# Patient Record
Sex: Male | Born: 1970
Health system: Southern US, Community
[De-identification: ages and names within clinical notes are randomized; demographics above are authoritative.]

## PROBLEM LIST (undated history)

## (undated) DIAGNOSIS — G8929 Other chronic pain: Secondary | ICD-10-CM

## (undated) DIAGNOSIS — Z87442 Personal history of urinary calculi: Secondary | ICD-10-CM

## (undated) DIAGNOSIS — E215 Disorder of parathyroid gland, unspecified: Secondary | ICD-10-CM

## (undated) DIAGNOSIS — I10 Essential (primary) hypertension: Secondary | ICD-10-CM

## (undated) DIAGNOSIS — R519 Headache, unspecified: Secondary | ICD-10-CM

## (undated) DIAGNOSIS — K219 Gastro-esophageal reflux disease without esophagitis: Secondary | ICD-10-CM

## (undated) DIAGNOSIS — Z9221 Personal history of antineoplastic chemotherapy: Secondary | ICD-10-CM

## (undated) DIAGNOSIS — D649 Anemia, unspecified: Secondary | ICD-10-CM

## (undated) DIAGNOSIS — K5900 Constipation, unspecified: Secondary | ICD-10-CM

## (undated) DIAGNOSIS — R011 Cardiac murmur, unspecified: Secondary | ICD-10-CM

## (undated) DIAGNOSIS — T7840XA Allergy, unspecified, initial encounter: Secondary | ICD-10-CM

## (undated) DIAGNOSIS — N189 Chronic kidney disease, unspecified: Secondary | ICD-10-CM

## (undated) DIAGNOSIS — E079 Disorder of thyroid, unspecified: Secondary | ICD-10-CM

## (undated) DIAGNOSIS — R51 Headache: Secondary | ICD-10-CM

## (undated) DIAGNOSIS — IMO0001 Reserved for inherently not codable concepts without codable children: Secondary | ICD-10-CM

## (undated) DIAGNOSIS — I2510 Atherosclerotic heart disease of native coronary artery without angina pectoris: Secondary | ICD-10-CM

## (undated) HISTORY — DX: Other chronic pain: G89.29

## (undated) HISTORY — DX: Anemia, unspecified: D64.9

## (undated) HISTORY — DX: Personal history of urinary calculi: Z87.442

## (undated) HISTORY — PX: AV FISTULA PLACEMENT: SHX1204

## (undated) HISTORY — DX: Chronic kidney disease, unspecified: N18.9

## (undated) HISTORY — DX: Disorder of thyroid, unspecified: E07.9

## (undated) HISTORY — DX: Cardiac murmur, unspecified: R01.1

## (undated) HISTORY — DX: Headache: R51

## (undated) HISTORY — DX: Allergy, unspecified, initial encounter: T78.40XA

## (undated) HISTORY — DX: Personal history of antineoplastic chemotherapy: Z92.21

## (undated) HISTORY — DX: Headache, unspecified: R51.9

## (undated) HISTORY — DX: Essential (primary) hypertension: I10

## (undated) HISTORY — PX: OTHER SURGICAL HISTORY: SHX169

---

## 2008-02-18 ENCOUNTER — Emergency Department (HOSPITAL_COMMUNITY): Admission: EM | Admit: 2008-02-18 | Discharge: 2008-02-18 | Payer: Self-pay | Admitting: Family Medicine

## 2009-03-18 ENCOUNTER — Encounter (INDEPENDENT_AMBULATORY_CARE_PROVIDER_SITE_OTHER): Payer: Self-pay | Admitting: Interventional Radiology

## 2009-03-18 ENCOUNTER — Ambulatory Visit (HOSPITAL_COMMUNITY): Admission: RE | Admit: 2009-03-18 | Discharge: 2009-03-18 | Payer: Self-pay | Admitting: Nephrology

## 2010-10-09 LAB — CBC
MCV: 95 fL (ref 78.0–100.0)
Platelets: 287 10*3/uL (ref 150–400)
RDW: 12.7 % (ref 11.5–15.5)
WBC: 9.2 10*3/uL (ref 4.0–10.5)

## 2010-10-09 LAB — PROTIME-INR: Prothrombin Time: 13.2 seconds (ref 11.6–15.2)

## 2010-10-23 IMAGING — US US BIOPSY
1 series · 10 of 10 positions shown · non-contrast
Comparison: none

Clinical Data/Indication: Proteinuria

ULTRASOUND-GUIDED BIOPSYRANDOM RENAL CORE
Sedation: Versed one mg, Fentanyl 50 mcg.
Total Moderate Sedation Time: A minutes.
Additional Medications: .
Procedure: The procedure, risks, benefits, and alternatives were
explained to the patient. Questions regarding the procedure were
encouraged and answered. The patient understands and consents to
the procedure.
The back was prepped with betadine in a sterile fashion, and a
sterile drape was applied covering the operative field. A sterile
gown and sterile gloves were used for the procedure.
Under sonographic guidance, two 16 gauge core biopsies were
obtained from the lower pole cortex of the left kidney. The guide
needle was removed. Final imaging was performed.
Patient tolerated the procedure well without complication.  Vital
sign monitoring by nursing staff during the procedure will continue
as patient is in the special procedures unit for post procedure
observation.

[Series 1: us biopsy · 0.28mm/px · 10 of 10 slices shown]
[im 1/10]
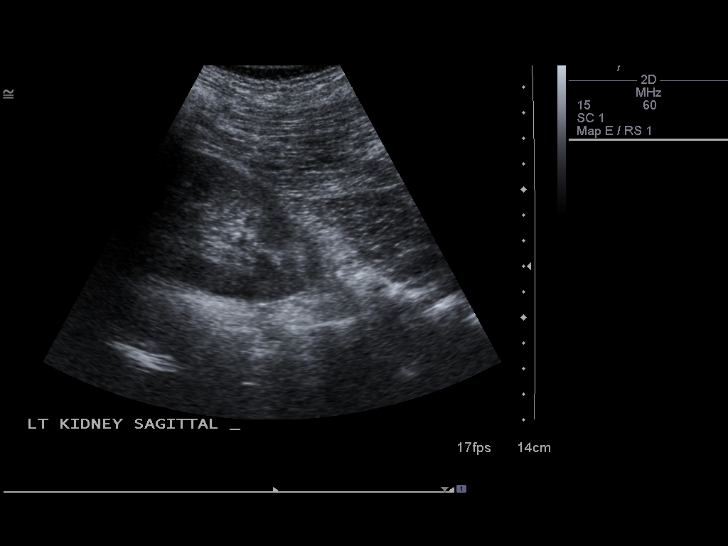
[im 2/10]
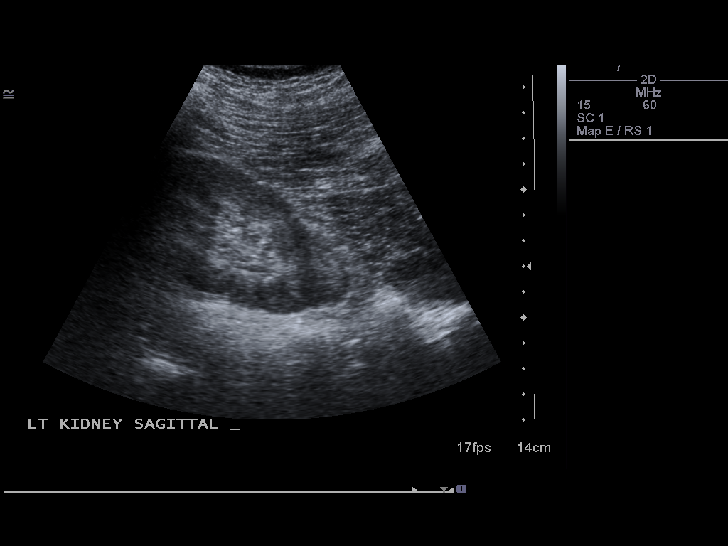
[im 3/10]
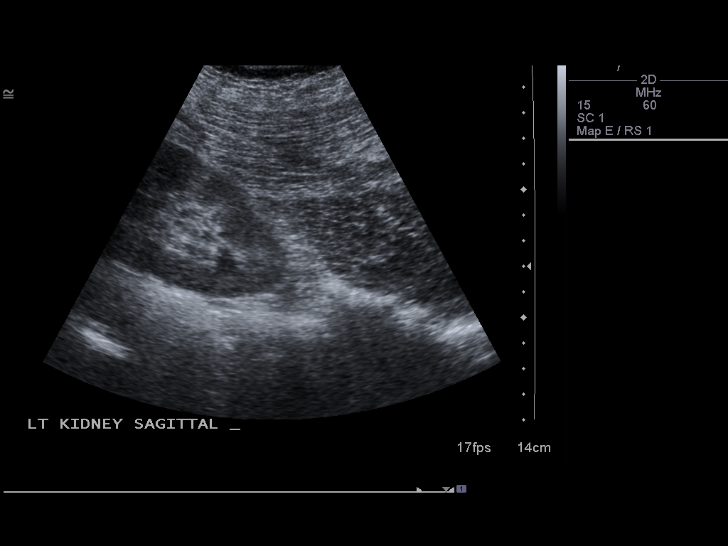
[im 4/10]
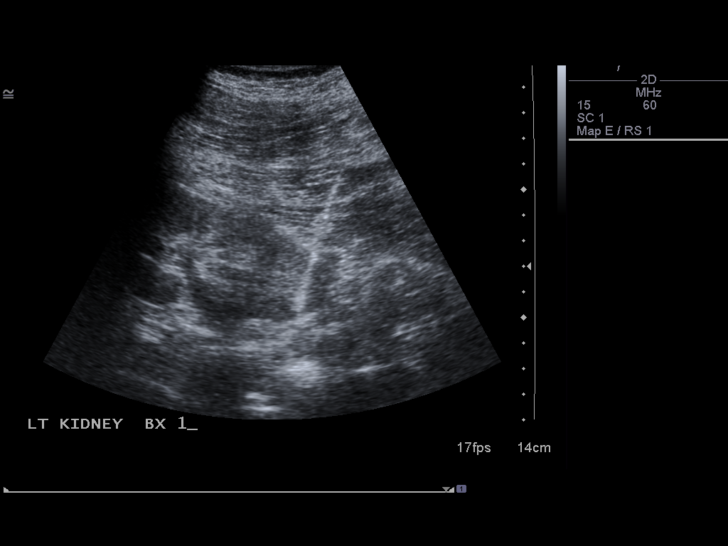
[im 5/10]
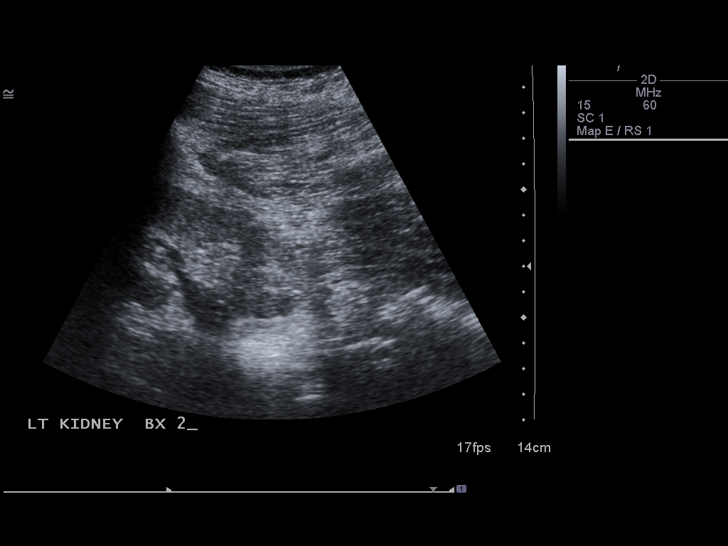
[im 6/10]
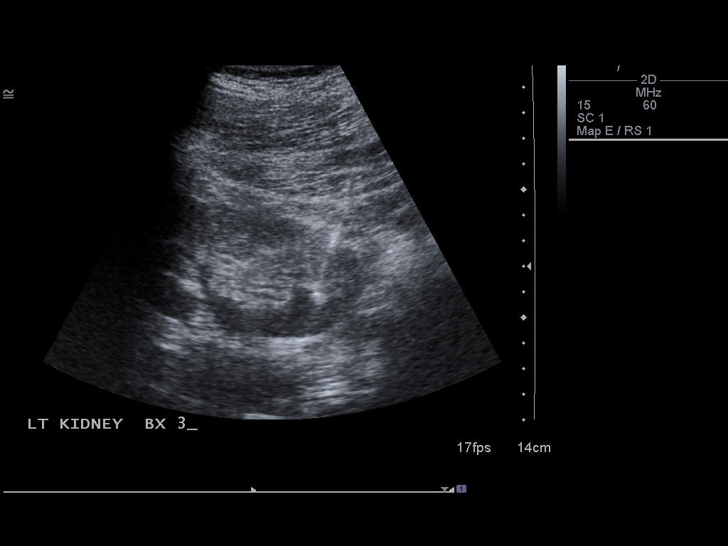
[im 7/10]
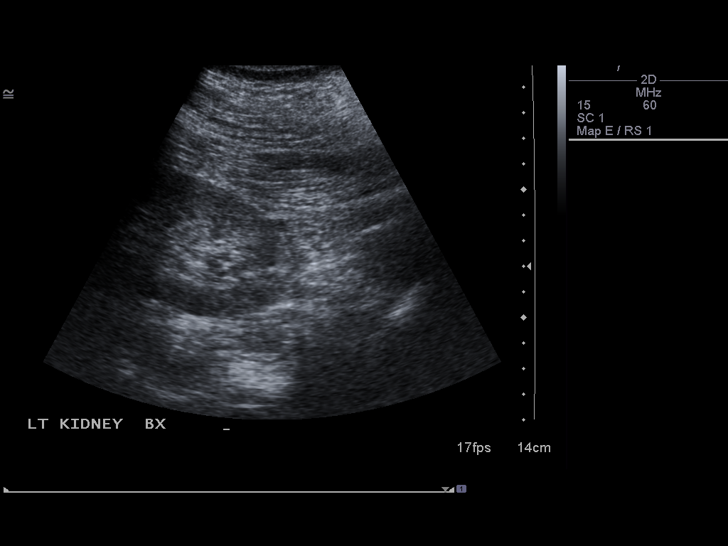
[im 8/10]
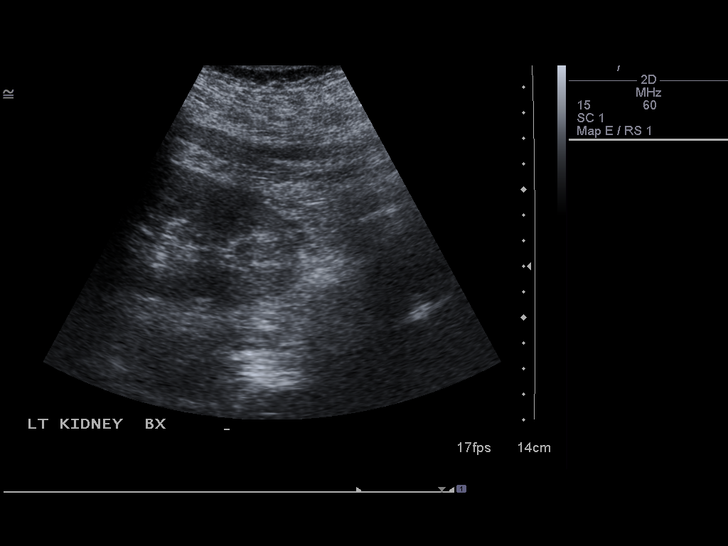
[im 9/10]
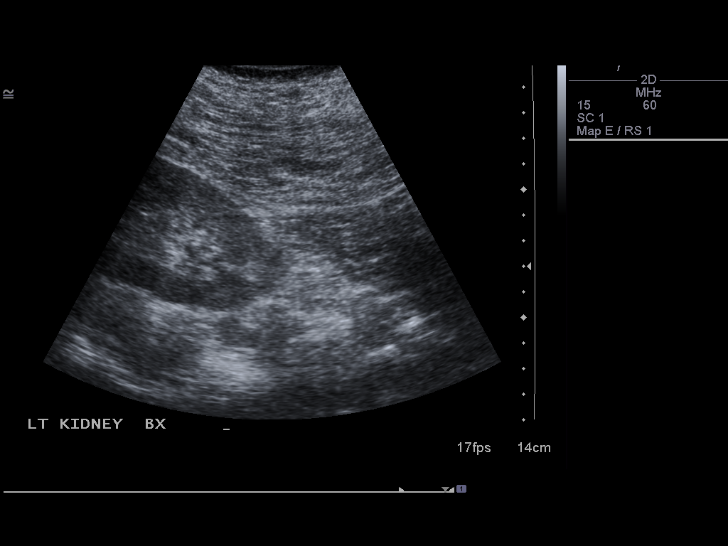
[im 10/10]
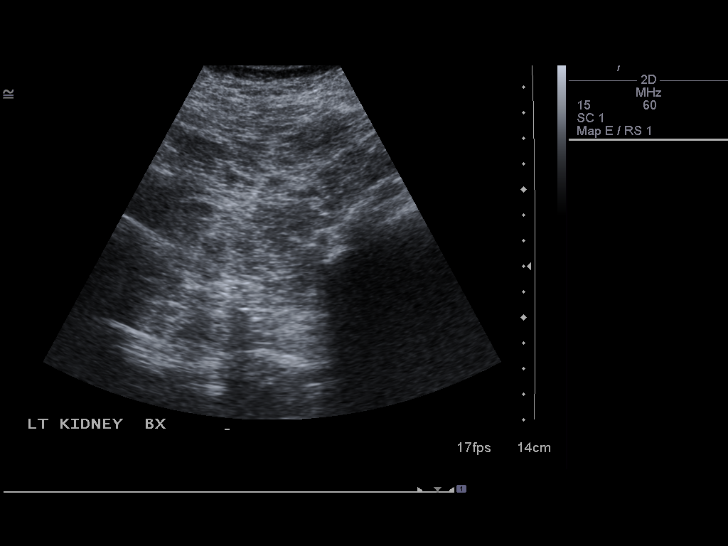

[10 of 10 positions shown; findings below may reference images not displayed]

FINDINGS: The images document guide needle placement within the
lower pole cortex of the left kidney. Post biopsy images
demonstrate minimal perinephric hemorrhage.
IMPRESSION: Successful ultrasound-guided random renal core biopsy.

## 2011-06-08 ENCOUNTER — Encounter: Payer: Self-pay | Admitting: Thoracic Diseases

## 2011-06-08 ENCOUNTER — Encounter: Payer: Self-pay | Admitting: Vascular Surgery

## 2011-06-09 ENCOUNTER — Ambulatory Visit (INDEPENDENT_AMBULATORY_CARE_PROVIDER_SITE_OTHER): Payer: BC Managed Care – PPO | Admitting: Thoracic Diseases

## 2011-06-09 ENCOUNTER — Encounter: Payer: Self-pay | Admitting: Thoracic Diseases

## 2011-06-09 VITALS — BP 151/90 | HR 105 | Resp 18 | Ht 74.0 in | Wt 175.0 lb

## 2011-06-09 DIAGNOSIS — N186 End stage renal disease: Secondary | ICD-10-CM | POA: Insufficient documentation

## 2011-06-09 DIAGNOSIS — Z992 Dependence on renal dialysis: Secondary | ICD-10-CM

## 2011-06-09 NOTE — Progress Notes (Signed)
[facility]  H&P NOTE 06/09/2011 [id number]  OZ:[id number] button hole site on AVF Referring Physician:Dr. [facility] History of Present Illness: Christopher Ritter is a 40 y.o. male who has a functioning left brachio cephalic AVF which was placed at the beginning of this year. They have been using the button hole technique to access the fistula.   approx 2-3 days ago the upper site continued to swell. It has had a 2 mm scab over it but has not been bleeding. They have been accessing through this site and it is now enlarging. We will be revising this area to prevent further bleeding which could be  life threatening .   Past Medical History  Diagnosis Date  . Hypertension   . Chronic kidney disease   . Chronic headaches     posterior occipital headaches  . Heart murmur   . ED (erectile dysfunction)   . History of renal calculi   . Allergy   . Anemia     Iron deficiency  . Thyroid disease   . History of Cytoxan exposure     ROS: [x]  Positive   [ ]  Negative   [ ]  All sytems reviewed and are negative  General: [ ]  Weight loss, [ ]  Fever, [ ]  chills Neurologic: [ ]  Dizziness, [ ]  Blackouts, [ ]  Seizure [ ]  Stroke, [ ]  "Mini stroke", [ ]  Slurred speech, [ ]  Temporary blindness; [ ]  weakness in arms or legs, [ ]  Hoarseness Cardiac: [ ]  Chest pain/pressure, [ ]  Shortness of breath at rest [ ]  Shortness of breath with exertion, [ ]  Atrial fibrillation or irregular heartbeat Vascular: [ ]  Pain in legs with walking, [ ]  Pain in legs at rest, [ ]  Pain in legs at night,  [ ]  Non-healing ulcer, [ ]  Blood clot in vein/DVT,   Pulmonary: [ ]  Home oxygen, [ ]  Productive cough, [ ]  Coughing up blood, [ ]  Asthma,  [ ]  Wheezing Musculoskeletal:  [ ]  Arthritis, [ ]  Low back pain, [ ]  Joint pain Hematologic: [ ]  Easy Bruising, [ ]  Anemia; [ ]  Hepatitis Gastrointestinal: [ ]  Blood in stool, [ ]  Gastroesophageal Reflux/heartburn, [ ]  Trouble  swallowing Urinary: [x ] chronic Kidney disease, [x ] on HD - [x ] MWF or [ ]  TTHS, [ ]  Burning with urination, [ ]  Difficulty urinating Skin: [ ]  Rashes, [ ]  Wounds Psychological: [ ]  Anxiety, [ ]  Depression  Social History History  Substance Use Topics  . Smoking status: Never Smoker   . Smokeless tobacco: Never Used  . Alcohol Use: Yes    Family History No family history on file.  Allergies  Allergen Reactions  . Lisinopril Cough    Current Outpatient Prescriptions  Medication Sig Dispense Refill  . amLODipine (NORVASC) 10 MG tablet Take 10 mg by mouth daily.        Marland Kitchen b complex-vitamin c-folic acid (NEPHRO-VITE) 0.8 MG TABS Take 0.8 mg by mouth at bedtime.        . calcium acetate (PHOSLO) 667 MG capsule Take 667 mg by mouth 3 (three) times daily with meals.        Marland Kitchen ethyl chloride spray Apply topically as needed.        Marland Kitchen HYDROcodone-acetaminophen (VICODIN) 5-500 MG per tablet Take 1 tablet by mouth every 6 (six) hours as needed.        Marland Kitchen losartan (COZAAR) 50 MG tablet Take 50 mg by mouth daily.        Marland Kitchen  Omega-3 Fatty Acids (FISH OIL) 1000 MG CAPS Take 1,000 mg by mouth daily.        Marland Kitchen oxyCODONE (OXYCONTIN) 15 MG TB12 Take 15 mg by mouth 2 (two) times daily.        . simvastatin (ZOCOR) 20 MG tablet Take 20 mg by mouth at bedtime.        . naproxen sodium (ANAPROX) 220 MG tablet Take 220 mg by mouth 2 (two) times daily with a meal.          Significant Diagnostic Studies: CBC    Component Value Date/Time   WBC 9.2 03/18/2009 0826   RBC 3.62* 03/18/2009 0826   HGB 11.7* 03/18/2009 0826   HCT 34.4* 03/18/2009 0826   PLT 287 03/18/2009 0826   MCV 95.0 03/18/2009 0826   MCHC 34.0 03/18/2009 0826   RDW 12.7 03/18/2009 0826     COAG Lab Results  Component Value Date   INR 1.0 03/18/2009   No results found for this basename: PTT     Physical Examination  Filed Vitals:   06/09/11 1522  BP: 151/90  Pulse: 105  Resp: 18  Height: 6\' 2"  (1.88 m)  Weight: 175 lb  (79.379 kg)    General:  WDWN in NAD Gait: Normal HENT: WNL Eyes: Pupils equal Pulmonary: normal non-labored breathing , without Rales, rhonchi,  wheezing Cardiac: RRR, with 4/6 SEM  Abdomen: soft, NT, no masses Skin: no rashes, ulcers noted Vascular Exam/Pulses:2 + radial pulse LUE Good thrill and bruit in the AVF, LUE  There is a 2x2 mm scab over proximal button hole site. There is no bleeding and the scab remains intact Extremities without ischemic changes, no Gangrene , no cellulitis; no open wounds;  Musculoskeletal: no muscle wasting or atrophy  Neurologic: A&O X 3; Appropriate Affect ;  SENSATION: normal; MOTOR FUNCTION:  moving all extremities equally.  Speech is fluent/normal   ASSESSMENT: pulsitile area with large scab in left Brachiocephalic AVF secondary to repeated cannulation through button hole technique PLAN: Revise area of upper left BC -AVF  Clinic MD: CS Edilia Bo, MD

## 2011-06-10 ENCOUNTER — Other Ambulatory Visit: Payer: Self-pay | Admitting: *Deleted

## 2011-06-16 MED ORDER — DEXTROSE 5 % IV SOLN
1.5000 g | INTRAVENOUS | Status: DC
  Filled 2011-06-16: qty 1.5

## 2011-06-16 MED ORDER — SODIUM CHLORIDE 0.9 % IV SOLN: INTRAVENOUS | Status: DC

## 2011-06-16 NOTE — Progress Notes (Signed)
Left message for Darel Hong at Dr. Adele Dan office requesting verification that surgery is cancelled. Per pharmacy tech's note on 12/11 pt states surgery was cancelled.

## 2011-06-17 ENCOUNTER — Ambulatory Visit (HOSPITAL_COMMUNITY)
Admission: RE | Admit: 2011-06-17 | Discharge: 2011-06-17 | Disposition: A | Discharge status: Home / Self Care | Payer: BC Managed Care – PPO | Source: Ambulatory Visit | Attending: Vascular Surgery | Admitting: Vascular Surgery

## 2011-06-17 ENCOUNTER — Encounter (HOSPITAL_COMMUNITY)
Admission: RE | Disposition: A | Discharge status: Home / Self Care | Payer: Self-pay | Source: Ambulatory Visit | Attending: Vascular Surgery

## 2011-06-17 ENCOUNTER — Ambulatory Visit: Payer: Self-pay | Admitting: Vascular Surgery

## 2011-06-17 SURGERY — ARTERIOVENOUS (AV) FISTULA CREATION
Anesthesia: Monitor Anesthesia Care | Site: Arm Upper | Laterality: Left

## 2011-06-21 ENCOUNTER — Encounter: Payer: Self-pay | Admitting: Vascular Surgery

## 2011-06-22 ENCOUNTER — Ambulatory Visit: Payer: BC Managed Care – PPO | Admitting: Vascular Surgery

## 2011-06-23 ENCOUNTER — Other Ambulatory Visit: Payer: Self-pay | Admitting: *Deleted

## 2011-06-23 ENCOUNTER — Encounter (HOSPITAL_COMMUNITY): Payer: Self-pay

## 2011-07-07 ENCOUNTER — Encounter (HOSPITAL_COMMUNITY): Payer: Self-pay | Admitting: *Deleted

## 2011-07-08 ENCOUNTER — Ambulatory Visit (HOSPITAL_COMMUNITY): Payer: BC Managed Care – PPO

## 2011-07-08 ENCOUNTER — Ambulatory Visit (HOSPITAL_COMMUNITY)
Admission: RE | Admit: 2011-07-08 | Discharge: 2011-07-08 | Disposition: A | Discharge status: Home / Self Care | Payer: BC Managed Care – PPO | Source: Ambulatory Visit | Attending: Vascular Surgery | Admitting: Vascular Surgery

## 2011-07-08 ENCOUNTER — Encounter (HOSPITAL_COMMUNITY): Payer: Self-pay | Admitting: Critical Care Medicine

## 2011-07-08 ENCOUNTER — Ambulatory Visit (HOSPITAL_COMMUNITY): Payer: BC Managed Care – PPO | Admitting: Critical Care Medicine

## 2011-07-08 ENCOUNTER — Encounter (HOSPITAL_COMMUNITY): Payer: Self-pay | Admitting: *Deleted

## 2011-07-08 ENCOUNTER — Encounter (HOSPITAL_COMMUNITY)
Admission: RE | Disposition: A | Discharge status: Home / Self Care | Payer: Self-pay | Source: Ambulatory Visit | Attending: Vascular Surgery

## 2011-07-08 ENCOUNTER — Other Ambulatory Visit: Payer: Self-pay

## 2011-07-08 DIAGNOSIS — T82898A Other specified complication of vascular prosthetic devices, implants and grafts, initial encounter: Secondary | ICD-10-CM | POA: Insufficient documentation

## 2011-07-08 DIAGNOSIS — D649 Anemia, unspecified: Secondary | ICD-10-CM | POA: Diagnosis not present

## 2011-07-08 DIAGNOSIS — Z992 Dependence on renal dialysis: Secondary | ICD-10-CM

## 2011-07-08 DIAGNOSIS — Y832 Surgical operation with anastomosis, bypass or graft as the cause of abnormal reaction of the patient, or of later complication, without mention of misadventure at the time of the procedure: Secondary | ICD-10-CM | POA: Insufficient documentation

## 2011-07-08 DIAGNOSIS — T82897A Other specified complication of cardiac prosthetic devices, implants and grafts, initial encounter: Secondary | ICD-10-CM | POA: Diagnosis not present

## 2011-07-08 DIAGNOSIS — I120 Hypertensive chronic kidney disease with stage 5 chronic kidney disease or end stage renal disease: Secondary | ICD-10-CM | POA: Insufficient documentation

## 2011-07-08 DIAGNOSIS — N186 End stage renal disease: Secondary | ICD-10-CM | POA: Insufficient documentation

## 2011-07-08 DIAGNOSIS — I10 Essential (primary) hypertension: Secondary | ICD-10-CM | POA: Diagnosis not present

## 2011-07-08 HISTORY — PX: THROMBECTOMY W/ EMBOLECTOMY: SHX2507

## 2011-07-08 HISTORY — DX: Atherosclerotic heart disease of native coronary artery without angina pectoris: I25.10

## 2011-07-08 HISTORY — DX: Gastro-esophageal reflux disease without esophagitis: K21.9

## 2011-07-08 LAB — POCT I-STAT 4, (NA,K, GLUC, HGB,HCT)
Glucose, Bld: 99 mg/dL (ref 70–99)
HCT: 39 % (ref 39.0–52.0)
Hemoglobin: 13.3 g/dL (ref 13.0–17.0)
Potassium: 4.6 mEq/L (ref 3.5–5.1)
Sodium: 139 mEq/L (ref 135–145)

## 2011-07-08 LAB — PROTIME-INR: Prothrombin Time: 13.6 seconds (ref 11.6–15.2)

## 2011-07-08 LAB — SURGICAL PCR SCREEN: Staphylococcus aureus: NEGATIVE

## 2011-07-08 SURGERY — THROMBECTOMY ARTERIOVENOUS FISTULA
Anesthesia: Monitor Anesthesia Care | Site: Arm Upper | Laterality: Left

## 2011-07-08 MED ORDER — SODIUM CHLORIDE 0.9 % IV SOLN
INTRAVENOUS | Status: DC | PRN
  Administered 2011-07-08: 07:00:00 via INTRAVENOUS

## 2011-07-08 MED ORDER — MUPIROCIN 2 % EX OINT
TOPICAL_OINTMENT | CUTANEOUS | Status: AC
  Filled 2011-07-08: qty 22

## 2011-07-08 MED ORDER — ONDANSETRON HCL 4 MG/2ML IJ SOLN
INTRAMUSCULAR | Status: DC | PRN
  Administered 2011-07-08: 4 mg via INTRAVENOUS

## 2011-07-08 MED ORDER — MIDAZOLAM HCL 2 MG/2ML IJ SOLN: 0.5000 mg | Freq: Once | INTRAMUSCULAR | Status: DC | PRN

## 2011-07-08 MED ORDER — MEPERIDINE HCL 25 MG/ML IJ SOLN: 6.2500 mg | INTRAMUSCULAR | Status: DC | PRN

## 2011-07-08 MED ORDER — GLYCOPYRROLATE 0.2 MG/ML IJ SOLN
INTRAMUSCULAR | Status: DC | PRN
  Administered 2011-07-08: 0.1 mg via INTRAVENOUS

## 2011-07-08 MED ORDER — MORPHINE SULFATE 2 MG/ML IJ SOLN: 0.0500 mg/kg | INTRAMUSCULAR | Status: DC | PRN

## 2011-07-08 MED ORDER — HYDROCODONE-ACETAMINOPHEN 5-500 MG PO TABS: 1.0000 | ORAL_TABLET | ORAL | Status: DC | PRN

## 2011-07-08 MED ORDER — CEFAZOLIN SODIUM 1-5 GM-% IV SOLN
INTRAVENOUS | Status: DC | PRN
  Administered 2011-07-08: 1 g via INTRAVENOUS

## 2011-07-08 MED ORDER — PROMETHAZINE HCL 25 MG/ML IJ SOLN: 6.2500 mg | INTRAMUSCULAR | Status: DC | PRN

## 2011-07-08 MED ORDER — SODIUM CHLORIDE 0.9 % IR SOLN
Status: DC | PRN
  Administered 2011-07-08: 1000 mL

## 2011-07-08 MED ORDER — PROPOFOL 10 MG/ML IV EMUL
INTRAVENOUS | Status: DC | PRN
  Administered 2011-07-08: 100 ug/kg/min via INTRAVENOUS

## 2011-07-08 MED ORDER — SODIUM CHLORIDE 0.9 % IR SOLN
Status: DC | PRN
  Administered 2011-07-08: 08:00:00

## 2011-07-08 MED ORDER — LIDOCAINE-EPINEPHRINE (PF) 1 %-1:200000 IJ SOLN
INTRAMUSCULAR | Status: DC | PRN
  Administered 2011-07-08: 12 mL

## 2011-07-08 MED ORDER — HYDROMORPHONE HCL PF 1 MG/ML IJ SOLN: 0.2500 mg | INTRAMUSCULAR | Status: DC | PRN

## 2011-07-08 MED ORDER — HYDROCODONE-ACETAMINOPHEN 5-325 MG PO TABS: 1.0000 | ORAL_TABLET | ORAL | Status: DC | PRN

## 2011-07-08 MED ORDER — MIDAZOLAM HCL 5 MG/5ML IJ SOLN
INTRAMUSCULAR | Status: DC | PRN
  Administered 2011-07-08: 2 mg via INTRAVENOUS

## 2011-07-08 MED ORDER — FENTANYL CITRATE 0.05 MG/ML IJ SOLN
INTRAMUSCULAR | Status: DC | PRN
  Administered 2011-07-08 (×2): 50 ug via INTRAVENOUS

## 2011-07-08 MED ORDER — CEFAZOLIN SODIUM 1-5 GM-% IV SOLN
INTRAVENOUS | Status: AC
  Filled 2011-07-08: qty 50

## 2011-07-08 SURGICAL SUPPLY — 39 items
CANISTER SUCTION 2500CC (MISCELLANEOUS) ×2 IMPLANT
CATH EMB 5FR 80CM (CATHETERS) ×2 IMPLANT
CLIP TI MEDIUM 6 (CLIP) ×2 IMPLANT
CLIP TI WIDE RED SMALL 6 (CLIP) ×2 IMPLANT
CLOTH BEACON ORANGE TIMEOUT ST (SAFETY) ×2 IMPLANT
COVER SURGICAL LIGHT HANDLE (MISCELLANEOUS) ×4 IMPLANT
DERMABOND ADHESIVE PROPEN (GAUZE/BANDAGES/DRESSINGS) ×1
DERMABOND ADVANCED (GAUZE/BANDAGES/DRESSINGS) ×1
DERMABOND ADVANCED .7 DNX12 (GAUZE/BANDAGES/DRESSINGS) ×1 IMPLANT
DERMABOND ADVANCED .7 DNX6 (GAUZE/BANDAGES/DRESSINGS) ×1 IMPLANT
ELECT REM PT RETURN 9FT ADLT (ELECTROSURGICAL) ×2
ELECTRODE REM PT RTRN 9FT ADLT (ELECTROSURGICAL) ×1 IMPLANT
GEL ULTRASOUND 20GR AQUASONIC (MISCELLANEOUS) IMPLANT
GLOVE BIO SURGEON STRL SZ 6.5 (GLOVE) ×2 IMPLANT
GLOVE BIOGEL PI IND STRL 6.5 (GLOVE) ×2 IMPLANT
GLOVE BIOGEL PI IND STRL 7.0 (GLOVE) ×3 IMPLANT
GLOVE BIOGEL PI IND STRL 7.5 (GLOVE) ×1 IMPLANT
GLOVE BIOGEL PI INDICATOR 6.5 (GLOVE) ×2
GLOVE BIOGEL PI INDICATOR 7.0 (GLOVE) ×3
GLOVE BIOGEL PI INDICATOR 7.5 (GLOVE) ×1
GLOVE SS BIOGEL STRL SZ 7 (GLOVE) ×1 IMPLANT
GLOVE SUPERSENSE BIOGEL SZ 7 (GLOVE) ×1
GLOVE SURG SS PI 7.5 STRL IVOR (GLOVE) ×2 IMPLANT
GOWN PREVENTION PLUS XLARGE (GOWN DISPOSABLE) ×4 IMPLANT
GOWN STRL NON-REIN LRG LVL3 (GOWN DISPOSABLE) ×6 IMPLANT
KIT BASIN OR (CUSTOM PROCEDURE TRAY) ×2 IMPLANT
KIT ROOM TURNOVER OR (KITS) ×2 IMPLANT
NS IRRIG 1000ML POUR BTL (IV SOLUTION) ×2 IMPLANT
PACK CV ACCESS (CUSTOM PROCEDURE TRAY) ×2 IMPLANT
PAD ARMBOARD 7.5X6 YLW CONV (MISCELLANEOUS) ×4 IMPLANT
SPONGE GAUZE 4X4 12PLY (GAUZE/BANDAGES/DRESSINGS) IMPLANT
SUT PROLENE 6 0 BV (SUTURE) ×2 IMPLANT
SUT VIC AB 3-0 SH 27 (SUTURE) ×2
SUT VIC AB 3-0 SH 27X BRD (SUTURE) ×2 IMPLANT
SUT VICRYL 4-0 PS2 18IN ABS (SUTURE) ×2 IMPLANT
TOWEL OR 17X24 6PK STRL BLUE (TOWEL DISPOSABLE) ×2 IMPLANT
TOWEL OR 17X26 10 PK STRL BLUE (TOWEL DISPOSABLE) ×2 IMPLANT
UNDERPAD 30X30 INCONTINENT (UNDERPADS AND DIAPERS) ×2 IMPLANT
WATER STERILE IRR 1000ML POUR (IV SOLUTION) ×2 IMPLANT

## 2011-07-08 NOTE — Op Note (Signed)
OPERATIVE REPORT  Date of Surgery: 07/08/2011  Surgeon: Josephina Gip, MD  Assistant: Della Goo  Pre-op Diagnosis: End Stage Renal Disease with ulcerated lesion overlying functioning left upper arm AV fistula Post-op Diagnosis: Same Procedure: Procedure(s) Excision of ulcerated skin overlying functioning left upper arm AV fistula with revision of fistula Anesthesia:   EBL: Minimal  Complications: None  Procedure Details: Patient was taken to the operating room placed in supine position at which time the left upper extremity was prepped with Betadine scrub and solution and draped in routine sterile manner. There was an ulcerated lesion overlying the most proximal portion of the left upper arm AV fistula (brachial/cephalic). This ulcerated area was approximately 7 mm in diameter and was pulsatile. After infiltration with 1% solid and epinephrine both proximal and distal to this area a short incision was made proximal control of the fistula. Similarly distal control was obtained an elliptical incision was made around this ulcerated area which was not infected nor purulent in appearance with no erythema. This was dissected down to the fistula. There was a well-formed track up to the skin level. The fistula was occluded proximally and distally with vascular clamps and this  fistulous tract was excised and the lateral wall of the fistula repaired with 2 layers of 6-0 Prolene. Clamps were then released and there was an excellent pulse and thrill remaining in the fistula the skin was then mobilized medially and laterally and was easily reapproximated with interrupted 3-0 Vicryl and oral Vicryl in subcuticular fashion with Dermabond. Patient taken to recovery in stable condition  Josephina Gip, MD 07/08/2011 8:41 AM        2

## 2011-07-08 NOTE — H&P (View-Only) (Signed)
[facility] [place] H&P NOTE 06/09/2011 10/23/1970 [id number]  CC:Enlarging button hole site on AVF Referring Physician:Dr. Dunham [facility] History of Present Illness: Christopher Ritter is a 40 y.o. male who has a functioning left brachio cephalic AVF which was placed at the beginning of this year. They have been using the button hole technique to access the fistula.   approx 2-3 days ago the upper site continued to swell. It has had a 2 mm scab over it but has not been bleeding. They have been accessing through this site and it is now enlarging. We will be revising this area to prevent further bleeding which could be  life threatening .   Past Medical History  Diagnosis Date  . Hypertension   . Chronic kidney disease   . Chronic headaches     posterior occipital headaches  . Heart murmur   . ED (erectile dysfunction)   . History of renal calculi   . Allergy   . Anemia     Iron deficiency  . Thyroid disease   . History of Cytoxan exposure     ROS: [x] Positive   [ ] Negative   [ ] All sytems reviewed and are negative  General: [ ] Weight loss, [ ] Fever, [ ] chills Neurologic: [ ] Dizziness, [ ] Blackouts, [ ] Seizure [ ] Stroke, [ ] "Mini stroke", [ ] Slurred speech, [ ] Temporary blindness; [ ] weakness in arms or legs, [ ] Hoarseness Cardiac: [ ] Chest pain/pressure, [ ] Shortness of breath at rest [ ] Shortness of breath with exertion, [ ] Atrial fibrillation or irregular heartbeat Vascular: [ ] Pain in legs with walking, [ ] Pain in legs at rest, [ ] Pain in legs at night,  [ ] Non-healing ulcer, [ ] Blood clot in vein/DVT,   Pulmonary: [ ] Home oxygen, [ ] Productive cough, [ ] Coughing up blood, [ ] Asthma,  [ ] Wheezing Musculoskeletal:  [ ] Arthritis, [ ] Low back pain, [ ] Joint pain Hematologic: [ ] Easy Bruising, [ ] Anemia; [ ] Hepatitis Gastrointestinal: [ ] Blood in stool, [ ] Gastroesophageal Reflux/heartburn, [ ] Trouble  swallowing Urinary: [x ] chronic Kidney disease, [x ] on HD - [x ] MWF or [ ] TTHS, [ ] Burning with urination, [ ] Difficulty urinating Skin: [ ] Rashes, [ ] Wounds Psychological: [ ] Anxiety, [ ] Depression  Social History History  Substance Use Topics  . Smoking status: Never Smoker   . Smokeless tobacco: Never Used  . Alcohol Use: Yes    Family History No family history on file.  Allergies  Allergen Reactions  . Lisinopril Cough    Current Outpatient Prescriptions  Medication Sig Dispense Refill  . amLODipine (NORVASC) 10 MG tablet Take 10 mg by mouth daily.        . b complex-vitamin c-folic acid (NEPHRO-VITE) 0.8 MG TABS Take 0.8 mg by mouth at bedtime.        . calcium acetate (PHOSLO) 667 MG capsule Take 667 mg by mouth 3 (three) times daily with meals.        . ethyl chloride spray Apply topically as needed.        . HYDROcodone-acetaminophen (VICODIN) 5-500 MG per tablet Take 1 tablet by mouth every 6 (six) hours as needed.        . losartan (COZAAR) 50 MG tablet Take 50 mg by mouth daily.        .   Omega-3 Fatty Acids (FISH OIL) 1000 MG CAPS Take 1,000 mg by mouth daily.        . oxyCODONE (OXYCONTIN) 15 MG TB12 Take 15 mg by mouth 2 (two) times daily.        . simvastatin (ZOCOR) 20 MG tablet Take 20 mg by mouth at bedtime.        . naproxen sodium (ANAPROX) 220 MG tablet Take 220 mg by mouth 2 (two) times daily with a meal.          Significant Diagnostic Studies: CBC    Component Value Date/Time   WBC 9.2 03/18/2009 0826   RBC 3.62* 03/18/2009 0826   HGB 11.7* 03/18/2009 0826   HCT 34.4* 03/18/2009 0826   PLT 287 03/18/2009 0826   MCV 95.0 03/18/2009 0826   MCHC 34.0 03/18/2009 0826   RDW 12.7 03/18/2009 0826     COAG Lab Results  Component Value Date   INR 1.0 03/18/2009   No results found for this basename: PTT     Physical Examination  Filed Vitals:   06/09/11 1522  BP: 151/90  Pulse: 105  Resp: 18  Height: 6' 2" (1.88 m)  Weight: 175 lb  (79.379 kg)    General:  WDWN in NAD Gait: Normal HENT: WNL Eyes: Pupils equal Pulmonary: normal non-labored breathing , without Rales, rhonchi,  wheezing Cardiac: RRR, with 4/6 SEM  Abdomen: soft, NT, no masses Skin: no rashes, ulcers noted Vascular Exam/Pulses:2 + radial pulse LUE Good thrill and bruit in the AVF, LUE  There is a 2x2 mm scab over proximal button hole site. There is no bleeding and the scab remains intact Extremities without ischemic changes, no Gangrene , no cellulitis; no open wounds;  Musculoskeletal: no muscle wasting or atrophy  Neurologic: A&O X 3; Appropriate Affect ;  SENSATION: normal; MOTOR FUNCTION:  moving all extremities equally.  Speech is fluent/normal   ASSESSMENT: pulsitile area with large scab in left Brachiocephalic AVF secondary to repeated cannulation through button hole technique PLAN: Revise area of upper left BC -AVF  Clinic MD: CS Dickson, MD 

## 2011-07-08 NOTE — Interval H&P Note (Signed)
History and Physical Interval Note:  07/08/2011 7:36 AM  Christopher Ritter  has presented today for surgery, with the diagnosis of ESRD  The various methods of treatment have been discussed with the patient and family. After consideration of risks, benefits and other options for treatment, the patient has consented to  Procedure(s): THROMBECTOMY ARTERIOVENOUS FISTULA as a surgical intervention .  The patients' history has been reviewed, patient examined, no change in status, stable for surgery.  I have reviewed the patients' chart and labs.  Questions were answered to the patient's satisfaction.     LAWSON,JAMES D

## 2011-07-08 NOTE — Transfer of Care (Signed)
Immediate Anesthesia Transfer of Care Note  Patient: Christopher Ritter  Procedure(s) Performed:  THROMBECTOMY ARTERIOVENOUS FISTULA - Incision of Ulcerated Skin and Revision of Left Upper Arm Fistula  Patient Location: PACU  Anesthesia Type: MAC  Level of Consciousness: awake, alert  and oriented  Airway & Oxygen Therapy: Patient Spontanous Breathing and Patient connected to nasal cannula oxygen  Post-op Assessment: Report given to PACU RN and Post -op Vital signs reviewed and stable  Post vital signs: Reviewed and stable  Complications: No apparent anesthesia complications

## 2011-07-08 NOTE — Anesthesia Postprocedure Evaluation (Signed)
  Anesthesia Post-op Note  Patient: Christopher Ritter  Procedure(s) Performed:  THROMBECTOMY ARTERIOVENOUS FISTULA - Incision of Ulcerated Skin and Revision of Left Upper Arm Fistula  Patient Location: PACU  Anesthesia Type: MAC  Level of Consciousness: awake  Airway and Oxygen Therapy: Patient Spontanous Breathing  Post-op Pain: mild  Post-op Assessment: Post-op Vital signs reviewed  Post-op Vital Signs: stable  Complications: No apparent anesthesia complications

## 2011-07-08 NOTE — Anesthesia Postprocedure Evaluation (Signed)
  Anesthesia Post-op Note  Patient: Christopher Ritter  Procedure(s) Performed:  THROMBECTOMY ARTERIOVENOUS FISTULA - Incision of Ulcerated Skin and Revision of Left Upper Arm Fistula  Patient Location: PACU  Anesthesia Type: MAC  Level of Consciousness: awake, alert  and oriented  Airway and Oxygen Therapy: Patient Spontanous Breathing  Post-op Pain: none  Post-op Assessment: Post-op Vital signs reviewed, Patient's Cardiovascular Status Stable, Respiratory Function Stable, Patent Airway, No signs of Nausea or vomiting, Adequate PO intake and Pain level controlled  Post-op Vital Signs: Reviewed and stable  Complications: No apparent anesthesia complications

## 2011-07-08 NOTE — Preoperative (Signed)
Beta Blockers   Reason not to administer Beta Blockers:Not Applicable 

## 2011-07-08 NOTE — Progress Notes (Signed)
PATIENT REFUSES TO BE TREATED WITH MUPIRICIN  AT THIS TIME........WANTS TO WAIT UNTIL THE RESULTS COME BACK.........Marland Kitchen

## 2011-07-08 NOTE — Anesthesia Preprocedure Evaluation (Addendum)
Anesthesia Evaluation  Patient identified by MRN, date of birth, ID band Patient awake    Reviewed: Allergy & Precautions, H&P , NPO status , Patient's Chart, lab work & pertinent test results  Airway Mallampati: II TM Distance: >3 FB Neck ROM: Full    Dental  (+) Teeth Intact and Dental Advisory Given   Pulmonary          Cardiovascular hypertension, Pt. on medications + CAD + Valvular Problems/Murmurs MR and MVP     Neuro/Psych  Headaches,    GI/Hepatic GERD-  ,  Endo/Other    Renal/GU Dialysis and ESRFRenal disease     Musculoskeletal   Abdominal   Peds  Hematology   Anesthesia Other Findings   Reproductive/Obstetrics                        Anesthesia Physical Anesthesia Plan  ASA: III  Anesthesia Plan: MAC   Post-op Pain Management:    Induction: Intravenous  Airway Management Planned: Mask  Additional Equipment:   Intra-op Plan:   Post-operative Plan:   Informed Consent:   Plan Discussed with: CRNA  Anesthesia Plan Comments:         Anesthesia Quick Evaluation

## 2011-07-09 ENCOUNTER — Encounter (HOSPITAL_COMMUNITY): Payer: Self-pay | Admitting: Vascular Surgery

## 2011-07-09 MED FILL — Mupirocin Oint 2%: CUTANEOUS | Qty: 22 | Status: AC

## 2011-07-12 DIAGNOSIS — Z7682 Awaiting organ transplant status: Secondary | ICD-10-CM | POA: Diagnosis not present

## 2011-07-12 DIAGNOSIS — N186 End stage renal disease: Secondary | ICD-10-CM | POA: Diagnosis not present

## 2011-07-12 DIAGNOSIS — Z01818 Encounter for other preprocedural examination: Secondary | ICD-10-CM | POA: Diagnosis not present

## 2011-07-27 ENCOUNTER — Ambulatory Visit: Payer: BC Managed Care – PPO | Admitting: Vascular Surgery

## 2011-07-28 ENCOUNTER — Encounter: Payer: Self-pay | Admitting: Physician Assistant

## 2011-07-29 ENCOUNTER — Encounter: Payer: Self-pay | Admitting: Physician Assistant

## 2011-07-29 ENCOUNTER — Ambulatory Visit (INDEPENDENT_AMBULATORY_CARE_PROVIDER_SITE_OTHER): Payer: BC Managed Care – PPO | Admitting: Physician Assistant

## 2011-07-29 VITALS — BP 159/110 | HR 97 | Resp 18 | Ht 74.0 in | Wt 168.5 lb

## 2011-07-29 DIAGNOSIS — T82898A Other specified complication of vascular prosthetic devices, implants and grafts, initial encounter: Secondary | ICD-10-CM | POA: Insufficient documentation

## 2011-07-29 DIAGNOSIS — N186 End stage renal disease: Secondary | ICD-10-CM | POA: Insufficient documentation

## 2011-07-29 NOTE — Progress Notes (Signed)
[facility] [place] Postoperative Visit hemodialysis access   Date of Surgery:  07/08/11 Surgeon: Abby Potash HD:  yes HD:  Days:  M/W/F   CC: f/u for excision of ulcerated skin overlying functioning LUA AVF with revision of fistula   HPI:  This is a 41 y.o. male who returns today s/p  excision of ulcerated skin overlying functioning LUA AVF with revision of fistula on 07/08/11.  He states that he has been doing quite well since surgery.  They are able to use his fistula without difficulty.  He denies any sx of steal.  PHYSICAL EXAMINATION:  Filed Vitals:   07/29/11 1606  BP: 159/110  Pulse: 97  Resp: 18     Incision is c/d/i Hand grip is equal bilaterally and sensation in digits is intact; There is  Thrill; there is bruit. The graft/fistula is easily palpable  Pulse:  + palpable left radial pulse  ASSESSMENT/PLAN:  EMIT KUENZEL is a 41 y.o. year old male who presents s/p excision of ulcerated skin overlying functioning LUA AVF with revision of fistula on 07/08/11. -he is doing well and his AVF is functioning just fine with HD. -he will return to see [place] on a PRN basis.  Newton Pigg, PA-C [facility] [phone]  Clinic MD:   Darrick Penna

## 2011-09-02 DIAGNOSIS — N186 End stage renal disease: Secondary | ICD-10-CM | POA: Diagnosis not present

## 2011-09-08 DIAGNOSIS — Z01818 Encounter for other preprocedural examination: Secondary | ICD-10-CM | POA: Diagnosis not present

## 2011-09-08 DIAGNOSIS — Z7682 Awaiting organ transplant status: Secondary | ICD-10-CM | POA: Diagnosis not present

## 2011-09-08 DIAGNOSIS — N186 End stage renal disease: Secondary | ICD-10-CM | POA: Diagnosis not present

## 2011-10-06 DIAGNOSIS — N058 Unspecified nephritic syndrome with other morphologic changes: Secondary | ICD-10-CM | POA: Diagnosis not present

## 2012-03-29 ENCOUNTER — Encounter: Payer: Self-pay | Admitting: Vascular Surgery

## 2012-03-29 ENCOUNTER — Ambulatory Visit (INDEPENDENT_AMBULATORY_CARE_PROVIDER_SITE_OTHER): Payer: Medicare Other | Admitting: Vascular Surgery

## 2012-03-29 ENCOUNTER — Telehealth: Payer: Self-pay

## 2012-03-29 ENCOUNTER — Encounter (INDEPENDENT_AMBULATORY_CARE_PROVIDER_SITE_OTHER): Payer: Medicare Other | Admitting: *Deleted

## 2012-03-29 VITALS — BP 175/96 | HR 117 | Resp 18 | Ht 75.0 in | Wt 168.0 lb

## 2012-03-29 DIAGNOSIS — T82898A Other specified complication of vascular prosthetic devices, implants and grafts, initial encounter: Secondary | ICD-10-CM

## 2012-03-29 DIAGNOSIS — N186 End stage renal disease: Secondary | ICD-10-CM | POA: Diagnosis not present

## 2012-03-29 NOTE — Telephone Encounter (Signed)
Discussed symptoms w/ Dr. Edilia Bo.  Advised to bring in today with (L) UE doppler and office visit.

## 2012-03-29 NOTE — Progress Notes (Signed)
[facility] [place]  Patient name: Christopher Ritter MRN: [id number] DOB: 1970/10/26 Sex: male  REASON FOR VISIT: left shoulder pain and question swelling over left upper arm AV fistula.  HPI: Christopher Ritter is a 41 y.o. male who was seen as an add-on today because of a complaint of left shoulder pain and swelling over the central portion of his AV fistula. Patient states that he has had pain when he elevates his arm which is been going on for several weeks. He denies any arm swelling on the left. He does not remember any specific injury to the left arm. He is scheduled to see an orthopedic surgeon tomorrow. In addition he was concerned about an area with a small amount of swelling over the central fistula. He states that he is not noted any drainage or erythema in this area. He denies any history of fever or chills. The fistula has been working well.   REVIEW OF SYSTEMS: Arly.Keller ] denotes positive finding; [  ] denotes negative finding  CARDIOVASCULAR:  [ ]  chest pain   [ ]  dyspnea on exertion    CONSTITUTIONAL:  [ ]  fever   [ ]  chills  PHYSICAL EXAM: Filed Vitals:   03/29/12 1645  BP: 175/96  Pulse: 117  Resp: 18  Height: 6\' 3"  (1.905 m)  Weight: 168 lb (76.204 kg)   Body mass index is 21.00 kg/(m^2). GENERAL: The patient is a well-nourished male, in no acute distress. The vital signs are documented above. CARDIOVASCULAR: There is a regular rate and rhythm  PULMONARY: There is good air exchange bilaterally without wheezing or rales. The left upper arm AV fistula has an excellent thrill. It is somewhat tortuous and slightly pulsatile. There is some very mild swelling over the central portion of the fistula but I do not see any obvious purulence or cellulitis.  I have independently interpreted the duplex scan of the fistula which shows a patent left brachiocephalic fistula with no areas of stenosis noted. There is one small competing branches noted. Outflow was noted to be  tortuous distally. Diameters range from 0.79 to 2.0 cm. Deficit are reasonable at 0.23-0.41 cm.  MEDICAL ISSUES:  End stage renal disease I do not think that this patient's left shoulder pain is related to his AV fistula. He has had no arm swelling on the left. He is scheduled to see an orthopedic doctor tomorrow. Concerning an area of swelling over his fistula,, currently I do not see any cellulitis or purulence to suggest infection. However, I think it would be wise to not cannulate near this area for several weeks and to keep a close eye on this. If he develops erythema or increased swelling in this area I think this should be explored in the operating room. He will call if he develops increased swelling over the fistula, erythema, fever, or drainage.   Ormand Senn S [facility] [place] Beeper: [phone]

## 2012-03-29 NOTE — Telephone Encounter (Signed)
Wife called to report that pt. Having difficulty lifting (L) arm above waist level.  Describes a "grape-size swelling over (L) UA fistula site that has a white pus-type appearance in center of it".  Denies any fevers/ chills today, and states "he did have fever a couple of weeks ago."  Also relates recent hx. Of having difficulty moving/lifting the right arm, and was evaluated per his PCP, and referred to an Orthopedic MD; states has appt. 9/26 with Orthopedics.   Wife stated she doesn't want pt. To take dialysis tx. Until his left arm access is evaluated.  Requesting appt. Today.

## 2012-03-29 NOTE — Assessment & Plan Note (Signed)
I do not think that this patient's left shoulder pain is related to his AV fistula. He has had no arm swelling on the left. He is scheduled to see an orthopedic doctor tomorrow. Concerning an area of swelling over his fistula,, currently I do not see any cellulitis or purulence to suggest infection. However, I think it would be wise to not cannulate near this area for several weeks and to keep a close eye on this. If he develops erythema or increased swelling in this area I think this should be explored in the operating room. He will call if he develops increased swelling over the fistula, erythema, fever, or drainage.

## 2012-08-04 DIAGNOSIS — N186 End stage renal disease: Secondary | ICD-10-CM | POA: Diagnosis not present

## 2012-08-05 DIAGNOSIS — J209 Acute bronchitis, unspecified: Secondary | ICD-10-CM | POA: Diagnosis not present

## 2012-08-30 DIAGNOSIS — M87059 Idiopathic aseptic necrosis of unspecified femur: Secondary | ICD-10-CM | POA: Diagnosis not present

## 2012-08-30 DIAGNOSIS — Z79899 Other long term (current) drug therapy: Secondary | ICD-10-CM | POA: Diagnosis not present

## 2012-08-30 DIAGNOSIS — E079 Disorder of thyroid, unspecified: Secondary | ICD-10-CM | POA: Diagnosis not present

## 2012-08-30 DIAGNOSIS — M87029 Idiopathic aseptic necrosis of unspecified humerus: Secondary | ICD-10-CM | POA: Diagnosis not present

## 2012-09-05 ENCOUNTER — Other Ambulatory Visit: Payer: Self-pay | Admitting: Anesthesiology

## 2012-09-05 DIAGNOSIS — R609 Edema, unspecified: Secondary | ICD-10-CM

## 2012-09-07 ENCOUNTER — Other Ambulatory Visit: Payer: BC Managed Care – PPO

## 2012-09-11 ENCOUNTER — Ambulatory Visit
Admission: RE | Admit: 2012-09-11 | Discharge: 2012-09-11 | Disposition: A | Discharge status: Home / Self Care | Payer: BC Managed Care – PPO | Source: Ambulatory Visit | Attending: Anesthesiology | Admitting: Anesthesiology

## 2012-09-11 DIAGNOSIS — R609 Edema, unspecified: Secondary | ICD-10-CM

## 2012-10-17 DIAGNOSIS — M87059 Idiopathic aseptic necrosis of unspecified femur: Secondary | ICD-10-CM | POA: Diagnosis not present

## 2012-10-17 DIAGNOSIS — M87029 Idiopathic aseptic necrosis of unspecified humerus: Secondary | ICD-10-CM | POA: Diagnosis not present

## 2012-10-17 DIAGNOSIS — E079 Disorder of thyroid, unspecified: Secondary | ICD-10-CM | POA: Diagnosis not present

## 2012-11-22 ENCOUNTER — Encounter: Payer: Self-pay | Admitting: Vascular Surgery

## 2012-11-22 ENCOUNTER — Other Ambulatory Visit: Payer: Self-pay | Admitting: *Deleted

## 2012-11-22 DIAGNOSIS — T82598A Other mechanical complication of other cardiac and vascular devices and implants, initial encounter: Secondary | ICD-10-CM

## 2012-11-23 ENCOUNTER — Encounter: Payer: Self-pay | Admitting: Vascular Surgery

## 2012-11-23 ENCOUNTER — Ambulatory Visit (INDEPENDENT_AMBULATORY_CARE_PROVIDER_SITE_OTHER): Payer: Medicare Other | Admitting: Vascular Surgery

## 2012-11-23 ENCOUNTER — Other Ambulatory Visit: Payer: Self-pay

## 2012-11-23 ENCOUNTER — Encounter (INDEPENDENT_AMBULATORY_CARE_PROVIDER_SITE_OTHER): Payer: Medicare Other | Admitting: *Deleted

## 2012-11-23 VITALS — BP 148/97 | HR 87 | Ht 75.0 in | Wt 151.8 lb

## 2012-11-23 DIAGNOSIS — T82598A Other mechanical complication of other cardiac and vascular devices and implants, initial encounter: Secondary | ICD-10-CM

## 2012-11-23 DIAGNOSIS — N186 End stage renal disease: Secondary | ICD-10-CM | POA: Diagnosis not present

## 2012-11-23 NOTE — Progress Notes (Signed)
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 The patient previously had a repair similar to this by Dr. Lawson several months ago. He has had no bleeding episodes. The fistula has been in place since 2010. He currently dialyzes in [place] Monday Wednesday Friday.  Review of systems: He denies shortness of breath. He denies chest pain.  Physical exam:  Filed Vitals:   11/23/12 1039  BP: 148/97  Pulse: 87  Height: 6' 3" (1.905 m)  Weight: 151 lb 12.8 oz (68.856 kg)  SpO2: 100%   Left upper extremity. He has multiple areas in the proximal third to midportion of the fistula of small pseudoaneurysms. There is one proximally that is very shiny and thinned out. It is pulsatile. There is a well-healed scar at the distal third of the fistula. There is mild tortuosity. There is a palpable 2+ left radial pulse.  Data: The patient had a duplex exam of his fistula today. There is no significant narrowing. There were multiple areas of degeneration. I reviewed and interpreted this study.  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 Plan: Plication of the mid section of his fistula on 11/28/2012. He may require dialysis catheter at that time depending on how much fistula after repair. He will discuss with his dialysis center whether or not they feel they will be able to cannulate the fistula above and below the planned area of repair. Risks benefits possible complications and procedure details the fistula repair were explained to the patient today he understands and agrees to proceed.  Hjalmar Ballengee, MD [facility] Office: [phone] Pager: [phone]  

## 2012-11-24 ENCOUNTER — Encounter (HOSPITAL_COMMUNITY): Payer: Self-pay | Admitting: *Deleted

## 2012-11-24 ENCOUNTER — Encounter (HOSPITAL_COMMUNITY): Payer: Self-pay | Admitting: Pharmacy Technician

## 2012-11-27 MED ORDER — SODIUM CHLORIDE 0.9 % IV SOLN
INTRAVENOUS | Status: DC
  Administered 2012-11-28: 10:00:00 via INTRAVENOUS

## 2012-11-27 MED ORDER — CEFUROXIME SODIUM 1.5 G IJ SOLR
1.5000 g | INTRAMUSCULAR | Status: AC
  Administered 2012-11-28: 1.5 g via INTRAVENOUS
  Filled 2012-11-27: qty 1.5

## 2012-11-28 ENCOUNTER — Encounter (HOSPITAL_COMMUNITY)
Admission: RE | Disposition: A | Discharge status: Home / Self Care | Payer: Self-pay | Source: Ambulatory Visit | Attending: Vascular Surgery

## 2012-11-28 ENCOUNTER — Encounter (HOSPITAL_COMMUNITY): Payer: Self-pay | Admitting: Certified Registered"

## 2012-11-28 ENCOUNTER — Ambulatory Visit (HOSPITAL_COMMUNITY): Payer: Medicare Other | Admitting: Certified Registered"

## 2012-11-28 ENCOUNTER — Encounter (HOSPITAL_COMMUNITY): Payer: Self-pay | Admitting: *Deleted

## 2012-11-28 ENCOUNTER — Ambulatory Visit (HOSPITAL_COMMUNITY)
Admission: RE | Admit: 2012-11-28 | Discharge: 2012-11-28 | Disposition: A | Discharge status: Home / Self Care | Payer: Medicare Other | Source: Ambulatory Visit | Attending: Vascular Surgery | Admitting: Vascular Surgery

## 2012-11-28 ENCOUNTER — Ambulatory Visit (HOSPITAL_COMMUNITY): Payer: Medicare Other

## 2012-11-28 ENCOUNTER — Telehealth: Payer: Self-pay | Admitting: Vascular Surgery

## 2012-11-28 DIAGNOSIS — I120 Hypertensive chronic kidney disease with stage 5 chronic kidney disease or end stage renal disease: Secondary | ICD-10-CM | POA: Diagnosis not present

## 2012-11-28 DIAGNOSIS — N189 Chronic kidney disease, unspecified: Secondary | ICD-10-CM | POA: Diagnosis not present

## 2012-11-28 DIAGNOSIS — D649 Anemia, unspecified: Secondary | ICD-10-CM | POA: Insufficient documentation

## 2012-11-28 DIAGNOSIS — T82898A Other specified complication of vascular prosthetic devices, implants and grafts, initial encounter: Secondary | ICD-10-CM

## 2012-11-28 DIAGNOSIS — IMO0002 Reserved for concepts with insufficient information to code with codable children: Secondary | ICD-10-CM | POA: Insufficient documentation

## 2012-11-28 DIAGNOSIS — N186 End stage renal disease: Secondary | ICD-10-CM | POA: Diagnosis not present

## 2012-11-28 DIAGNOSIS — Z01818 Encounter for other preprocedural examination: Secondary | ICD-10-CM | POA: Diagnosis not present

## 2012-11-28 DIAGNOSIS — Z992 Dependence on renal dialysis: Secondary | ICD-10-CM | POA: Diagnosis not present

## 2012-11-28 DIAGNOSIS — I721 Aneurysm of artery of upper extremity: Secondary | ICD-10-CM | POA: Diagnosis not present

## 2012-11-28 DIAGNOSIS — R011 Cardiac murmur, unspecified: Secondary | ICD-10-CM | POA: Diagnosis not present

## 2012-11-28 DIAGNOSIS — I10 Essential (primary) hypertension: Secondary | ICD-10-CM | POA: Diagnosis not present

## 2012-11-28 DIAGNOSIS — Y832 Surgical operation with anastomosis, bypass or graft as the cause of abnormal reaction of the patient, or of later complication, without mention of misadventure at the time of the procedure: Secondary | ICD-10-CM | POA: Insufficient documentation

## 2012-11-28 DIAGNOSIS — K219 Gastro-esophageal reflux disease without esophagitis: Secondary | ICD-10-CM | POA: Insufficient documentation

## 2012-11-28 HISTORY — DX: Constipation, unspecified: K59.00

## 2012-11-28 HISTORY — PX: REVISON OF ARTERIOVENOUS FISTULA: SHX6074

## 2012-11-28 LAB — POCT I-STAT 4, (NA,K, GLUC, HGB,HCT)
Glucose, Bld: 95 mg/dL (ref 70–99)
Hemoglobin: 13.3 g/dL (ref 13.0–17.0)

## 2012-11-28 LAB — SURGICAL PCR SCREEN: MRSA, PCR: NEGATIVE

## 2012-11-28 SURGERY — REVISON OF ARTERIOVENOUS FISTULA
Anesthesia: Monitor Anesthesia Care | Site: Neck | Wound class: Clean

## 2012-11-28 MED ORDER — PROPOFOL INFUSION 10 MG/ML OPTIME
INTRAVENOUS | Status: DC | PRN
  Administered 2012-11-28: 100 ug/kg/min via INTRAVENOUS

## 2012-11-28 MED ORDER — LIDOCAINE HCL 1 % IJ SOLN
INTRAMUSCULAR | Status: DC | PRN
  Administered 2012-11-28: 30 mL

## 2012-11-28 MED ORDER — 0.9 % SODIUM CHLORIDE (POUR BTL) OPTIME
TOPICAL | Status: DC | PRN
  Administered 2012-11-28: 1000 mL

## 2012-11-28 MED ORDER — MIDAZOLAM HCL 5 MG/5ML IJ SOLN
INTRAMUSCULAR | Status: DC | PRN
  Administered 2012-11-28: 2 mg via INTRAVENOUS

## 2012-11-28 MED ORDER — ONDANSETRON HCL 4 MG/2ML IJ SOLN
INTRAMUSCULAR | Status: DC | PRN
  Administered 2012-11-28: 4 mg via INTRAVENOUS

## 2012-11-28 MED ORDER — SODIUM CHLORIDE 0.9 % IV SOLN
INTRAVENOUS | Status: DC | PRN
  Administered 2012-11-28 (×2): via INTRAVENOUS

## 2012-11-28 MED ORDER — CLONIDINE HCL 0.2 MG PO TABS: 0.2000 mg | ORAL_TABLET | ORAL | Status: DC | PRN

## 2012-11-28 MED ORDER — HYDROMORPHONE HCL PF 1 MG/ML IJ SOLN
0.2500 mg | INTRAMUSCULAR | Status: DC | PRN
  Administered 2012-11-28 (×2): 0.5 mg via INTRAVENOUS

## 2012-11-28 MED ORDER — THROMBIN 20000 UNITS EX SOLR
CUTANEOUS | Status: AC
  Filled 2012-11-28: qty 20000

## 2012-11-28 MED ORDER — HEPARIN SODIUM (PORCINE) 1000 UNIT/ML IJ SOLN
INTRAMUSCULAR | Status: DC | PRN
  Administered 2012-11-28: 5 mL via INTRAVENOUS

## 2012-11-28 MED ORDER — FENTANYL CITRATE 0.05 MG/ML IJ SOLN
INTRAMUSCULAR | Status: DC | PRN
  Administered 2012-11-28: 50 ug via INTRAVENOUS

## 2012-11-28 MED ORDER — OXYCODONE HCL 5 MG PO TABS
5.0000 mg | ORAL_TABLET | ORAL | Status: DC | PRN
  Administered 2012-11-28: 5 mg via ORAL

## 2012-11-28 MED ORDER — HYDROMORPHONE HCL PF 1 MG/ML IJ SOLN
INTRAMUSCULAR | Status: AC
  Filled 2012-11-28: qty 1

## 2012-11-28 MED ORDER — AMLODIPINE BESYLATE 10 MG PO TABS
10.0000 mg | ORAL_TABLET | Freq: Every day | ORAL | Status: DC
Start: 2012-11-29 — End: 2012-11-28

## 2012-11-28 MED ORDER — SODIUM CHLORIDE 0.9 % IR SOLN
Status: DC | PRN
  Administered 2012-11-28: 12:00:00

## 2012-11-28 MED ORDER — CLONAZEPAM 2 MG PO TABS: 2.0000 mg | ORAL_TABLET | Freq: Every day | ORAL | Status: DC

## 2012-11-28 MED ORDER — MUPIROCIN 2 % EX OINT
TOPICAL_OINTMENT | CUTANEOUS | Status: AC
  Administered 2012-11-28: 1 via NASAL
  Filled 2012-11-28: qty 22

## 2012-11-28 MED ORDER — OXYCODONE HCL 5 MG PO TABS
ORAL_TABLET | ORAL | Status: AC
  Filled 2012-11-28: qty 1

## 2012-11-28 MED ORDER — PROTAMINE SULFATE 10 MG/ML IV SOLN
INTRAVENOUS | Status: DC | PRN
  Administered 2012-11-28: 50 mg via INTRAVENOUS

## 2012-11-28 MED ORDER — AMLODIPINE BESYLATE 10 MG PO TABS
10.0000 mg | ORAL_TABLET | ORAL | Status: AC
  Administered 2012-11-28: 10 mg via ORAL
  Filled 2012-11-28: qty 1

## 2012-11-28 MED ORDER — OXYCODONE-ACETAMINOPHEN 5-325 MG PO TABS: 1.0000 | ORAL_TABLET | ORAL | Status: DC | PRN

## 2012-11-28 MED ORDER — LIDOCAINE HCL (PF) 1 % IJ SOLN
INTRAMUSCULAR | Status: AC
  Filled 2012-11-28: qty 30

## 2012-11-28 MED ORDER — MUPIROCIN 2 % EX OINT
TOPICAL_OINTMENT | Freq: Two times a day (BID) | CUTANEOUS | Status: DC
  Administered 2012-11-28: 1 via NASAL
  Filled 2012-11-28: qty 22

## 2012-11-28 SURGICAL SUPPLY — 63 items
BAG DECANTER FOR FLEXI CONT (MISCELLANEOUS) ×3 IMPLANT
BANDAGE GAUZE ELAST BULKY 4 IN (GAUZE/BANDAGES/DRESSINGS) ×3 IMPLANT
CANISTER SUCTION 2500CC (MISCELLANEOUS) ×3 IMPLANT
CATH CANNON HEMO 15F 50CM (CATHETERS) IMPLANT
CATH CANNON HEMO 15FR 19 (HEMODIALYSIS SUPPLIES) IMPLANT
CATH CANNON HEMO 15FR 23CM (HEMODIALYSIS SUPPLIES) IMPLANT
CATH CANNON HEMO 15FR 31CM (HEMODIALYSIS SUPPLIES) IMPLANT
CATH CANNON HEMO 15FR 32CM (HEMODIALYSIS SUPPLIES) IMPLANT
CATH STRAIGHT 5FR 65CM (CATHETERS) IMPLANT
CHLORAPREP W/TINT 26ML (MISCELLANEOUS) IMPLANT
CLIP TI MEDIUM 6 (CLIP) ×3 IMPLANT
CLIP TI WIDE RED SMALL 6 (CLIP) ×3 IMPLANT
CLOTH BEACON ORANGE TIMEOUT ST (SAFETY) ×3 IMPLANT
COVER PROBE W GEL 5X96 (DRAPES) ×3 IMPLANT
COVER SURGICAL LIGHT HANDLE (MISCELLANEOUS) ×3 IMPLANT
DECANTER SPIKE VIAL GLASS SM (MISCELLANEOUS) IMPLANT
DERMABOND ADVANCED (GAUZE/BANDAGES/DRESSINGS) ×1
DERMABOND ADVANCED .7 DNX12 (GAUZE/BANDAGES/DRESSINGS) ×2 IMPLANT
DRAIN PENROSE 1/4X12 LTX STRL (WOUND CARE) ×3 IMPLANT
DRAPE C-ARM 42X72 X-RAY (DRAPES) IMPLANT
DRAPE CHEST BREAST 15X10 FENES (DRAPES) IMPLANT
ELECT REM PT RETURN 9FT ADLT (ELECTROSURGICAL) ×3
ELECTRODE REM PT RTRN 9FT ADLT (ELECTROSURGICAL) ×2 IMPLANT
GAUZE SPONGE 4X4 16PLY XRAY LF (GAUZE/BANDAGES/DRESSINGS) IMPLANT
GEL ULTRASOUND 20GR AQUASONIC (MISCELLANEOUS) IMPLANT
GLOVE BIO SURGEON STRL SZ 6.5 (GLOVE) ×6 IMPLANT
GLOVE BIO SURGEON STRL SZ7.5 (GLOVE) ×3 IMPLANT
GLOVE BIOGEL PI IND STRL 6.5 (GLOVE) ×2 IMPLANT
GLOVE BIOGEL PI IND STRL 7.0 (GLOVE) ×6 IMPLANT
GLOVE BIOGEL PI INDICATOR 6.5 (GLOVE) ×1
GLOVE BIOGEL PI INDICATOR 7.0 (GLOVE) ×3
GLOVE ECLIPSE 6.5 STRL STRAW (GLOVE) ×3 IMPLANT
GLOVE ECLIPSE 7.0 STRL STRAW (GLOVE) ×3 IMPLANT
GOWN PREVENTION PLUS XLARGE (GOWN DISPOSABLE) ×3 IMPLANT
GOWN STRL NON-REIN LRG LVL3 (GOWN DISPOSABLE) ×12 IMPLANT
KIT BASIN OR (CUSTOM PROCEDURE TRAY) ×3 IMPLANT
KIT ROOM TURNOVER OR (KITS) ×3 IMPLANT
LOOP VESSEL MINI RED (MISCELLANEOUS) IMPLANT
NEEDLE 18GX1X1/2 (RX/OR ONLY) (NEEDLE) IMPLANT
NEEDLE HYPO 25GX1X1/2 BEV (NEEDLE) ×3 IMPLANT
NS IRRIG 1000ML POUR BTL (IV SOLUTION) ×3 IMPLANT
PACK CV ACCESS (CUSTOM PROCEDURE TRAY) ×3 IMPLANT
PACK SURGICAL SETUP 50X90 (CUSTOM PROCEDURE TRAY) IMPLANT
PAD ARMBOARD 7.5X6 YLW CONV (MISCELLANEOUS) ×6 IMPLANT
SET MICROPUNCTURE 5F STIFF (MISCELLANEOUS) IMPLANT
SPONGE SURGIFOAM ABS GEL 100 (HEMOSTASIS) IMPLANT
SUT ETHILON 3 0 PS 1 (SUTURE) ×3 IMPLANT
SUT PROLENE 5 0 C 1 24 (SUTURE) ×12 IMPLANT
SUT PROLENE 7 0 BV 1 (SUTURE) ×3 IMPLANT
SUT VIC AB 3-0 SH 27 (SUTURE) ×1
SUT VIC AB 3-0 SH 27X BRD (SUTURE) ×2 IMPLANT
SUT VICRYL 4-0 PS2 18IN ABS (SUTURE) ×3 IMPLANT
SYR 20CC LL (SYRINGE) IMPLANT
SYR 30ML LL (SYRINGE) IMPLANT
SYR 5ML LL (SYRINGE) IMPLANT
SYR CONTROL 10ML LL (SYRINGE) ×3 IMPLANT
SYRINGE 10CC LL (SYRINGE) IMPLANT
TAPE CLOTH SURG 4X10 WHT LF (GAUZE/BANDAGES/DRESSINGS) ×3 IMPLANT
TOWEL OR 17X24 6PK STRL BLUE (TOWEL DISPOSABLE) ×3 IMPLANT
TOWEL OR 17X26 10 PK STRL BLUE (TOWEL DISPOSABLE) ×3 IMPLANT
UNDERPAD 30X30 INCONTINENT (UNDERPADS AND DIAPERS) ×3 IMPLANT
WATER STERILE IRR 1000ML POUR (IV SOLUTION) ×3 IMPLANT
WIRE AMPLATZ SS-J .035X180CM (WIRE) IMPLANT

## 2012-11-28 NOTE — Progress Notes (Signed)
Dressing starting showing blood again thorough dressing. Dr. Darrick Penna notified will see patient prior to discharge.

## 2012-11-28 NOTE — Interval H&P Note (Signed)
History and Physical Interval Note:  11/28/2012 11:33 AM  Christopher Ritter  has presented today for surgery, with the diagnosis of End Stage Renal Disease  The various methods of treatment have been discussed with the patient and family. After consideration of risks, benefits and other options for treatment, the patient has consented to  Procedure(s) with comments: REVISON OF ARTERIOVENOUS FISTULA (Left) - Plication of left arm AVF INSERTION OF DIALYSIS CATHETER (N/A) as a surgical intervention .  The patient's history has been reviewed, patient examined, no change in status, stable for surgery.  I have reviewed the patient's chart and labs.  Questions were answered to the patient's satisfaction.     [name]

## 2012-11-28 NOTE — Transfer of Care (Signed)
Immediate Anesthesia Transfer of Care Note  Patient: Christopher Ritter  Procedure(s) Performed: Procedure(s) with comments: REVISON OF ARTERIOVENOUS FISTULA (Left) - Plication   Patient Location: PACU  Anesthesia Type:MAC  Level of Consciousness: awake, alert  and oriented  Airway & Oxygen Therapy: Patient Spontanous Breathing and Patient connected to nasal cannula oxygen  Post-op Assessment: Report given to PACU RN, Post -op Vital signs reviewed and stable and Patient moving all extremities X 4  Post vital signs: Reviewed and stable  Complications: No apparent anesthesia complications

## 2012-11-28 NOTE — Op Note (Signed)
Procedure: Plication of left upper extremity AV fistula  Preoperative diagnosis: Aneurysmal degeneration left arm AV fistula   Postoperative diagnosis: Same  Anesthesia: Local with sedation  Assistant: Doreatha Massed PA-C  Operative findings: #1 plication of proximal and middle third of left upper extremity AV fistula        Operative details: After obtaining informed consent, the patient was taken to the operating room. The patient was placed in supine position on the operating room table.    Next attention was turned to the left upper extremity. The entire left upper extremity was prepped and draped in usual sterile fashion. Local anesthesia was infiltrated over the proximal half of the fistula.  A longitudinal incision was made over the central third of the fistula incorporating several areas of aneurysmal degeneration. The fistula was dissected free circumferentially throughout its course. The more proximal aneurysm was multilobulated and very thin walled. The more distal aneurysm was smaller. The patient was given 5000 units of intravenous heparin. The fistula was clamped proximally and distally above and below the aneurysms. The aneurysm was opened longitudinally and the redundant segment fistula excised. The area of resection was then reapproximated using a running 5-0 Prolene suture. The clamps were removed and flow restored to the fistula. Hemostasis was obtained with administration of 50 mg of protamine and 3 repair sutures.The subcutaneous tissues were reapproximated using a running 3-0 Vicryl suture. The skin was closed with a 4 0 Vicryl subcuticular stitch. Dermabond was applied to the skin incision.  The patient tolerated procedure well and there were no complications. Instrument sponge and needle counts were correct at the end of the case. The patient was taken to the recovery room in stable condition.   Fabienne Bruns, MD [facility] Office:  [phone] Pager: [phone]

## 2012-11-28 NOTE — H&P (View-Only) (Signed)
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 The patient previously had a repair similar to this by Dr. Hart Rochester several months ago. He has had no bleeding episodes. The fistula has been in place since 2010. He currently dialyzes in [facility] Monday Wednesday Friday.  Review of systems: He denies shortness of breath. He denies chest pain.  Physical exam:  Filed Vitals:   11/23/12 1039  BP: 148/97  Pulse: 87  Height: 6\' 3"  (1.905 m)  Weight: 151 lb 12.8 oz (68.856 kg)  SpO2: 100%   Left upper extremity. He has multiple areas in the proximal third to midportion of the fistula of small pseudoaneurysms. There is one proximally that is very shiny and thinned out. It is pulsatile. There is a well-healed scar at the distal third of the fistula. There is mild tortuosity. There is a palpable 2+ left radial pulse.  Data: The patient had a duplex exam of his fistula today. There is no significant narrowing. There were multiple areas of degeneration. I reviewed and interpreted this study.  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 Plan: Plication of the mid section of his fistula on 11/28/2012. He may require dialysis catheter at that time depending on how much fistula after repair. He will discuss with his dialysis center whether or not they feel they will be able to cannulate the fistula above and below the planned area of repair. Risks benefits possible complications and procedure details the fistula repair were explained to the patient today he understands and agrees to proceed.  Fabienne Bruns, MD [facility] Office: [phone] Pager: [phone]

## 2012-11-28 NOTE — Progress Notes (Signed)
Will hold patient for an additional 30-45 minutes per Dr. Darrick Penna

## 2012-11-28 NOTE — Anesthesia Postprocedure Evaluation (Signed)
  Anesthesia Post-op Note  Patient: Christopher Ritter  Procedure(s) Performed: Procedure(s) with comments: REVISON OF ARTERIOVENOUS FISTULA (Left) - Plication   Patient Location: PACU  Anesthesia Type:MAC  Level of Consciousness: awake, alert  and oriented  Airway and Oxygen Therapy: Patient Spontanous Breathing and Patient connected to nasal cannula oxygen  Post-op Pain: mild  Post-op Assessment: Post-op Vital signs reviewed, Patient's Cardiovascular Status Stable, Respiratory Function Stable, Patent Airway and No signs of Nausea or vomiting  Post-op Vital Signs: Reviewed and stable  Complications: No apparent anesthesia complications

## 2012-11-28 NOTE — Progress Notes (Signed)
Thrill and bruit present. Patient had blood on dressing. Redressed wound not active bleeding noted.

## 2012-11-28 NOTE — Progress Notes (Signed)
Noted pt BP to be elevated.  Pt states he took norvasc before bed, Dr. Darrick Penna called, Ordered for norvasc 10 mg to be given now, and patient is to hold PM dose tonight.

## 2012-11-28 NOTE — Preoperative (Signed)
Beta Blockers   Reason not to administer Beta Blockers:Not Applicable 

## 2012-11-28 NOTE — Telephone Encounter (Addendum)
Message copied by Rosalyn Charters on Tue Nov 28, 2012  4:09 PM ------      Message from: Melene Plan      Created: Tue Nov 28, 2012  2:32 PM                   ----- Message -----         From: Lars Mage, PA-C         Sent: 11/28/2012   1:01 PM           To: Melene Plan, RN            F/U in 2 weeks with Dr. Darrick Penna plication left arm av fistula.  notified patient of fu appt. with dr. Darrick Penna on 12-14-12 3:15 ------

## 2012-11-28 NOTE — Progress Notes (Signed)
Pt with poorly controlled BP as high as 200's systolic post op.  His BP is improved after receiving Amlodipine now 140s over 90s.  Was noted by nurse to have saturated his dressing.    On exam no obvious hematoma, no oozing currently, palpable thrill in fistula  A: Post op bleeding has spontaneously stopped possibly due to severe hypertension and improved with BP control.  P: Continue to observe another 45 min.  If no further bleeding will d/c home.  If continued oozing will consider 23hr obs to watch incision and control BP.  Fabienne Bruns, MD [facility] Office: [phone] Pager: [phone]

## 2012-11-28 NOTE — Anesthesia Preprocedure Evaluation (Signed)
Anesthesia Evaluation  Patient identified by MRN, date of birth, ID band Patient awake    Reviewed: Allergy & Precautions, H&P , NPO status , Patient's Chart, lab work & pertinent test results  Airway Mallampati: II TM Distance: >3 FB Neck ROM: Full    Dental no notable dental hx. (+) Teeth Intact and Dental Advisory Given   Pulmonary neg pulmonary ROS,  breath sounds clear to auscultation  Pulmonary exam normal       Cardiovascular hypertension, + Valvular Problems/Murmurs Rhythm:Regular Rate:Normal + Diastolic murmurs    Neuro/Psych  Headaches, negative psych ROS   GI/Hepatic Neg liver ROS, GERD-  Medicated and Controlled,  Endo/Other  negative endocrine ROS  Renal/GU Dialysis and ESRFRenal disease  negative genitourinary   Musculoskeletal   Abdominal   Peds  Hematology negative hematology ROS (+) anemia ,   Anesthesia Other Findings   Reproductive/Obstetrics negative OB ROS                           Anesthesia Physical Anesthesia Plan  ASA: III  Anesthesia Plan: MAC   Post-op Pain Management:    Induction: Intravenous  Airway Management Planned: Simple Face Mask  Additional Equipment:   Intra-op Plan:   Post-operative Plan:   Informed Consent: I have reviewed the patients History and Physical, chart, labs and discussed the procedure including the risks, benefits and alternatives for the proposed anesthesia with the patient or authorized representative who has indicated his/her understanding and acceptance.   Dental advisory given  Plan Discussed with: CRNA  Anesthesia Plan Comments:         Anesthesia Quick Evaluation

## 2012-11-28 NOTE — Progress Notes (Signed)
No further bleed through noted dressing. Patient discharged

## 2012-11-29 ENCOUNTER — Telehealth: Payer: Self-pay

## 2012-11-29 ENCOUNTER — Ambulatory Visit: Payer: Medicare Other | Admitting: Vascular Surgery

## 2012-11-29 ENCOUNTER — Encounter (HOSPITAL_COMMUNITY): Payer: Self-pay | Admitting: Vascular Surgery

## 2012-11-29 NOTE — Addendum Note (Signed)
Addendum created 11/29/12 1215 by Adair Laundry, CRNA   Modules edited: Anesthesia Medication Administration

## 2012-11-29 NOTE — Telephone Encounter (Signed)
Wife called to report that pt's. Left arm is "more visibly swollen"  States "the (L)  hand and arm are approximately one fourth larger in size than the right arm, and firm."  Describes a "golf-ball-sized swelling at the end of the incision".  Reports that there was problems with bleeding from incision, following the surgery yesterday, and when they got home, the pt. had bled through the dressing.  Spoke to Dr. Darrick Penna last night regarding the bleeding.  This AM states "there is a small amt. of oozing from the incision."  Wife voices concern that with previous procedures, he has never had this much problem.  Discussed with Dr. Edilia Bo, and advised to bring pt. in this AM for evaluation.  Left message regarding having pt. come to office now for evaluation.

## 2012-12-04 ENCOUNTER — Other Ambulatory Visit: Payer: Self-pay | Admitting: *Deleted

## 2012-12-04 DIAGNOSIS — G8918 Other acute postprocedural pain: Secondary | ICD-10-CM

## 2012-12-04 MED ORDER — HYDROCODONE-ACETAMINOPHEN 5-325 MG PO TABS: 1.0000 | ORAL_TABLET | Freq: Four times a day (QID) | ORAL | Status: DC | PRN

## 2012-12-08 DIAGNOSIS — R509 Fever, unspecified: Secondary | ICD-10-CM | POA: Diagnosis not present

## 2012-12-08 DIAGNOSIS — J209 Acute bronchitis, unspecified: Secondary | ICD-10-CM | POA: Diagnosis not present

## 2012-12-13 ENCOUNTER — Encounter: Payer: Self-pay | Admitting: Vascular Surgery

## 2012-12-14 ENCOUNTER — Ambulatory Visit: Payer: Medicare Other | Admitting: Vascular Surgery

## 2012-12-29 ENCOUNTER — Telehealth: Payer: Self-pay | Admitting: Vascular Surgery

## 2012-12-29 NOTE — Telephone Encounter (Addendum)
Message copied by Fredrich Birks on Fri Dec 29, 2012 11:57 AM ------      Message from: Phillips Odor      Created: Fri Dec 29, 2012 11:52 AM      Regarding: RE: Sutures       Spoke with pt.  Informed him he doesn't have sutures based on the OR report.  He denies any problems.  I offered him appt. 01/25/13 @ 1:15 PM.  He requested a letter be sent to him about appt. Time/date.  He knows to call office if problems prior to appt. 7/24. Thx.            ----- Message -----         From: Fredrich Birks         Sent: 12/28/2012   9:36 AM           To: Erenest Blank, RN      Subject: Modena Nunnery,            Mr Mcisaac did not make his appointment on 12/14/12 for a 2 week follow up. When I called to get him rescheduled, he said he had sutures that needed to be removed. Dr Darrick Penna is on vacation for two weeks so it would be the end of July before I can get him back in.            Should he come in for suture removal with another MD before [facility] can see him?            Thanks,      Annabelle Harman       ------  Made the appt and mailed out letter, dpm

## 2013-01-16 DIAGNOSIS — M87029 Idiopathic aseptic necrosis of unspecified humerus: Secondary | ICD-10-CM | POA: Diagnosis not present

## 2013-01-16 DIAGNOSIS — M87059 Idiopathic aseptic necrosis of unspecified femur: Secondary | ICD-10-CM | POA: Diagnosis not present

## 2013-01-24 ENCOUNTER — Encounter: Payer: Self-pay | Admitting: Vascular Surgery

## 2013-01-25 ENCOUNTER — Ambulatory Visit: Payer: Medicare Other | Admitting: Vascular Surgery

## 2013-01-31 ENCOUNTER — Encounter: Payer: Self-pay | Admitting: Vascular Surgery

## 2013-02-01 ENCOUNTER — Ambulatory Visit: Payer: Medicare Other | Admitting: Vascular Surgery

## 2013-03-01 DIAGNOSIS — R05 Cough: Secondary | ICD-10-CM | POA: Diagnosis not present

## 2013-03-01 DIAGNOSIS — R059 Cough, unspecified: Secondary | ICD-10-CM | POA: Diagnosis not present

## 2013-03-01 DIAGNOSIS — J3089 Other allergic rhinitis: Secondary | ICD-10-CM | POA: Diagnosis not present

## 2013-03-01 DIAGNOSIS — Z006 Encounter for examination for normal comparison and control in clinical research program: Secondary | ICD-10-CM | POA: Diagnosis not present

## 2013-03-01 DIAGNOSIS — N181 Chronic kidney disease, stage 1: Secondary | ICD-10-CM | POA: Diagnosis not present

## 2013-03-06 DIAGNOSIS — D631 Anemia in chronic kidney disease: Secondary | ICD-10-CM | POA: Diagnosis not present

## 2013-03-06 DIAGNOSIS — N2581 Secondary hyperparathyroidism of renal origin: Secondary | ICD-10-CM | POA: Diagnosis not present

## 2013-03-06 DIAGNOSIS — N186 End stage renal disease: Secondary | ICD-10-CM | POA: Diagnosis not present

## 2013-03-07 DIAGNOSIS — N2581 Secondary hyperparathyroidism of renal origin: Secondary | ICD-10-CM | POA: Diagnosis not present

## 2013-03-07 DIAGNOSIS — D631 Anemia in chronic kidney disease: Secondary | ICD-10-CM | POA: Diagnosis not present

## 2013-03-07 DIAGNOSIS — N186 End stage renal disease: Secondary | ICD-10-CM | POA: Diagnosis not present

## 2013-03-09 DIAGNOSIS — N2581 Secondary hyperparathyroidism of renal origin: Secondary | ICD-10-CM | POA: Diagnosis not present

## 2013-03-09 DIAGNOSIS — D631 Anemia in chronic kidney disease: Secondary | ICD-10-CM | POA: Diagnosis not present

## 2013-03-09 DIAGNOSIS — N186 End stage renal disease: Secondary | ICD-10-CM | POA: Diagnosis not present

## 2013-03-12 DIAGNOSIS — D631 Anemia in chronic kidney disease: Secondary | ICD-10-CM | POA: Diagnosis not present

## 2013-03-12 DIAGNOSIS — N2581 Secondary hyperparathyroidism of renal origin: Secondary | ICD-10-CM | POA: Diagnosis not present

## 2013-03-12 DIAGNOSIS — N186 End stage renal disease: Secondary | ICD-10-CM | POA: Diagnosis not present

## 2013-03-14 DIAGNOSIS — D631 Anemia in chronic kidney disease: Secondary | ICD-10-CM | POA: Diagnosis not present

## 2013-03-14 DIAGNOSIS — N2581 Secondary hyperparathyroidism of renal origin: Secondary | ICD-10-CM | POA: Diagnosis not present

## 2013-03-14 DIAGNOSIS — N186 End stage renal disease: Secondary | ICD-10-CM | POA: Diagnosis not present

## 2013-03-16 DIAGNOSIS — N186 End stage renal disease: Secondary | ICD-10-CM | POA: Diagnosis not present

## 2013-03-16 DIAGNOSIS — D631 Anemia in chronic kidney disease: Secondary | ICD-10-CM | POA: Diagnosis not present

## 2013-03-16 DIAGNOSIS — N2581 Secondary hyperparathyroidism of renal origin: Secondary | ICD-10-CM | POA: Diagnosis not present

## 2013-03-19 DIAGNOSIS — N186 End stage renal disease: Secondary | ICD-10-CM | POA: Diagnosis not present

## 2013-03-19 DIAGNOSIS — D631 Anemia in chronic kidney disease: Secondary | ICD-10-CM | POA: Diagnosis not present

## 2013-03-19 DIAGNOSIS — N2581 Secondary hyperparathyroidism of renal origin: Secondary | ICD-10-CM | POA: Diagnosis not present

## 2013-03-21 DIAGNOSIS — N186 End stage renal disease: Secondary | ICD-10-CM | POA: Diagnosis not present

## 2013-03-21 DIAGNOSIS — D631 Anemia in chronic kidney disease: Secondary | ICD-10-CM | POA: Diagnosis not present

## 2013-03-21 DIAGNOSIS — N2581 Secondary hyperparathyroidism of renal origin: Secondary | ICD-10-CM | POA: Diagnosis not present

## 2013-03-23 DIAGNOSIS — D631 Anemia in chronic kidney disease: Secondary | ICD-10-CM | POA: Diagnosis not present

## 2013-03-23 DIAGNOSIS — N2581 Secondary hyperparathyroidism of renal origin: Secondary | ICD-10-CM | POA: Diagnosis not present

## 2013-03-23 DIAGNOSIS — N186 End stage renal disease: Secondary | ICD-10-CM | POA: Diagnosis not present

## 2013-03-26 DIAGNOSIS — N2581 Secondary hyperparathyroidism of renal origin: Secondary | ICD-10-CM | POA: Diagnosis not present

## 2013-03-26 DIAGNOSIS — N186 End stage renal disease: Secondary | ICD-10-CM | POA: Diagnosis not present

## 2013-03-26 DIAGNOSIS — D631 Anemia in chronic kidney disease: Secondary | ICD-10-CM | POA: Diagnosis not present

## 2013-03-28 DIAGNOSIS — D631 Anemia in chronic kidney disease: Secondary | ICD-10-CM | POA: Diagnosis not present

## 2013-03-28 DIAGNOSIS — N186 End stage renal disease: Secondary | ICD-10-CM | POA: Diagnosis not present

## 2013-03-28 DIAGNOSIS — N2581 Secondary hyperparathyroidism of renal origin: Secondary | ICD-10-CM | POA: Diagnosis not present

## 2013-03-30 DIAGNOSIS — N2581 Secondary hyperparathyroidism of renal origin: Secondary | ICD-10-CM | POA: Diagnosis not present

## 2013-03-30 DIAGNOSIS — D631 Anemia in chronic kidney disease: Secondary | ICD-10-CM | POA: Diagnosis not present

## 2013-03-30 DIAGNOSIS — N186 End stage renal disease: Secondary | ICD-10-CM | POA: Diagnosis not present

## 2013-04-02 DIAGNOSIS — N2581 Secondary hyperparathyroidism of renal origin: Secondary | ICD-10-CM | POA: Diagnosis not present

## 2013-04-02 DIAGNOSIS — D631 Anemia in chronic kidney disease: Secondary | ICD-10-CM | POA: Diagnosis not present

## 2013-04-02 DIAGNOSIS — N186 End stage renal disease: Secondary | ICD-10-CM | POA: Diagnosis not present

## 2013-04-03 DIAGNOSIS — N186 End stage renal disease: Secondary | ICD-10-CM | POA: Diagnosis not present

## 2013-04-04 DIAGNOSIS — D509 Iron deficiency anemia, unspecified: Secondary | ICD-10-CM | POA: Diagnosis not present

## 2013-04-04 DIAGNOSIS — N186 End stage renal disease: Secondary | ICD-10-CM | POA: Diagnosis not present

## 2013-04-04 DIAGNOSIS — D631 Anemia in chronic kidney disease: Secondary | ICD-10-CM | POA: Diagnosis not present

## 2013-04-04 DIAGNOSIS — N2581 Secondary hyperparathyroidism of renal origin: Secondary | ICD-10-CM | POA: Diagnosis not present

## 2013-04-04 DIAGNOSIS — Z23 Encounter for immunization: Secondary | ICD-10-CM | POA: Diagnosis not present

## 2013-04-17 DIAGNOSIS — M87029 Idiopathic aseptic necrosis of unspecified humerus: Secondary | ICD-10-CM | POA: Diagnosis not present

## 2013-04-17 DIAGNOSIS — M87059 Idiopathic aseptic necrosis of unspecified femur: Secondary | ICD-10-CM | POA: Diagnosis not present

## 2013-04-17 DIAGNOSIS — G894 Chronic pain syndrome: Secondary | ICD-10-CM | POA: Diagnosis not present

## 2013-05-01 DIAGNOSIS — J209 Acute bronchitis, unspecified: Secondary | ICD-10-CM | POA: Diagnosis not present

## 2013-05-04 DIAGNOSIS — N186 End stage renal disease: Secondary | ICD-10-CM | POA: Diagnosis not present

## 2013-05-07 DIAGNOSIS — D631 Anemia in chronic kidney disease: Secondary | ICD-10-CM | POA: Diagnosis not present

## 2013-05-07 DIAGNOSIS — N186 End stage renal disease: Secondary | ICD-10-CM | POA: Diagnosis not present

## 2013-05-07 DIAGNOSIS — D509 Iron deficiency anemia, unspecified: Secondary | ICD-10-CM | POA: Diagnosis not present

## 2013-05-07 DIAGNOSIS — N2581 Secondary hyperparathyroidism of renal origin: Secondary | ICD-10-CM | POA: Diagnosis not present

## 2013-05-09 DIAGNOSIS — N2581 Secondary hyperparathyroidism of renal origin: Secondary | ICD-10-CM | POA: Diagnosis not present

## 2013-05-09 DIAGNOSIS — D631 Anemia in chronic kidney disease: Secondary | ICD-10-CM | POA: Diagnosis not present

## 2013-05-09 DIAGNOSIS — N186 End stage renal disease: Secondary | ICD-10-CM | POA: Diagnosis not present

## 2013-05-09 DIAGNOSIS — D509 Iron deficiency anemia, unspecified: Secondary | ICD-10-CM | POA: Diagnosis not present

## 2013-05-10 DIAGNOSIS — R6251 Failure to thrive (child): Secondary | ICD-10-CM | POA: Diagnosis not present

## 2013-05-10 DIAGNOSIS — K219 Gastro-esophageal reflux disease without esophagitis: Secondary | ICD-10-CM | POA: Diagnosis not present

## 2013-05-10 DIAGNOSIS — R630 Anorexia: Secondary | ICD-10-CM | POA: Diagnosis not present

## 2013-05-11 DIAGNOSIS — N2581 Secondary hyperparathyroidism of renal origin: Secondary | ICD-10-CM | POA: Diagnosis not present

## 2013-05-11 DIAGNOSIS — D509 Iron deficiency anemia, unspecified: Secondary | ICD-10-CM | POA: Diagnosis not present

## 2013-05-11 DIAGNOSIS — D631 Anemia in chronic kidney disease: Secondary | ICD-10-CM | POA: Diagnosis not present

## 2013-05-11 DIAGNOSIS — N186 End stage renal disease: Secondary | ICD-10-CM | POA: Diagnosis not present

## 2013-05-14 DIAGNOSIS — N186 End stage renal disease: Secondary | ICD-10-CM | POA: Diagnosis not present

## 2013-05-14 DIAGNOSIS — D509 Iron deficiency anemia, unspecified: Secondary | ICD-10-CM | POA: Diagnosis not present

## 2013-05-14 DIAGNOSIS — D631 Anemia in chronic kidney disease: Secondary | ICD-10-CM | POA: Diagnosis not present

## 2013-05-14 DIAGNOSIS — N2581 Secondary hyperparathyroidism of renal origin: Secondary | ICD-10-CM | POA: Diagnosis not present

## 2013-05-17 DIAGNOSIS — D631 Anemia in chronic kidney disease: Secondary | ICD-10-CM | POA: Diagnosis not present

## 2013-05-17 DIAGNOSIS — N186 End stage renal disease: Secondary | ICD-10-CM | POA: Diagnosis not present

## 2013-05-17 DIAGNOSIS — N2581 Secondary hyperparathyroidism of renal origin: Secondary | ICD-10-CM | POA: Diagnosis not present

## 2013-05-17 DIAGNOSIS — D509 Iron deficiency anemia, unspecified: Secondary | ICD-10-CM | POA: Diagnosis not present

## 2013-05-18 DIAGNOSIS — R630 Anorexia: Secondary | ICD-10-CM | POA: Diagnosis not present

## 2013-05-18 DIAGNOSIS — K219 Gastro-esophageal reflux disease without esophagitis: Secondary | ICD-10-CM | POA: Diagnosis not present

## 2013-05-18 DIAGNOSIS — K209 Esophagitis, unspecified without bleeding: Secondary | ICD-10-CM | POA: Diagnosis not present

## 2013-05-18 DIAGNOSIS — R110 Nausea: Secondary | ICD-10-CM | POA: Diagnosis not present

## 2013-05-18 DIAGNOSIS — K449 Diaphragmatic hernia without obstruction or gangrene: Secondary | ICD-10-CM | POA: Diagnosis not present

## 2013-05-18 DIAGNOSIS — D509 Iron deficiency anemia, unspecified: Secondary | ICD-10-CM | POA: Diagnosis not present

## 2013-05-18 DIAGNOSIS — R634 Abnormal weight loss: Secondary | ICD-10-CM | POA: Diagnosis not present

## 2013-05-21 DIAGNOSIS — N2581 Secondary hyperparathyroidism of renal origin: Secondary | ICD-10-CM | POA: Diagnosis not present

## 2013-05-21 DIAGNOSIS — D631 Anemia in chronic kidney disease: Secondary | ICD-10-CM | POA: Diagnosis not present

## 2013-05-21 DIAGNOSIS — N186 End stage renal disease: Secondary | ICD-10-CM | POA: Diagnosis not present

## 2013-05-21 DIAGNOSIS — D509 Iron deficiency anemia, unspecified: Secondary | ICD-10-CM | POA: Diagnosis not present

## 2013-05-23 DIAGNOSIS — N2581 Secondary hyperparathyroidism of renal origin: Secondary | ICD-10-CM | POA: Diagnosis not present

## 2013-05-23 DIAGNOSIS — N186 End stage renal disease: Secondary | ICD-10-CM | POA: Diagnosis not present

## 2013-05-23 DIAGNOSIS — D509 Iron deficiency anemia, unspecified: Secondary | ICD-10-CM | POA: Diagnosis not present

## 2013-05-23 DIAGNOSIS — D631 Anemia in chronic kidney disease: Secondary | ICD-10-CM | POA: Diagnosis not present

## 2013-05-25 DIAGNOSIS — D631 Anemia in chronic kidney disease: Secondary | ICD-10-CM | POA: Diagnosis not present

## 2013-05-25 DIAGNOSIS — R059 Cough, unspecified: Secondary | ICD-10-CM | POA: Diagnosis not present

## 2013-05-25 DIAGNOSIS — R05 Cough: Secondary | ICD-10-CM | POA: Diagnosis not present

## 2013-05-25 DIAGNOSIS — J40 Bronchitis, not specified as acute or chronic: Secondary | ICD-10-CM | POA: Diagnosis not present

## 2013-05-25 DIAGNOSIS — R509 Fever, unspecified: Secondary | ICD-10-CM | POA: Diagnosis not present

## 2013-05-25 DIAGNOSIS — N2581 Secondary hyperparathyroidism of renal origin: Secondary | ICD-10-CM | POA: Diagnosis not present

## 2013-05-25 DIAGNOSIS — D509 Iron deficiency anemia, unspecified: Secondary | ICD-10-CM | POA: Diagnosis not present

## 2013-05-25 DIAGNOSIS — J309 Allergic rhinitis, unspecified: Secondary | ICD-10-CM | POA: Diagnosis not present

## 2013-05-25 DIAGNOSIS — N186 End stage renal disease: Secondary | ICD-10-CM | POA: Diagnosis not present

## 2013-05-28 DIAGNOSIS — D509 Iron deficiency anemia, unspecified: Secondary | ICD-10-CM | POA: Diagnosis not present

## 2013-05-28 DIAGNOSIS — N2581 Secondary hyperparathyroidism of renal origin: Secondary | ICD-10-CM | POA: Diagnosis not present

## 2013-05-28 DIAGNOSIS — D631 Anemia in chronic kidney disease: Secondary | ICD-10-CM | POA: Diagnosis not present

## 2013-05-28 DIAGNOSIS — N186 End stage renal disease: Secondary | ICD-10-CM | POA: Diagnosis not present

## 2013-05-30 DIAGNOSIS — N186 End stage renal disease: Secondary | ICD-10-CM | POA: Diagnosis not present

## 2013-05-30 DIAGNOSIS — N2581 Secondary hyperparathyroidism of renal origin: Secondary | ICD-10-CM | POA: Diagnosis not present

## 2013-05-30 DIAGNOSIS — D631 Anemia in chronic kidney disease: Secondary | ICD-10-CM | POA: Diagnosis not present

## 2013-05-30 DIAGNOSIS — D509 Iron deficiency anemia, unspecified: Secondary | ICD-10-CM | POA: Diagnosis not present

## 2013-06-03 DIAGNOSIS — N186 End stage renal disease: Secondary | ICD-10-CM | POA: Diagnosis not present

## 2013-06-04 DIAGNOSIS — N186 End stage renal disease: Secondary | ICD-10-CM | POA: Diagnosis not present

## 2013-06-04 DIAGNOSIS — N2581 Secondary hyperparathyroidism of renal origin: Secondary | ICD-10-CM | POA: Diagnosis not present

## 2013-06-04 DIAGNOSIS — D509 Iron deficiency anemia, unspecified: Secondary | ICD-10-CM | POA: Diagnosis not present

## 2013-06-04 DIAGNOSIS — D631 Anemia in chronic kidney disease: Secondary | ICD-10-CM | POA: Diagnosis not present

## 2013-07-04 DIAGNOSIS — N186 End stage renal disease: Secondary | ICD-10-CM | POA: Diagnosis not present

## 2013-07-06 DIAGNOSIS — N039 Chronic nephritic syndrome with unspecified morphologic changes: Secondary | ICD-10-CM | POA: Diagnosis not present

## 2013-07-06 DIAGNOSIS — D631 Anemia in chronic kidney disease: Secondary | ICD-10-CM | POA: Diagnosis not present

## 2013-07-06 DIAGNOSIS — N2581 Secondary hyperparathyroidism of renal origin: Secondary | ICD-10-CM | POA: Diagnosis not present

## 2013-07-06 DIAGNOSIS — D509 Iron deficiency anemia, unspecified: Secondary | ICD-10-CM | POA: Diagnosis not present

## 2013-07-06 DIAGNOSIS — N186 End stage renal disease: Secondary | ICD-10-CM | POA: Diagnosis not present

## 2013-07-09 DIAGNOSIS — N2581 Secondary hyperparathyroidism of renal origin: Secondary | ICD-10-CM | POA: Diagnosis not present

## 2013-07-09 DIAGNOSIS — D631 Anemia in chronic kidney disease: Secondary | ICD-10-CM | POA: Diagnosis not present

## 2013-07-09 DIAGNOSIS — D509 Iron deficiency anemia, unspecified: Secondary | ICD-10-CM | POA: Diagnosis not present

## 2013-07-09 DIAGNOSIS — N186 End stage renal disease: Secondary | ICD-10-CM | POA: Diagnosis not present

## 2013-07-11 DIAGNOSIS — D509 Iron deficiency anemia, unspecified: Secondary | ICD-10-CM | POA: Diagnosis not present

## 2013-07-11 DIAGNOSIS — N186 End stage renal disease: Secondary | ICD-10-CM | POA: Diagnosis not present

## 2013-07-11 DIAGNOSIS — N2581 Secondary hyperparathyroidism of renal origin: Secondary | ICD-10-CM | POA: Diagnosis not present

## 2013-07-11 DIAGNOSIS — D631 Anemia in chronic kidney disease: Secondary | ICD-10-CM | POA: Diagnosis not present

## 2013-07-12 DIAGNOSIS — I519 Heart disease, unspecified: Secondary | ICD-10-CM | POA: Diagnosis not present

## 2013-07-12 DIAGNOSIS — I517 Cardiomegaly: Secondary | ICD-10-CM | POA: Diagnosis not present

## 2013-07-12 DIAGNOSIS — I77810 Thoracic aortic ectasia: Secondary | ICD-10-CM | POA: Diagnosis not present

## 2013-07-12 DIAGNOSIS — Z01818 Encounter for other preprocedural examination: Secondary | ICD-10-CM | POA: Diagnosis not present

## 2013-07-12 DIAGNOSIS — N186 End stage renal disease: Secondary | ICD-10-CM | POA: Diagnosis not present

## 2013-07-13 DIAGNOSIS — D509 Iron deficiency anemia, unspecified: Secondary | ICD-10-CM | POA: Diagnosis not present

## 2013-07-13 DIAGNOSIS — N186 End stage renal disease: Secondary | ICD-10-CM | POA: Diagnosis not present

## 2013-07-13 DIAGNOSIS — D631 Anemia in chronic kidney disease: Secondary | ICD-10-CM | POA: Diagnosis not present

## 2013-07-13 DIAGNOSIS — N039 Chronic nephritic syndrome with unspecified morphologic changes: Secondary | ICD-10-CM | POA: Diagnosis not present

## 2013-07-13 DIAGNOSIS — N2581 Secondary hyperparathyroidism of renal origin: Secondary | ICD-10-CM | POA: Diagnosis not present

## 2013-07-16 DIAGNOSIS — N186 End stage renal disease: Secondary | ICD-10-CM | POA: Diagnosis not present

## 2013-07-16 DIAGNOSIS — N039 Chronic nephritic syndrome with unspecified morphologic changes: Secondary | ICD-10-CM | POA: Diagnosis not present

## 2013-07-16 DIAGNOSIS — N2581 Secondary hyperparathyroidism of renal origin: Secondary | ICD-10-CM | POA: Diagnosis not present

## 2013-07-16 DIAGNOSIS — D509 Iron deficiency anemia, unspecified: Secondary | ICD-10-CM | POA: Diagnosis not present

## 2013-07-16 DIAGNOSIS — D631 Anemia in chronic kidney disease: Secondary | ICD-10-CM | POA: Diagnosis not present

## 2013-07-17 DIAGNOSIS — M87029 Idiopathic aseptic necrosis of unspecified humerus: Secondary | ICD-10-CM | POA: Diagnosis not present

## 2013-07-17 DIAGNOSIS — M87059 Idiopathic aseptic necrosis of unspecified femur: Secondary | ICD-10-CM | POA: Diagnosis not present

## 2013-07-17 DIAGNOSIS — G894 Chronic pain syndrome: Secondary | ICD-10-CM | POA: Diagnosis not present

## 2013-07-17 DIAGNOSIS — Z79899 Other long term (current) drug therapy: Secondary | ICD-10-CM | POA: Diagnosis not present

## 2013-07-18 DIAGNOSIS — N039 Chronic nephritic syndrome with unspecified morphologic changes: Secondary | ICD-10-CM | POA: Diagnosis not present

## 2013-07-18 DIAGNOSIS — D631 Anemia in chronic kidney disease: Secondary | ICD-10-CM | POA: Diagnosis not present

## 2013-07-18 DIAGNOSIS — D509 Iron deficiency anemia, unspecified: Secondary | ICD-10-CM | POA: Diagnosis not present

## 2013-07-18 DIAGNOSIS — N186 End stage renal disease: Secondary | ICD-10-CM | POA: Diagnosis not present

## 2013-07-18 DIAGNOSIS — N2581 Secondary hyperparathyroidism of renal origin: Secondary | ICD-10-CM | POA: Diagnosis not present

## 2013-07-20 DIAGNOSIS — N2581 Secondary hyperparathyroidism of renal origin: Secondary | ICD-10-CM | POA: Diagnosis not present

## 2013-07-20 DIAGNOSIS — N039 Chronic nephritic syndrome with unspecified morphologic changes: Secondary | ICD-10-CM | POA: Diagnosis not present

## 2013-07-20 DIAGNOSIS — D509 Iron deficiency anemia, unspecified: Secondary | ICD-10-CM | POA: Diagnosis not present

## 2013-07-20 DIAGNOSIS — N186 End stage renal disease: Secondary | ICD-10-CM | POA: Diagnosis not present

## 2013-07-20 DIAGNOSIS — D631 Anemia in chronic kidney disease: Secondary | ICD-10-CM | POA: Diagnosis not present

## 2013-07-24 DIAGNOSIS — D631 Anemia in chronic kidney disease: Secondary | ICD-10-CM | POA: Diagnosis not present

## 2013-07-24 DIAGNOSIS — D509 Iron deficiency anemia, unspecified: Secondary | ICD-10-CM | POA: Diagnosis not present

## 2013-07-24 DIAGNOSIS — N2581 Secondary hyperparathyroidism of renal origin: Secondary | ICD-10-CM | POA: Diagnosis not present

## 2013-07-24 DIAGNOSIS — N186 End stage renal disease: Secondary | ICD-10-CM | POA: Diagnosis not present

## 2013-07-26 DIAGNOSIS — D509 Iron deficiency anemia, unspecified: Secondary | ICD-10-CM | POA: Diagnosis not present

## 2013-07-26 DIAGNOSIS — N039 Chronic nephritic syndrome with unspecified morphologic changes: Secondary | ICD-10-CM | POA: Diagnosis not present

## 2013-07-26 DIAGNOSIS — D631 Anemia in chronic kidney disease: Secondary | ICD-10-CM | POA: Diagnosis not present

## 2013-07-26 DIAGNOSIS — N2581 Secondary hyperparathyroidism of renal origin: Secondary | ICD-10-CM | POA: Diagnosis not present

## 2013-07-26 DIAGNOSIS — N186 End stage renal disease: Secondary | ICD-10-CM | POA: Diagnosis not present

## 2013-07-30 DIAGNOSIS — D509 Iron deficiency anemia, unspecified: Secondary | ICD-10-CM | POA: Diagnosis not present

## 2013-07-30 DIAGNOSIS — N186 End stage renal disease: Secondary | ICD-10-CM | POA: Diagnosis not present

## 2013-07-30 DIAGNOSIS — N2581 Secondary hyperparathyroidism of renal origin: Secondary | ICD-10-CM | POA: Diagnosis not present

## 2013-07-30 DIAGNOSIS — D631 Anemia in chronic kidney disease: Secondary | ICD-10-CM | POA: Diagnosis not present

## 2013-08-01 DIAGNOSIS — N2581 Secondary hyperparathyroidism of renal origin: Secondary | ICD-10-CM | POA: Diagnosis not present

## 2013-08-01 DIAGNOSIS — N186 End stage renal disease: Secondary | ICD-10-CM | POA: Diagnosis not present

## 2013-08-01 DIAGNOSIS — D631 Anemia in chronic kidney disease: Secondary | ICD-10-CM | POA: Diagnosis not present

## 2013-08-01 DIAGNOSIS — N039 Chronic nephritic syndrome with unspecified morphologic changes: Secondary | ICD-10-CM | POA: Diagnosis not present

## 2013-08-01 DIAGNOSIS — D509 Iron deficiency anemia, unspecified: Secondary | ICD-10-CM | POA: Diagnosis not present

## 2013-08-03 DIAGNOSIS — R059 Cough, unspecified: Secondary | ICD-10-CM | POA: Diagnosis not present

## 2013-08-03 DIAGNOSIS — R05 Cough: Secondary | ICD-10-CM | POA: Diagnosis not present

## 2013-08-03 DIAGNOSIS — J0100 Acute maxillary sinusitis, unspecified: Secondary | ICD-10-CM | POA: Diagnosis not present

## 2013-08-04 DIAGNOSIS — N186 End stage renal disease: Secondary | ICD-10-CM | POA: Diagnosis not present

## 2013-08-06 DIAGNOSIS — D631 Anemia in chronic kidney disease: Secondary | ICD-10-CM | POA: Diagnosis not present

## 2013-08-06 DIAGNOSIS — N186 End stage renal disease: Secondary | ICD-10-CM | POA: Diagnosis not present

## 2013-08-06 DIAGNOSIS — Z23 Encounter for immunization: Secondary | ICD-10-CM | POA: Diagnosis not present

## 2013-08-06 DIAGNOSIS — N2581 Secondary hyperparathyroidism of renal origin: Secondary | ICD-10-CM | POA: Diagnosis not present

## 2013-08-06 DIAGNOSIS — E875 Hyperkalemia: Secondary | ICD-10-CM | POA: Diagnosis not present

## 2013-09-01 DIAGNOSIS — N186 End stage renal disease: Secondary | ICD-10-CM | POA: Diagnosis not present

## 2013-09-03 DIAGNOSIS — N2581 Secondary hyperparathyroidism of renal origin: Secondary | ICD-10-CM | POA: Diagnosis not present

## 2013-09-03 DIAGNOSIS — N186 End stage renal disease: Secondary | ICD-10-CM | POA: Diagnosis not present

## 2013-09-03 DIAGNOSIS — D631 Anemia in chronic kidney disease: Secondary | ICD-10-CM | POA: Diagnosis not present

## 2013-09-12 DIAGNOSIS — J209 Acute bronchitis, unspecified: Secondary | ICD-10-CM | POA: Diagnosis not present

## 2013-09-25 DIAGNOSIS — N186 End stage renal disease: Secondary | ICD-10-CM | POA: Diagnosis not present

## 2013-09-25 DIAGNOSIS — N281 Cyst of kidney, acquired: Secondary | ICD-10-CM | POA: Diagnosis not present

## 2013-09-25 DIAGNOSIS — N269 Renal sclerosis, unspecified: Secondary | ICD-10-CM | POA: Diagnosis not present

## 2013-09-25 DIAGNOSIS — N058 Unspecified nephritic syndrome with other morphologic changes: Secondary | ICD-10-CM | POA: Diagnosis not present

## 2013-10-02 DIAGNOSIS — N186 End stage renal disease: Secondary | ICD-10-CM | POA: Diagnosis not present

## 2013-10-03 DIAGNOSIS — D509 Iron deficiency anemia, unspecified: Secondary | ICD-10-CM | POA: Diagnosis not present

## 2013-10-03 DIAGNOSIS — N186 End stage renal disease: Secondary | ICD-10-CM | POA: Diagnosis not present

## 2013-10-03 DIAGNOSIS — D631 Anemia in chronic kidney disease: Secondary | ICD-10-CM | POA: Diagnosis not present

## 2013-10-03 DIAGNOSIS — N2581 Secondary hyperparathyroidism of renal origin: Secondary | ICD-10-CM | POA: Diagnosis not present

## 2013-10-10 DIAGNOSIS — J029 Acute pharyngitis, unspecified: Secondary | ICD-10-CM | POA: Diagnosis not present

## 2013-10-11 DIAGNOSIS — M87029 Idiopathic aseptic necrosis of unspecified humerus: Secondary | ICD-10-CM | POA: Diagnosis not present

## 2013-10-11 DIAGNOSIS — Z79899 Other long term (current) drug therapy: Secondary | ICD-10-CM | POA: Diagnosis not present

## 2013-10-11 DIAGNOSIS — M87059 Idiopathic aseptic necrosis of unspecified femur: Secondary | ICD-10-CM | POA: Diagnosis not present

## 2013-10-11 DIAGNOSIS — G894 Chronic pain syndrome: Secondary | ICD-10-CM | POA: Diagnosis not present

## 2013-11-01 DIAGNOSIS — N186 End stage renal disease: Secondary | ICD-10-CM | POA: Diagnosis not present

## 2013-11-02 DIAGNOSIS — D509 Iron deficiency anemia, unspecified: Secondary | ICD-10-CM | POA: Diagnosis not present

## 2013-11-02 DIAGNOSIS — N2581 Secondary hyperparathyroidism of renal origin: Secondary | ICD-10-CM | POA: Diagnosis not present

## 2013-11-02 DIAGNOSIS — N186 End stage renal disease: Secondary | ICD-10-CM | POA: Diagnosis not present

## 2013-11-02 DIAGNOSIS — D631 Anemia in chronic kidney disease: Secondary | ICD-10-CM | POA: Diagnosis not present

## 2013-11-06 DIAGNOSIS — M25569 Pain in unspecified knee: Secondary | ICD-10-CM | POA: Diagnosis not present

## 2013-11-28 DIAGNOSIS — J209 Acute bronchitis, unspecified: Secondary | ICD-10-CM | POA: Diagnosis not present

## 2013-12-02 DIAGNOSIS — N186 End stage renal disease: Secondary | ICD-10-CM | POA: Diagnosis not present

## 2013-12-03 DIAGNOSIS — D509 Iron deficiency anemia, unspecified: Secondary | ICD-10-CM | POA: Diagnosis not present

## 2013-12-03 DIAGNOSIS — N2581 Secondary hyperparathyroidism of renal origin: Secondary | ICD-10-CM | POA: Diagnosis not present

## 2013-12-03 DIAGNOSIS — D631 Anemia in chronic kidney disease: Secondary | ICD-10-CM | POA: Diagnosis not present

## 2013-12-03 DIAGNOSIS — N186 End stage renal disease: Secondary | ICD-10-CM | POA: Diagnosis not present

## 2014-01-01 DIAGNOSIS — N186 End stage renal disease: Secondary | ICD-10-CM | POA: Diagnosis not present

## 2014-01-01 DIAGNOSIS — R059 Cough, unspecified: Secondary | ICD-10-CM | POA: Diagnosis not present

## 2014-01-01 DIAGNOSIS — R05 Cough: Secondary | ICD-10-CM | POA: Diagnosis not present

## 2014-01-02 DIAGNOSIS — N2581 Secondary hyperparathyroidism of renal origin: Secondary | ICD-10-CM | POA: Diagnosis not present

## 2014-01-02 DIAGNOSIS — N186 End stage renal disease: Secondary | ICD-10-CM | POA: Diagnosis not present

## 2014-01-02 DIAGNOSIS — D631 Anemia in chronic kidney disease: Secondary | ICD-10-CM | POA: Diagnosis not present

## 2014-01-02 DIAGNOSIS — D509 Iron deficiency anemia, unspecified: Secondary | ICD-10-CM | POA: Diagnosis not present

## 2014-01-11 DIAGNOSIS — I10 Essential (primary) hypertension: Secondary | ICD-10-CM | POA: Diagnosis not present

## 2014-01-11 DIAGNOSIS — N529 Male erectile dysfunction, unspecified: Secondary | ICD-10-CM | POA: Diagnosis not present

## 2014-01-11 DIAGNOSIS — R351 Nocturia: Secondary | ICD-10-CM | POA: Diagnosis not present

## 2014-01-11 DIAGNOSIS — N189 Chronic kidney disease, unspecified: Secondary | ICD-10-CM | POA: Diagnosis not present

## 2014-01-15 DIAGNOSIS — J45991 Cough variant asthma: Secondary | ICD-10-CM | POA: Diagnosis not present

## 2014-01-15 DIAGNOSIS — R05 Cough: Secondary | ICD-10-CM | POA: Diagnosis not present

## 2014-01-15 DIAGNOSIS — R059 Cough, unspecified: Secondary | ICD-10-CM | POA: Diagnosis not present

## 2014-02-01 DIAGNOSIS — N186 End stage renal disease: Secondary | ICD-10-CM | POA: Diagnosis not present

## 2014-02-04 DIAGNOSIS — D631 Anemia in chronic kidney disease: Secondary | ICD-10-CM | POA: Diagnosis not present

## 2014-02-04 DIAGNOSIS — N2581 Secondary hyperparathyroidism of renal origin: Secondary | ICD-10-CM | POA: Diagnosis not present

## 2014-02-04 DIAGNOSIS — N186 End stage renal disease: Secondary | ICD-10-CM | POA: Diagnosis not present

## 2014-02-05 DIAGNOSIS — M87059 Idiopathic aseptic necrosis of unspecified femur: Secondary | ICD-10-CM | POA: Diagnosis not present

## 2014-02-05 DIAGNOSIS — Z79899 Other long term (current) drug therapy: Secondary | ICD-10-CM | POA: Diagnosis not present

## 2014-02-05 DIAGNOSIS — M87029 Idiopathic aseptic necrosis of unspecified humerus: Secondary | ICD-10-CM | POA: Diagnosis not present

## 2014-02-05 DIAGNOSIS — G894 Chronic pain syndrome: Secondary | ICD-10-CM | POA: Diagnosis not present

## 2014-02-06 DIAGNOSIS — D631 Anemia in chronic kidney disease: Secondary | ICD-10-CM | POA: Diagnosis not present

## 2014-02-06 DIAGNOSIS — N039 Chronic nephritic syndrome with unspecified morphologic changes: Secondary | ICD-10-CM | POA: Diagnosis not present

## 2014-02-06 DIAGNOSIS — N2581 Secondary hyperparathyroidism of renal origin: Secondary | ICD-10-CM | POA: Diagnosis not present

## 2014-02-06 DIAGNOSIS — N186 End stage renal disease: Secondary | ICD-10-CM | POA: Diagnosis not present

## 2014-02-08 DIAGNOSIS — D631 Anemia in chronic kidney disease: Secondary | ICD-10-CM | POA: Diagnosis not present

## 2014-02-08 DIAGNOSIS — N2581 Secondary hyperparathyroidism of renal origin: Secondary | ICD-10-CM | POA: Diagnosis not present

## 2014-02-08 DIAGNOSIS — N186 End stage renal disease: Secondary | ICD-10-CM | POA: Diagnosis not present

## 2014-02-11 DIAGNOSIS — N2581 Secondary hyperparathyroidism of renal origin: Secondary | ICD-10-CM | POA: Diagnosis not present

## 2014-02-11 DIAGNOSIS — D631 Anemia in chronic kidney disease: Secondary | ICD-10-CM | POA: Diagnosis not present

## 2014-02-11 DIAGNOSIS — N186 End stage renal disease: Secondary | ICD-10-CM | POA: Diagnosis not present

## 2014-02-13 DIAGNOSIS — D631 Anemia in chronic kidney disease: Secondary | ICD-10-CM | POA: Diagnosis not present

## 2014-02-13 DIAGNOSIS — N186 End stage renal disease: Secondary | ICD-10-CM | POA: Diagnosis not present

## 2014-02-13 DIAGNOSIS — N2581 Secondary hyperparathyroidism of renal origin: Secondary | ICD-10-CM | POA: Diagnosis not present

## 2014-02-18 DIAGNOSIS — N186 End stage renal disease: Secondary | ICD-10-CM | POA: Diagnosis not present

## 2014-02-18 DIAGNOSIS — N2581 Secondary hyperparathyroidism of renal origin: Secondary | ICD-10-CM | POA: Diagnosis not present

## 2014-02-18 DIAGNOSIS — D631 Anemia in chronic kidney disease: Secondary | ICD-10-CM | POA: Diagnosis not present

## 2014-02-20 DIAGNOSIS — D631 Anemia in chronic kidney disease: Secondary | ICD-10-CM | POA: Diagnosis not present

## 2014-02-20 DIAGNOSIS — N186 End stage renal disease: Secondary | ICD-10-CM | POA: Diagnosis not present

## 2014-02-20 DIAGNOSIS — N2581 Secondary hyperparathyroidism of renal origin: Secondary | ICD-10-CM | POA: Diagnosis not present

## 2014-02-22 DIAGNOSIS — D631 Anemia in chronic kidney disease: Secondary | ICD-10-CM | POA: Diagnosis not present

## 2014-02-22 DIAGNOSIS — N039 Chronic nephritic syndrome with unspecified morphologic changes: Secondary | ICD-10-CM | POA: Diagnosis not present

## 2014-02-22 DIAGNOSIS — N186 End stage renal disease: Secondary | ICD-10-CM | POA: Diagnosis not present

## 2014-02-22 DIAGNOSIS — N2581 Secondary hyperparathyroidism of renal origin: Secondary | ICD-10-CM | POA: Diagnosis not present

## 2014-02-25 DIAGNOSIS — N2581 Secondary hyperparathyroidism of renal origin: Secondary | ICD-10-CM | POA: Diagnosis not present

## 2014-02-25 DIAGNOSIS — D631 Anemia in chronic kidney disease: Secondary | ICD-10-CM | POA: Diagnosis not present

## 2014-02-25 DIAGNOSIS — N186 End stage renal disease: Secondary | ICD-10-CM | POA: Diagnosis not present

## 2014-02-26 DIAGNOSIS — J3089 Other allergic rhinitis: Secondary | ICD-10-CM | POA: Diagnosis not present

## 2014-02-26 DIAGNOSIS — R05 Cough: Secondary | ICD-10-CM | POA: Diagnosis not present

## 2014-02-26 DIAGNOSIS — G4730 Sleep apnea, unspecified: Secondary | ICD-10-CM | POA: Diagnosis not present

## 2014-02-26 DIAGNOSIS — G4700 Insomnia, unspecified: Secondary | ICD-10-CM | POA: Diagnosis not present

## 2014-02-26 DIAGNOSIS — R059 Cough, unspecified: Secondary | ICD-10-CM | POA: Diagnosis not present

## 2014-02-27 DIAGNOSIS — D631 Anemia in chronic kidney disease: Secondary | ICD-10-CM | POA: Diagnosis not present

## 2014-02-27 DIAGNOSIS — N186 End stage renal disease: Secondary | ICD-10-CM | POA: Diagnosis not present

## 2014-02-27 DIAGNOSIS — N039 Chronic nephritic syndrome with unspecified morphologic changes: Secondary | ICD-10-CM | POA: Diagnosis not present

## 2014-02-27 DIAGNOSIS — N2581 Secondary hyperparathyroidism of renal origin: Secondary | ICD-10-CM | POA: Diagnosis not present

## 2014-03-04 DIAGNOSIS — N039 Chronic nephritic syndrome with unspecified morphologic changes: Secondary | ICD-10-CM | POA: Diagnosis not present

## 2014-03-04 DIAGNOSIS — N2581 Secondary hyperparathyroidism of renal origin: Secondary | ICD-10-CM | POA: Diagnosis not present

## 2014-03-04 DIAGNOSIS — N186 End stage renal disease: Secondary | ICD-10-CM | POA: Diagnosis not present

## 2014-03-04 DIAGNOSIS — D631 Anemia in chronic kidney disease: Secondary | ICD-10-CM | POA: Diagnosis not present

## 2014-03-06 DIAGNOSIS — N186 End stage renal disease: Secondary | ICD-10-CM | POA: Diagnosis not present

## 2014-03-06 DIAGNOSIS — N2581 Secondary hyperparathyroidism of renal origin: Secondary | ICD-10-CM | POA: Diagnosis not present

## 2014-03-06 DIAGNOSIS — D631 Anemia in chronic kidney disease: Secondary | ICD-10-CM | POA: Diagnosis not present

## 2014-03-06 DIAGNOSIS — N039 Chronic nephritic syndrome with unspecified morphologic changes: Secondary | ICD-10-CM | POA: Diagnosis not present

## 2014-03-08 DIAGNOSIS — N2581 Secondary hyperparathyroidism of renal origin: Secondary | ICD-10-CM | POA: Diagnosis not present

## 2014-03-08 DIAGNOSIS — D631 Anemia in chronic kidney disease: Secondary | ICD-10-CM | POA: Diagnosis not present

## 2014-03-08 DIAGNOSIS — N186 End stage renal disease: Secondary | ICD-10-CM | POA: Diagnosis not present

## 2014-03-10 DIAGNOSIS — J029 Acute pharyngitis, unspecified: Secondary | ICD-10-CM | POA: Diagnosis not present

## 2014-03-10 DIAGNOSIS — J019 Acute sinusitis, unspecified: Secondary | ICD-10-CM | POA: Diagnosis not present

## 2014-03-11 DIAGNOSIS — N2581 Secondary hyperparathyroidism of renal origin: Secondary | ICD-10-CM | POA: Diagnosis not present

## 2014-03-11 DIAGNOSIS — N186 End stage renal disease: Secondary | ICD-10-CM | POA: Diagnosis not present

## 2014-03-11 DIAGNOSIS — D631 Anemia in chronic kidney disease: Secondary | ICD-10-CM | POA: Diagnosis not present

## 2014-03-13 DIAGNOSIS — N2581 Secondary hyperparathyroidism of renal origin: Secondary | ICD-10-CM | POA: Diagnosis not present

## 2014-03-13 DIAGNOSIS — D631 Anemia in chronic kidney disease: Secondary | ICD-10-CM | POA: Diagnosis not present

## 2014-03-13 DIAGNOSIS — N186 End stage renal disease: Secondary | ICD-10-CM | POA: Diagnosis not present

## 2014-03-14 DIAGNOSIS — J029 Acute pharyngitis, unspecified: Secondary | ICD-10-CM | POA: Diagnosis not present

## 2014-03-15 DIAGNOSIS — D631 Anemia in chronic kidney disease: Secondary | ICD-10-CM | POA: Diagnosis not present

## 2014-03-15 DIAGNOSIS — N2581 Secondary hyperparathyroidism of renal origin: Secondary | ICD-10-CM | POA: Diagnosis not present

## 2014-03-15 DIAGNOSIS — N186 End stage renal disease: Secondary | ICD-10-CM | POA: Diagnosis not present

## 2014-03-18 DIAGNOSIS — N2581 Secondary hyperparathyroidism of renal origin: Secondary | ICD-10-CM | POA: Diagnosis not present

## 2014-03-18 DIAGNOSIS — D631 Anemia in chronic kidney disease: Secondary | ICD-10-CM | POA: Diagnosis not present

## 2014-03-18 DIAGNOSIS — N186 End stage renal disease: Secondary | ICD-10-CM | POA: Diagnosis not present

## 2014-03-20 DIAGNOSIS — N2581 Secondary hyperparathyroidism of renal origin: Secondary | ICD-10-CM | POA: Diagnosis not present

## 2014-03-20 DIAGNOSIS — D631 Anemia in chronic kidney disease: Secondary | ICD-10-CM | POA: Diagnosis not present

## 2014-03-20 DIAGNOSIS — N186 End stage renal disease: Secondary | ICD-10-CM | POA: Diagnosis not present

## 2014-03-22 DIAGNOSIS — N186 End stage renal disease: Secondary | ICD-10-CM | POA: Diagnosis not present

## 2014-03-22 DIAGNOSIS — N2581 Secondary hyperparathyroidism of renal origin: Secondary | ICD-10-CM | POA: Diagnosis not present

## 2014-03-22 DIAGNOSIS — D631 Anemia in chronic kidney disease: Secondary | ICD-10-CM | POA: Diagnosis not present

## 2014-03-25 DIAGNOSIS — N186 End stage renal disease: Secondary | ICD-10-CM | POA: Diagnosis not present

## 2014-03-25 DIAGNOSIS — N2581 Secondary hyperparathyroidism of renal origin: Secondary | ICD-10-CM | POA: Diagnosis not present

## 2014-03-25 DIAGNOSIS — D631 Anemia in chronic kidney disease: Secondary | ICD-10-CM | POA: Diagnosis not present

## 2014-03-27 DIAGNOSIS — N186 End stage renal disease: Secondary | ICD-10-CM | POA: Diagnosis not present

## 2014-03-27 DIAGNOSIS — N039 Chronic nephritic syndrome with unspecified morphologic changes: Secondary | ICD-10-CM | POA: Diagnosis not present

## 2014-03-27 DIAGNOSIS — N2581 Secondary hyperparathyroidism of renal origin: Secondary | ICD-10-CM | POA: Diagnosis not present

## 2014-03-27 DIAGNOSIS — D631 Anemia in chronic kidney disease: Secondary | ICD-10-CM | POA: Diagnosis not present

## 2014-03-29 DIAGNOSIS — N186 End stage renal disease: Secondary | ICD-10-CM | POA: Diagnosis not present

## 2014-03-29 DIAGNOSIS — D631 Anemia in chronic kidney disease: Secondary | ICD-10-CM | POA: Diagnosis not present

## 2014-03-29 DIAGNOSIS — N2581 Secondary hyperparathyroidism of renal origin: Secondary | ICD-10-CM | POA: Diagnosis not present

## 2014-04-01 DIAGNOSIS — N186 End stage renal disease: Secondary | ICD-10-CM | POA: Diagnosis not present

## 2014-04-01 DIAGNOSIS — N2581 Secondary hyperparathyroidism of renal origin: Secondary | ICD-10-CM | POA: Diagnosis not present

## 2014-04-01 DIAGNOSIS — D631 Anemia in chronic kidney disease: Secondary | ICD-10-CM | POA: Diagnosis not present

## 2014-04-03 DIAGNOSIS — D631 Anemia in chronic kidney disease: Secondary | ICD-10-CM | POA: Diagnosis not present

## 2014-04-03 DIAGNOSIS — N186 End stage renal disease: Secondary | ICD-10-CM | POA: Diagnosis not present

## 2014-04-03 DIAGNOSIS — N2581 Secondary hyperparathyroidism of renal origin: Secondary | ICD-10-CM | POA: Diagnosis not present

## 2014-04-03 DIAGNOSIS — N039 Chronic nephritic syndrome with unspecified morphologic changes: Secondary | ICD-10-CM | POA: Diagnosis not present

## 2014-04-05 DIAGNOSIS — N186 End stage renal disease: Secondary | ICD-10-CM | POA: Diagnosis not present

## 2014-04-05 DIAGNOSIS — N2581 Secondary hyperparathyroidism of renal origin: Secondary | ICD-10-CM | POA: Diagnosis not present

## 2014-04-05 DIAGNOSIS — Z23 Encounter for immunization: Secondary | ICD-10-CM | POA: Diagnosis not present

## 2014-04-05 DIAGNOSIS — D631 Anemia in chronic kidney disease: Secondary | ICD-10-CM | POA: Diagnosis not present

## 2014-04-18 IMAGING — US US SOFT TISSUE HEAD/NECK
1 series · 14 of 25 positions shown · non-contrast
Comparison: None.

CLINICAL DATA: Right neck swelling

THYROID ULTRASOUND
TECHNIQUE: Ultrasound examination of the thyroid gland and adjacent
soft tissues was performed.

[Series 1: us soft tissue head/neck · 0.05mm/px · 14 of 51 slices shown]
[im 1/51]
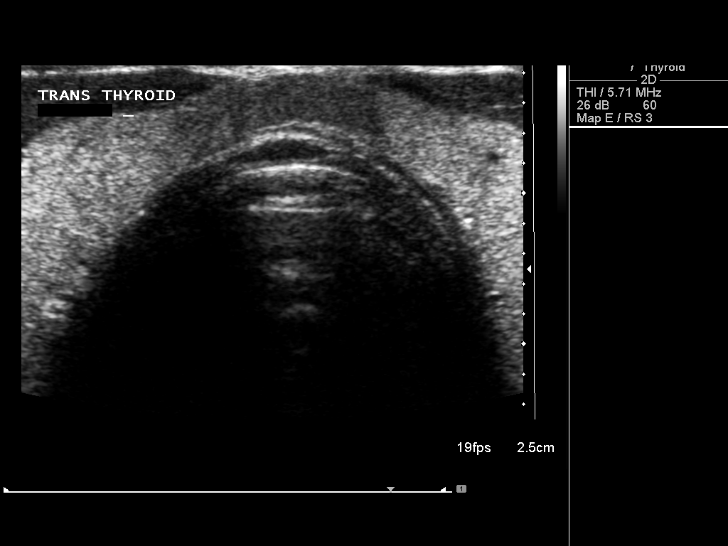
[im 5/51]
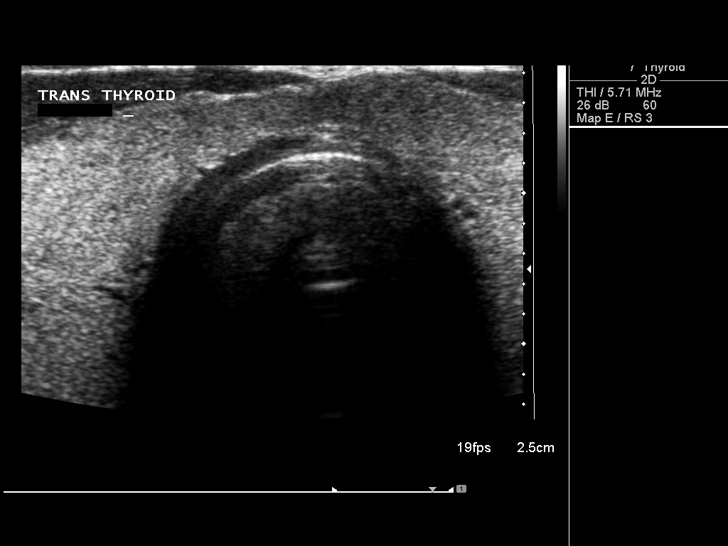
[im 9/51]
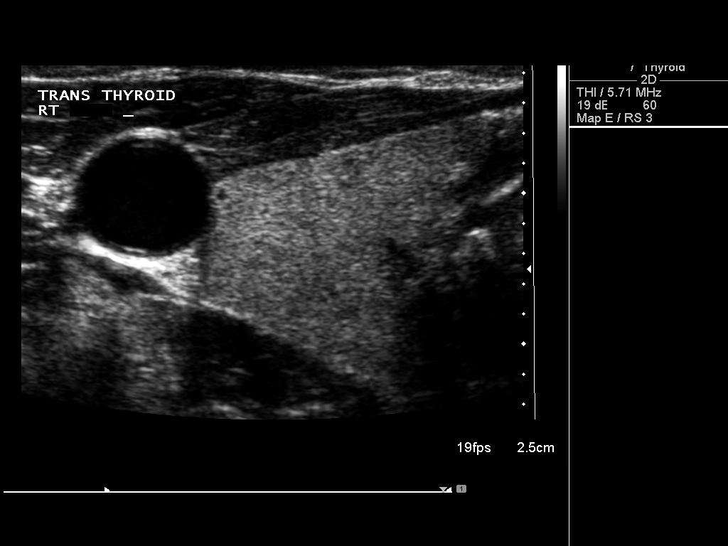
[im 13/51]
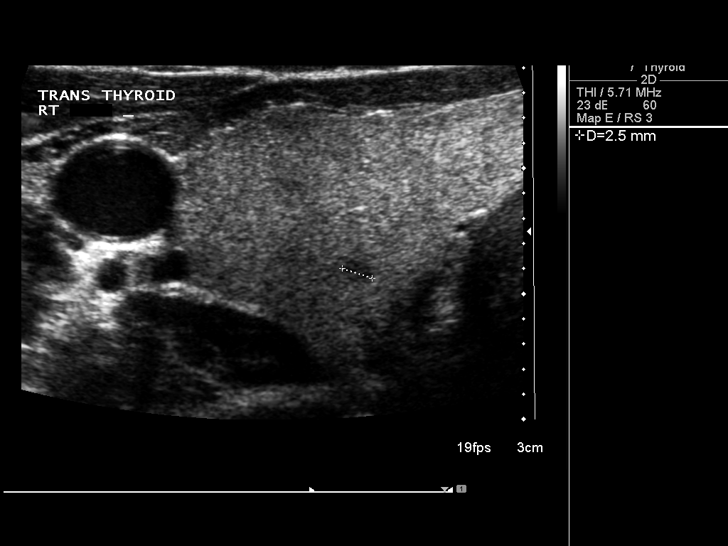
[im 17/51]
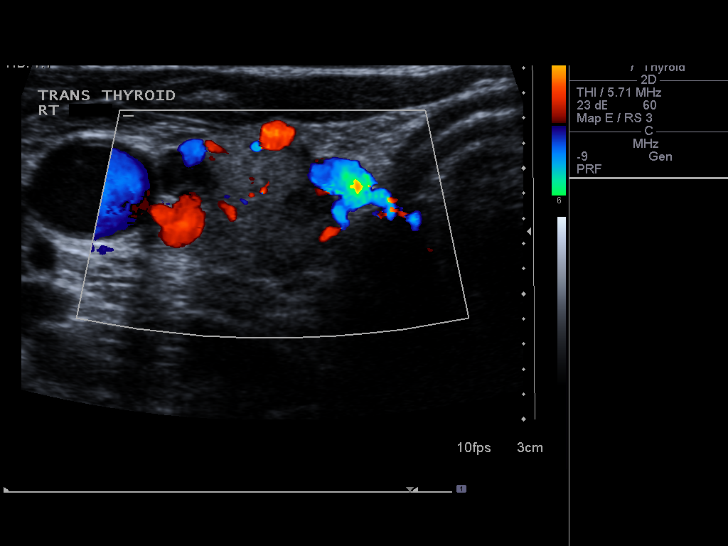
[im 19/51]
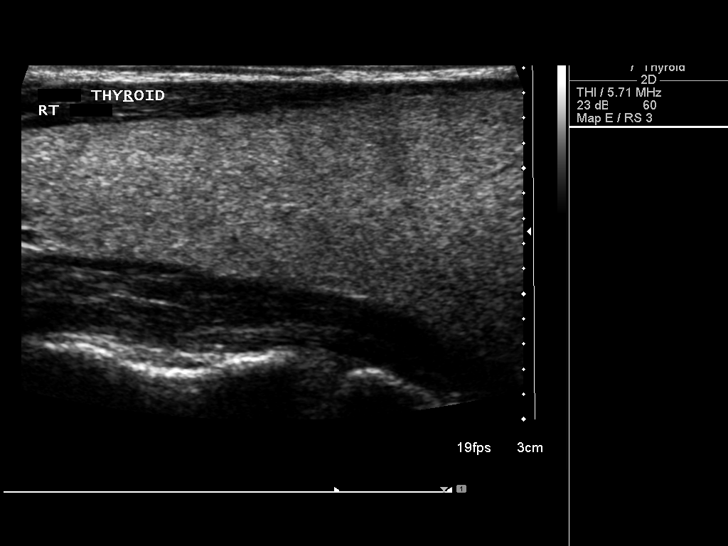
[im 23/51]
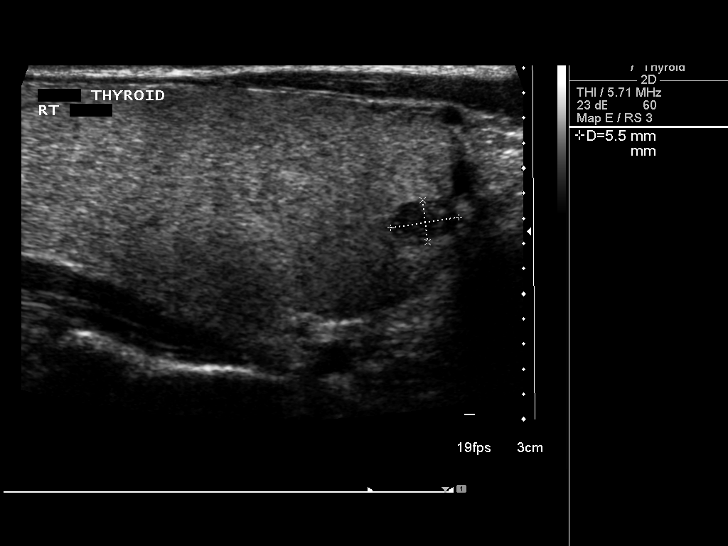
[im 28/51]
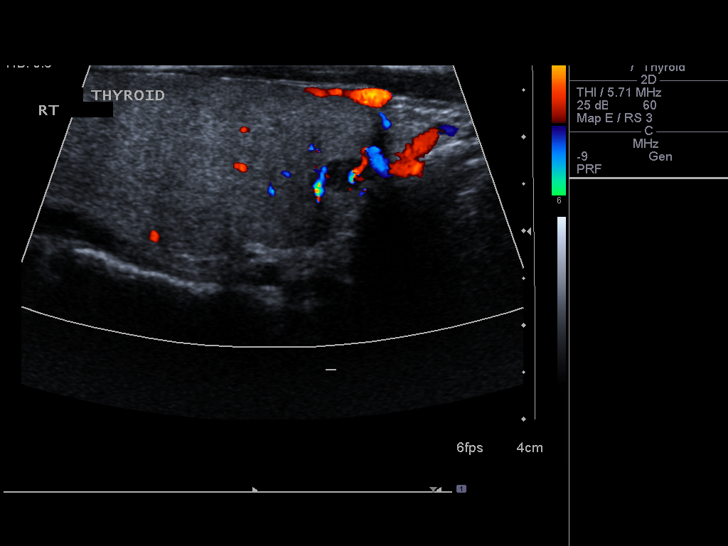
[im 32/51]
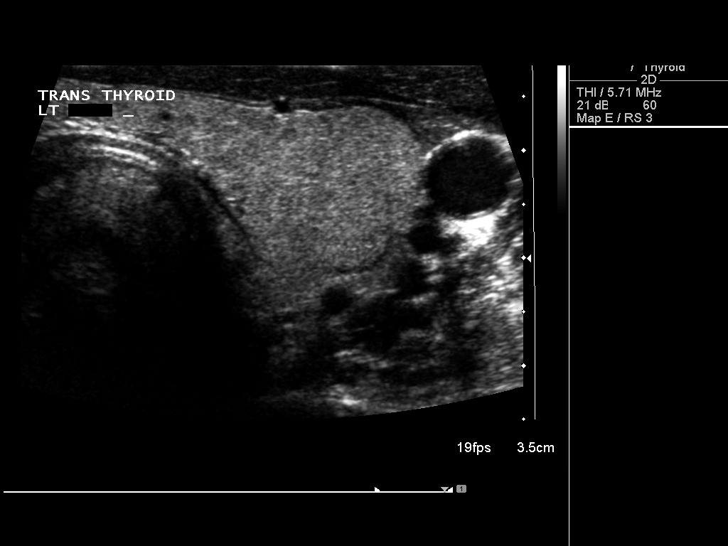
[im 34/51]
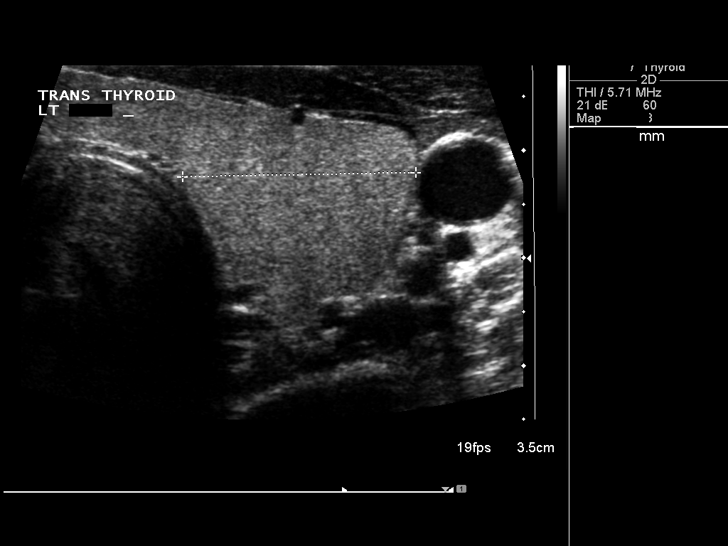
[im 38/51]
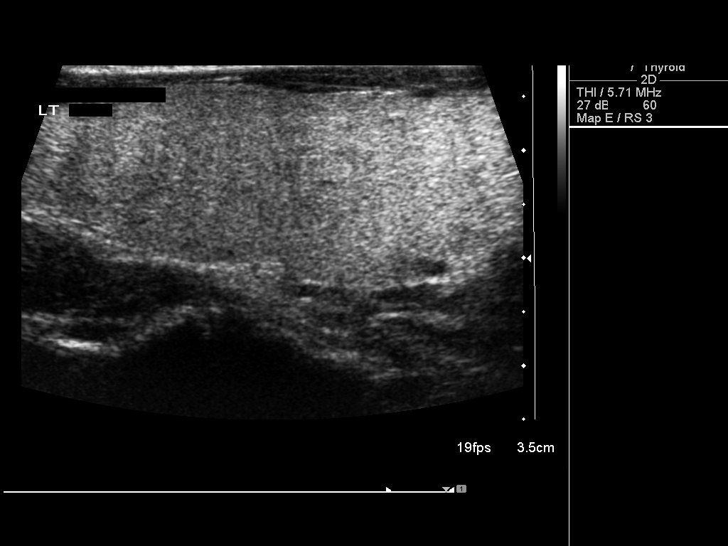
[im 42/51]
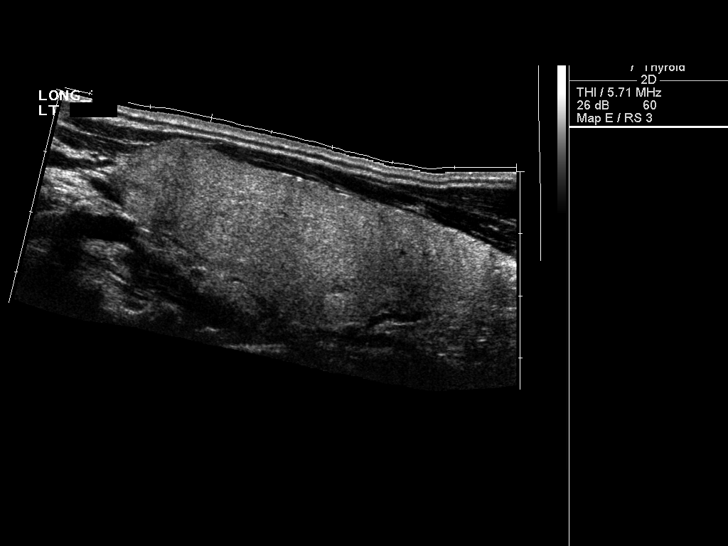
[im 46/51]
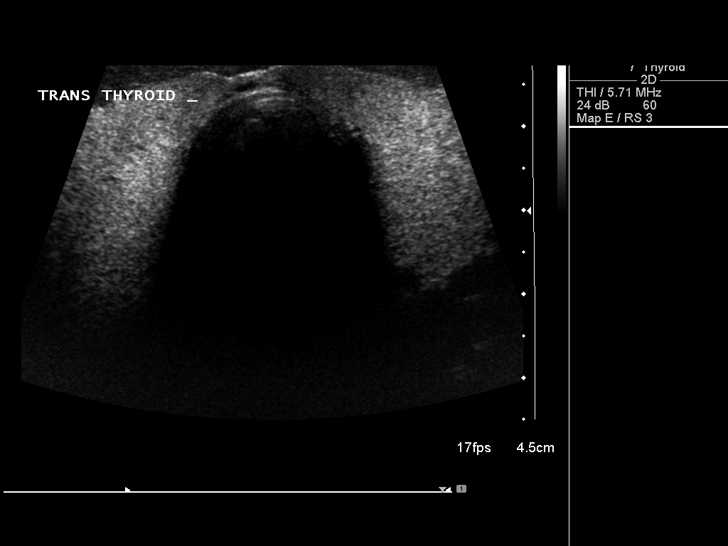
[im 51/51]
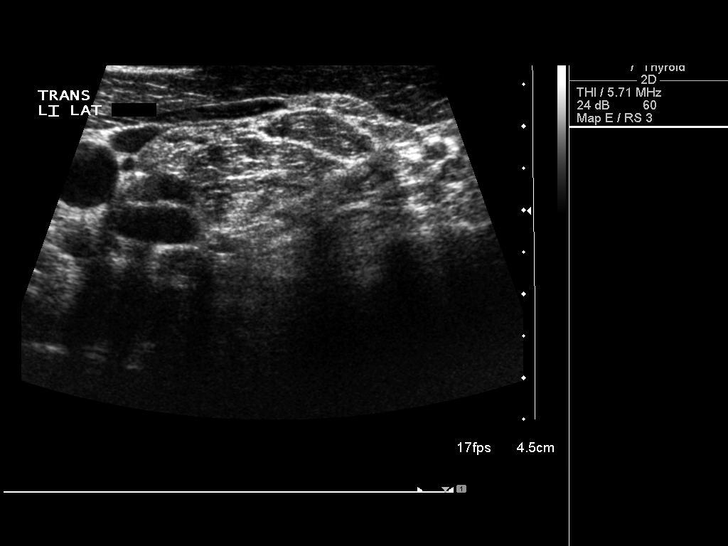

[14 of 25 positions shown; findings below may reference images not displayed]

FINDINGS: Right thyroid lobe:  6.4 x 2.2 x 2.6 cm.  Slightly heterogeneous
echotexture.
Left thyroid lobe:  6.9 x 2.2 x 2.2 cm.  Slightly heterogeneous
echotexture.
Isthmus:  5 mm in thickness.

Focal nodules:  Sub centimeter hypoechoic nodules in the right lobe
without definite microcalcifications.

Lymphadenopathy:  None visualized.
IMPRESSION: Sub centimeter right lobe nodules.  None meet criteria for biopsy.
Follow-up is recommended.

## 2014-04-30 DIAGNOSIS — M87059 Idiopathic aseptic necrosis of unspecified femur: Secondary | ICD-10-CM | POA: Diagnosis not present

## 2014-04-30 DIAGNOSIS — M87022 Idiopathic aseptic necrosis of left humerus: Secondary | ICD-10-CM | POA: Diagnosis not present

## 2014-04-30 DIAGNOSIS — Z79891 Long term (current) use of opiate analgesic: Secondary | ICD-10-CM | POA: Diagnosis not present

## 2014-04-30 DIAGNOSIS — M87021 Idiopathic aseptic necrosis of right humerus: Secondary | ICD-10-CM | POA: Diagnosis not present

## 2014-05-04 DIAGNOSIS — N186 End stage renal disease: Secondary | ICD-10-CM | POA: Diagnosis not present

## 2014-05-04 DIAGNOSIS — Z992 Dependence on renal dialysis: Secondary | ICD-10-CM | POA: Diagnosis not present

## 2014-05-06 DIAGNOSIS — D631 Anemia in chronic kidney disease: Secondary | ICD-10-CM | POA: Diagnosis not present

## 2014-05-06 DIAGNOSIS — N186 End stage renal disease: Secondary | ICD-10-CM | POA: Diagnosis not present

## 2014-05-06 DIAGNOSIS — N2581 Secondary hyperparathyroidism of renal origin: Secondary | ICD-10-CM | POA: Diagnosis not present

## 2014-05-06 DIAGNOSIS — D509 Iron deficiency anemia, unspecified: Secondary | ICD-10-CM | POA: Diagnosis not present

## 2014-06-03 DIAGNOSIS — N186 End stage renal disease: Secondary | ICD-10-CM | POA: Diagnosis not present

## 2014-06-03 DIAGNOSIS — Z992 Dependence on renal dialysis: Secondary | ICD-10-CM | POA: Diagnosis not present

## 2014-06-05 DIAGNOSIS — N2581 Secondary hyperparathyroidism of renal origin: Secondary | ICD-10-CM | POA: Diagnosis not present

## 2014-06-05 DIAGNOSIS — N186 End stage renal disease: Secondary | ICD-10-CM | POA: Diagnosis not present

## 2014-06-05 DIAGNOSIS — D631 Anemia in chronic kidney disease: Secondary | ICD-10-CM | POA: Diagnosis not present

## 2014-06-05 DIAGNOSIS — D509 Iron deficiency anemia, unspecified: Secondary | ICD-10-CM | POA: Diagnosis not present

## 2014-06-07 DIAGNOSIS — D509 Iron deficiency anemia, unspecified: Secondary | ICD-10-CM | POA: Diagnosis not present

## 2014-06-07 DIAGNOSIS — N2581 Secondary hyperparathyroidism of renal origin: Secondary | ICD-10-CM | POA: Diagnosis not present

## 2014-06-07 DIAGNOSIS — D631 Anemia in chronic kidney disease: Secondary | ICD-10-CM | POA: Diagnosis not present

## 2014-06-07 DIAGNOSIS — N186 End stage renal disease: Secondary | ICD-10-CM | POA: Diagnosis not present

## 2014-06-10 DIAGNOSIS — N2581 Secondary hyperparathyroidism of renal origin: Secondary | ICD-10-CM | POA: Diagnosis not present

## 2014-06-10 DIAGNOSIS — N186 End stage renal disease: Secondary | ICD-10-CM | POA: Diagnosis not present

## 2014-06-10 DIAGNOSIS — D631 Anemia in chronic kidney disease: Secondary | ICD-10-CM | POA: Diagnosis not present

## 2014-06-10 DIAGNOSIS — D509 Iron deficiency anemia, unspecified: Secondary | ICD-10-CM | POA: Diagnosis not present

## 2014-06-12 DIAGNOSIS — D631 Anemia in chronic kidney disease: Secondary | ICD-10-CM | POA: Diagnosis not present

## 2014-06-12 DIAGNOSIS — N186 End stage renal disease: Secondary | ICD-10-CM | POA: Diagnosis not present

## 2014-06-12 DIAGNOSIS — N2581 Secondary hyperparathyroidism of renal origin: Secondary | ICD-10-CM | POA: Diagnosis not present

## 2014-06-12 DIAGNOSIS — D509 Iron deficiency anemia, unspecified: Secondary | ICD-10-CM | POA: Diagnosis not present

## 2014-06-17 DIAGNOSIS — D631 Anemia in chronic kidney disease: Secondary | ICD-10-CM | POA: Diagnosis not present

## 2014-06-17 DIAGNOSIS — N186 End stage renal disease: Secondary | ICD-10-CM | POA: Diagnosis not present

## 2014-06-17 DIAGNOSIS — N2581 Secondary hyperparathyroidism of renal origin: Secondary | ICD-10-CM | POA: Diagnosis not present

## 2014-06-17 DIAGNOSIS — D509 Iron deficiency anemia, unspecified: Secondary | ICD-10-CM | POA: Diagnosis not present

## 2014-06-19 DIAGNOSIS — N186 End stage renal disease: Secondary | ICD-10-CM | POA: Diagnosis not present

## 2014-06-19 DIAGNOSIS — N2581 Secondary hyperparathyroidism of renal origin: Secondary | ICD-10-CM | POA: Diagnosis not present

## 2014-06-19 DIAGNOSIS — D509 Iron deficiency anemia, unspecified: Secondary | ICD-10-CM | POA: Diagnosis not present

## 2014-06-19 DIAGNOSIS — D631 Anemia in chronic kidney disease: Secondary | ICD-10-CM | POA: Diagnosis not present

## 2014-06-21 DIAGNOSIS — D509 Iron deficiency anemia, unspecified: Secondary | ICD-10-CM | POA: Diagnosis not present

## 2014-06-21 DIAGNOSIS — D631 Anemia in chronic kidney disease: Secondary | ICD-10-CM | POA: Diagnosis not present

## 2014-06-21 DIAGNOSIS — N2581 Secondary hyperparathyroidism of renal origin: Secondary | ICD-10-CM | POA: Diagnosis not present

## 2014-06-21 DIAGNOSIS — N186 End stage renal disease: Secondary | ICD-10-CM | POA: Diagnosis not present

## 2014-06-24 DIAGNOSIS — N186 End stage renal disease: Secondary | ICD-10-CM | POA: Diagnosis not present

## 2014-06-24 DIAGNOSIS — D631 Anemia in chronic kidney disease: Secondary | ICD-10-CM | POA: Diagnosis not present

## 2014-06-24 DIAGNOSIS — N2581 Secondary hyperparathyroidism of renal origin: Secondary | ICD-10-CM | POA: Diagnosis not present

## 2014-06-24 DIAGNOSIS — D509 Iron deficiency anemia, unspecified: Secondary | ICD-10-CM | POA: Diagnosis not present

## 2014-06-25 DIAGNOSIS — Z79891 Long term (current) use of opiate analgesic: Secondary | ICD-10-CM | POA: Diagnosis not present

## 2014-06-25 DIAGNOSIS — M87022 Idiopathic aseptic necrosis of left humerus: Secondary | ICD-10-CM | POA: Diagnosis not present

## 2014-06-25 DIAGNOSIS — M87059 Idiopathic aseptic necrosis of unspecified femur: Secondary | ICD-10-CM | POA: Diagnosis not present

## 2014-06-25 DIAGNOSIS — M87021 Idiopathic aseptic necrosis of right humerus: Secondary | ICD-10-CM | POA: Diagnosis not present

## 2014-06-26 DIAGNOSIS — N186 End stage renal disease: Secondary | ICD-10-CM | POA: Diagnosis not present

## 2014-06-26 DIAGNOSIS — D509 Iron deficiency anemia, unspecified: Secondary | ICD-10-CM | POA: Diagnosis not present

## 2014-06-26 DIAGNOSIS — D631 Anemia in chronic kidney disease: Secondary | ICD-10-CM | POA: Diagnosis not present

## 2014-06-26 DIAGNOSIS — N2581 Secondary hyperparathyroidism of renal origin: Secondary | ICD-10-CM | POA: Diagnosis not present

## 2014-06-29 DIAGNOSIS — N2581 Secondary hyperparathyroidism of renal origin: Secondary | ICD-10-CM | POA: Diagnosis not present

## 2014-06-29 DIAGNOSIS — D509 Iron deficiency anemia, unspecified: Secondary | ICD-10-CM | POA: Diagnosis not present

## 2014-06-29 DIAGNOSIS — N186 End stage renal disease: Secondary | ICD-10-CM | POA: Diagnosis not present

## 2014-06-29 DIAGNOSIS — D631 Anemia in chronic kidney disease: Secondary | ICD-10-CM | POA: Diagnosis not present

## 2014-07-03 DIAGNOSIS — D631 Anemia in chronic kidney disease: Secondary | ICD-10-CM | POA: Diagnosis not present

## 2014-07-03 DIAGNOSIS — N186 End stage renal disease: Secondary | ICD-10-CM | POA: Diagnosis not present

## 2014-07-03 DIAGNOSIS — D509 Iron deficiency anemia, unspecified: Secondary | ICD-10-CM | POA: Diagnosis not present

## 2014-07-03 DIAGNOSIS — N2581 Secondary hyperparathyroidism of renal origin: Secondary | ICD-10-CM | POA: Diagnosis not present

## 2014-07-04 DIAGNOSIS — N186 End stage renal disease: Secondary | ICD-10-CM | POA: Diagnosis not present

## 2014-07-04 DIAGNOSIS — Z992 Dependence on renal dialysis: Secondary | ICD-10-CM | POA: Diagnosis not present

## 2014-07-05 DIAGNOSIS — N186 End stage renal disease: Secondary | ICD-10-CM | POA: Diagnosis not present

## 2014-07-05 DIAGNOSIS — E875 Hyperkalemia: Secondary | ICD-10-CM | POA: Diagnosis not present

## 2014-07-05 DIAGNOSIS — D509 Iron deficiency anemia, unspecified: Secondary | ICD-10-CM | POA: Diagnosis not present

## 2014-07-05 DIAGNOSIS — N2581 Secondary hyperparathyroidism of renal origin: Secondary | ICD-10-CM | POA: Diagnosis not present

## 2014-07-05 DIAGNOSIS — D631 Anemia in chronic kidney disease: Secondary | ICD-10-CM | POA: Diagnosis not present

## 2014-07-18 DIAGNOSIS — Z01818 Encounter for other preprocedural examination: Secondary | ICD-10-CM | POA: Diagnosis not present

## 2014-07-18 DIAGNOSIS — Z79899 Other long term (current) drug therapy: Secondary | ICD-10-CM | POA: Diagnosis not present

## 2014-07-18 DIAGNOSIS — Z992 Dependence on renal dialysis: Secondary | ICD-10-CM | POA: Diagnosis not present

## 2014-07-18 DIAGNOSIS — Z7682 Awaiting organ transplant status: Secondary | ICD-10-CM | POA: Diagnosis not present

## 2014-07-18 DIAGNOSIS — I120 Hypertensive chronic kidney disease with stage 5 chronic kidney disease or end stage renal disease: Secondary | ICD-10-CM | POA: Diagnosis not present

## 2014-07-18 DIAGNOSIS — N186 End stage renal disease: Secondary | ICD-10-CM | POA: Diagnosis not present

## 2014-07-18 DIAGNOSIS — Z01810 Encounter for preprocedural cardiovascular examination: Secondary | ICD-10-CM | POA: Diagnosis not present

## 2014-07-18 DIAGNOSIS — Z125 Encounter for screening for malignant neoplasm of prostate: Secondary | ICD-10-CM | POA: Diagnosis not present

## 2014-07-18 DIAGNOSIS — N029 Recurrent and persistent hematuria with unspecified morphologic changes: Secondary | ICD-10-CM | POA: Diagnosis not present

## 2014-08-04 DIAGNOSIS — Z992 Dependence on renal dialysis: Secondary | ICD-10-CM | POA: Diagnosis not present

## 2014-08-04 DIAGNOSIS — N186 End stage renal disease: Secondary | ICD-10-CM | POA: Diagnosis not present

## 2014-08-05 DIAGNOSIS — N2581 Secondary hyperparathyroidism of renal origin: Secondary | ICD-10-CM | POA: Diagnosis not present

## 2014-08-05 DIAGNOSIS — N186 End stage renal disease: Secondary | ICD-10-CM | POA: Diagnosis not present

## 2014-09-02 DIAGNOSIS — Z992 Dependence on renal dialysis: Secondary | ICD-10-CM | POA: Diagnosis not present

## 2014-09-02 DIAGNOSIS — N186 End stage renal disease: Secondary | ICD-10-CM | POA: Diagnosis not present

## 2014-09-04 DIAGNOSIS — N2581 Secondary hyperparathyroidism of renal origin: Secondary | ICD-10-CM | POA: Diagnosis not present

## 2014-09-04 DIAGNOSIS — N186 End stage renal disease: Secondary | ICD-10-CM | POA: Diagnosis not present

## 2014-09-04 DIAGNOSIS — D631 Anemia in chronic kidney disease: Secondary | ICD-10-CM | POA: Diagnosis not present

## 2014-09-06 DIAGNOSIS — D631 Anemia in chronic kidney disease: Secondary | ICD-10-CM | POA: Diagnosis not present

## 2014-09-06 DIAGNOSIS — N2581 Secondary hyperparathyroidism of renal origin: Secondary | ICD-10-CM | POA: Diagnosis not present

## 2014-09-06 DIAGNOSIS — N186 End stage renal disease: Secondary | ICD-10-CM | POA: Diagnosis not present

## 2014-09-09 DIAGNOSIS — D631 Anemia in chronic kidney disease: Secondary | ICD-10-CM | POA: Diagnosis not present

## 2014-09-09 DIAGNOSIS — N2581 Secondary hyperparathyroidism of renal origin: Secondary | ICD-10-CM | POA: Diagnosis not present

## 2014-09-09 DIAGNOSIS — N186 End stage renal disease: Secondary | ICD-10-CM | POA: Diagnosis not present

## 2014-09-11 DIAGNOSIS — D631 Anemia in chronic kidney disease: Secondary | ICD-10-CM | POA: Diagnosis not present

## 2014-09-11 DIAGNOSIS — N186 End stage renal disease: Secondary | ICD-10-CM | POA: Diagnosis not present

## 2014-09-11 DIAGNOSIS — N2581 Secondary hyperparathyroidism of renal origin: Secondary | ICD-10-CM | POA: Diagnosis not present

## 2014-09-13 DIAGNOSIS — N2581 Secondary hyperparathyroidism of renal origin: Secondary | ICD-10-CM | POA: Diagnosis not present

## 2014-09-13 DIAGNOSIS — N186 End stage renal disease: Secondary | ICD-10-CM | POA: Diagnosis not present

## 2014-09-13 DIAGNOSIS — D631 Anemia in chronic kidney disease: Secondary | ICD-10-CM | POA: Diagnosis not present

## 2014-09-16 DIAGNOSIS — N2581 Secondary hyperparathyroidism of renal origin: Secondary | ICD-10-CM | POA: Diagnosis not present

## 2014-09-16 DIAGNOSIS — N186 End stage renal disease: Secondary | ICD-10-CM | POA: Diagnosis not present

## 2014-09-16 DIAGNOSIS — D631 Anemia in chronic kidney disease: Secondary | ICD-10-CM | POA: Diagnosis not present

## 2014-09-18 DIAGNOSIS — N2581 Secondary hyperparathyroidism of renal origin: Secondary | ICD-10-CM | POA: Diagnosis not present

## 2014-09-18 DIAGNOSIS — N186 End stage renal disease: Secondary | ICD-10-CM | POA: Diagnosis not present

## 2014-09-18 DIAGNOSIS — D631 Anemia in chronic kidney disease: Secondary | ICD-10-CM | POA: Diagnosis not present

## 2014-09-20 DIAGNOSIS — N2581 Secondary hyperparathyroidism of renal origin: Secondary | ICD-10-CM | POA: Diagnosis not present

## 2014-09-20 DIAGNOSIS — D631 Anemia in chronic kidney disease: Secondary | ICD-10-CM | POA: Diagnosis not present

## 2014-09-20 DIAGNOSIS — N186 End stage renal disease: Secondary | ICD-10-CM | POA: Diagnosis not present

## 2014-09-23 DIAGNOSIS — N2581 Secondary hyperparathyroidism of renal origin: Secondary | ICD-10-CM | POA: Diagnosis not present

## 2014-09-23 DIAGNOSIS — N186 End stage renal disease: Secondary | ICD-10-CM | POA: Diagnosis not present

## 2014-09-23 DIAGNOSIS — D631 Anemia in chronic kidney disease: Secondary | ICD-10-CM | POA: Diagnosis not present

## 2014-09-25 DIAGNOSIS — N2581 Secondary hyperparathyroidism of renal origin: Secondary | ICD-10-CM | POA: Diagnosis not present

## 2014-09-25 DIAGNOSIS — D631 Anemia in chronic kidney disease: Secondary | ICD-10-CM | POA: Diagnosis not present

## 2014-09-25 DIAGNOSIS — N186 End stage renal disease: Secondary | ICD-10-CM | POA: Diagnosis not present

## 2014-09-27 DIAGNOSIS — N2581 Secondary hyperparathyroidism of renal origin: Secondary | ICD-10-CM | POA: Diagnosis not present

## 2014-09-27 DIAGNOSIS — N186 End stage renal disease: Secondary | ICD-10-CM | POA: Diagnosis not present

## 2014-09-27 DIAGNOSIS — D631 Anemia in chronic kidney disease: Secondary | ICD-10-CM | POA: Diagnosis not present

## 2014-09-30 DIAGNOSIS — N2581 Secondary hyperparathyroidism of renal origin: Secondary | ICD-10-CM | POA: Diagnosis not present

## 2014-09-30 DIAGNOSIS — D631 Anemia in chronic kidney disease: Secondary | ICD-10-CM | POA: Diagnosis not present

## 2014-09-30 DIAGNOSIS — N186 End stage renal disease: Secondary | ICD-10-CM | POA: Diagnosis not present

## 2014-10-03 DIAGNOSIS — N08 Glomerular disorders in diseases classified elsewhere: Secondary | ICD-10-CM | POA: Diagnosis not present

## 2014-10-03 DIAGNOSIS — Z992 Dependence on renal dialysis: Secondary | ICD-10-CM | POA: Diagnosis not present

## 2014-10-03 DIAGNOSIS — N186 End stage renal disease: Secondary | ICD-10-CM | POA: Diagnosis not present

## 2014-10-04 DIAGNOSIS — D631 Anemia in chronic kidney disease: Secondary | ICD-10-CM | POA: Diagnosis not present

## 2014-10-04 DIAGNOSIS — Z23 Encounter for immunization: Secondary | ICD-10-CM | POA: Diagnosis not present

## 2014-10-04 DIAGNOSIS — N186 End stage renal disease: Secondary | ICD-10-CM | POA: Diagnosis not present

## 2014-10-04 DIAGNOSIS — N2581 Secondary hyperparathyroidism of renal origin: Secondary | ICD-10-CM | POA: Diagnosis not present

## 2014-10-04 DIAGNOSIS — R5081 Fever presenting with conditions classified elsewhere: Secondary | ICD-10-CM | POA: Diagnosis not present

## 2014-10-07 DIAGNOSIS — J209 Acute bronchitis, unspecified: Secondary | ICD-10-CM | POA: Diagnosis not present

## 2014-10-07 DIAGNOSIS — N186 End stage renal disease: Secondary | ICD-10-CM | POA: Diagnosis not present

## 2014-10-07 DIAGNOSIS — Z992 Dependence on renal dialysis: Secondary | ICD-10-CM | POA: Diagnosis not present

## 2014-10-07 DIAGNOSIS — J219 Acute bronchiolitis, unspecified: Secondary | ICD-10-CM | POA: Diagnosis not present

## 2014-11-02 DIAGNOSIS — N08 Glomerular disorders in diseases classified elsewhere: Secondary | ICD-10-CM | POA: Diagnosis not present

## 2014-11-02 DIAGNOSIS — Z992 Dependence on renal dialysis: Secondary | ICD-10-CM | POA: Diagnosis not present

## 2014-11-02 DIAGNOSIS — N186 End stage renal disease: Secondary | ICD-10-CM | POA: Diagnosis not present

## 2014-11-04 DIAGNOSIS — N2581 Secondary hyperparathyroidism of renal origin: Secondary | ICD-10-CM | POA: Diagnosis not present

## 2014-11-04 DIAGNOSIS — N186 End stage renal disease: Secondary | ICD-10-CM | POA: Diagnosis not present

## 2014-11-04 DIAGNOSIS — T82590A Other mechanical complication of surgically created arteriovenous fistula, initial encounter: Secondary | ICD-10-CM | POA: Diagnosis not present

## 2014-11-06 ENCOUNTER — Encounter: Payer: Self-pay | Admitting: Vascular Surgery

## 2014-11-06 DIAGNOSIS — T82590A Other mechanical complication of surgically created arteriovenous fistula, initial encounter: Secondary | ICD-10-CM | POA: Diagnosis not present

## 2014-11-06 DIAGNOSIS — N186 End stage renal disease: Secondary | ICD-10-CM | POA: Diagnosis not present

## 2014-11-06 DIAGNOSIS — N2581 Secondary hyperparathyroidism of renal origin: Secondary | ICD-10-CM | POA: Diagnosis not present

## 2014-11-07 ENCOUNTER — Encounter: Payer: Self-pay | Admitting: Vascular Surgery

## 2014-11-07 ENCOUNTER — Ambulatory Visit (INDEPENDENT_AMBULATORY_CARE_PROVIDER_SITE_OTHER): Payer: Medicare Other | Admitting: Vascular Surgery

## 2014-11-07 VITALS — BP 140/69 | HR 101 | Ht 75.0 in | Wt 140.9 lb

## 2014-11-07 DIAGNOSIS — T82898A Other specified complication of vascular prosthetic devices, implants and grafts, initial encounter: Secondary | ICD-10-CM

## 2014-11-07 DIAGNOSIS — T82510A Breakdown (mechanical) of surgically created arteriovenous fistula, initial encounter: Secondary | ICD-10-CM

## 2014-11-07 NOTE — Progress Notes (Signed)
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 I performed a plication of this fistula and May 2014. The patient previously had a repair of a segment of his fistula by Dr. Kellie Simmering in 2013.Marland Kitchen He has had no bleeding episodes. The fistula has been in place since 2010. He currently dialyzes in [facility] Monday Wednesday Friday.  Review of systems: He denies shortness of breath. He denies chest pain.  Past Surgical History  Procedure Laterality Date  . Av fistula placement    . Diatek placed and removed      UNTIL FISTULA MATURED  . Thrombectomy w/ embolectomy  07/08/2011    Procedure: THROMBECTOMY ARTERIOVENOUS FISTULA;  Surgeon: Mal Misty, MD;  Location: [facility];  Service: Vascular;  Laterality: Left;  Incision of Ulcerated Skin and Revision of Left Upper Arm Fistula  . Revison of arteriovenous fistula Left 11/28/2012    Procedure: REVISON OF ARTERIOVENOUS FISTULA;  Surgeon: Elam Dutch, MD;  Location: [facility] OR;  Service: Vascular;  Laterality: Left;  Plication      Physical exam:    Filed Vitals:   11/07/14 0936  BP: 140/69  Pulse: 101  Height: 6\' 3"  (1.905 m)  Weight: 140 lb 14.4 oz (63.912 kg)  SpO2: 100%   Left upper extremity. He has multiple areas in the proximal third to midportion of the fistula of small pseudoaneurysms. There is an area of blistered skin which is not tender in the left upper third of the arm which is 2-3 mm diameter. It is not ulcerated. There is a palpable 2+ left radial pulse.   Assessment: Small Pseudoaneurysmal left distal third of fistula 2 mm diameter without then scanned low risk for bleeding if this side is avoided with catheters.  Plan: Observation for now. Avoid cannulating in the upper portion of the fistula where this blistered area occurs. Follow-up if continued enlargement of the fistula or in the segment that he presented with today.  Ruta Hinds, MD [facility] [place] Office: [phone] Pager: [phone]

## 2014-11-08 DIAGNOSIS — T82590A Other mechanical complication of surgically created arteriovenous fistula, initial encounter: Secondary | ICD-10-CM | POA: Diagnosis not present

## 2014-11-08 DIAGNOSIS — N186 End stage renal disease: Secondary | ICD-10-CM | POA: Diagnosis not present

## 2014-11-08 DIAGNOSIS — N2581 Secondary hyperparathyroidism of renal origin: Secondary | ICD-10-CM | POA: Diagnosis not present

## 2014-11-11 DIAGNOSIS — T82590A Other mechanical complication of surgically created arteriovenous fistula, initial encounter: Secondary | ICD-10-CM | POA: Diagnosis not present

## 2014-11-11 DIAGNOSIS — N2581 Secondary hyperparathyroidism of renal origin: Secondary | ICD-10-CM | POA: Diagnosis not present

## 2014-11-11 DIAGNOSIS — N186 End stage renal disease: Secondary | ICD-10-CM | POA: Diagnosis not present

## 2014-11-13 DIAGNOSIS — N2581 Secondary hyperparathyroidism of renal origin: Secondary | ICD-10-CM | POA: Diagnosis not present

## 2014-11-13 DIAGNOSIS — T82590A Other mechanical complication of surgically created arteriovenous fistula, initial encounter: Secondary | ICD-10-CM | POA: Diagnosis not present

## 2014-11-13 DIAGNOSIS — N186 End stage renal disease: Secondary | ICD-10-CM | POA: Diagnosis not present

## 2014-11-15 DIAGNOSIS — N186 End stage renal disease: Secondary | ICD-10-CM | POA: Diagnosis not present

## 2014-11-15 DIAGNOSIS — T82590A Other mechanical complication of surgically created arteriovenous fistula, initial encounter: Secondary | ICD-10-CM | POA: Diagnosis not present

## 2014-11-15 DIAGNOSIS — N2581 Secondary hyperparathyroidism of renal origin: Secondary | ICD-10-CM | POA: Diagnosis not present

## 2014-11-18 DIAGNOSIS — T82590A Other mechanical complication of surgically created arteriovenous fistula, initial encounter: Secondary | ICD-10-CM | POA: Diagnosis not present

## 2014-11-18 DIAGNOSIS — N2581 Secondary hyperparathyroidism of renal origin: Secondary | ICD-10-CM | POA: Diagnosis not present

## 2014-11-18 DIAGNOSIS — N186 End stage renal disease: Secondary | ICD-10-CM | POA: Diagnosis not present

## 2014-11-20 DIAGNOSIS — N186 End stage renal disease: Secondary | ICD-10-CM | POA: Diagnosis not present

## 2014-11-20 DIAGNOSIS — N2581 Secondary hyperparathyroidism of renal origin: Secondary | ICD-10-CM | POA: Diagnosis not present

## 2014-11-20 DIAGNOSIS — T82590A Other mechanical complication of surgically created arteriovenous fistula, initial encounter: Secondary | ICD-10-CM | POA: Diagnosis not present

## 2014-11-22 DIAGNOSIS — N2581 Secondary hyperparathyroidism of renal origin: Secondary | ICD-10-CM | POA: Diagnosis not present

## 2014-11-22 DIAGNOSIS — T82590A Other mechanical complication of surgically created arteriovenous fistula, initial encounter: Secondary | ICD-10-CM | POA: Diagnosis not present

## 2014-11-22 DIAGNOSIS — N186 End stage renal disease: Secondary | ICD-10-CM | POA: Diagnosis not present

## 2014-11-25 DIAGNOSIS — T82590A Other mechanical complication of surgically created arteriovenous fistula, initial encounter: Secondary | ICD-10-CM | POA: Diagnosis not present

## 2014-11-25 DIAGNOSIS — N186 End stage renal disease: Secondary | ICD-10-CM | POA: Diagnosis not present

## 2014-11-25 DIAGNOSIS — N2581 Secondary hyperparathyroidism of renal origin: Secondary | ICD-10-CM | POA: Diagnosis not present

## 2014-11-27 DIAGNOSIS — N186 End stage renal disease: Secondary | ICD-10-CM | POA: Diagnosis not present

## 2014-11-27 DIAGNOSIS — T82590A Other mechanical complication of surgically created arteriovenous fistula, initial encounter: Secondary | ICD-10-CM | POA: Diagnosis not present

## 2014-11-27 DIAGNOSIS — N2581 Secondary hyperparathyroidism of renal origin: Secondary | ICD-10-CM | POA: Diagnosis not present

## 2014-11-29 DIAGNOSIS — T82590A Other mechanical complication of surgically created arteriovenous fistula, initial encounter: Secondary | ICD-10-CM | POA: Diagnosis not present

## 2014-11-29 DIAGNOSIS — N186 End stage renal disease: Secondary | ICD-10-CM | POA: Diagnosis not present

## 2014-11-29 DIAGNOSIS — N2581 Secondary hyperparathyroidism of renal origin: Secondary | ICD-10-CM | POA: Diagnosis not present

## 2014-12-03 DIAGNOSIS — N2581 Secondary hyperparathyroidism of renal origin: Secondary | ICD-10-CM | POA: Diagnosis not present

## 2014-12-03 DIAGNOSIS — T82590A Other mechanical complication of surgically created arteriovenous fistula, initial encounter: Secondary | ICD-10-CM | POA: Diagnosis not present

## 2014-12-03 DIAGNOSIS — N186 End stage renal disease: Secondary | ICD-10-CM | POA: Diagnosis not present

## 2014-12-03 DIAGNOSIS — N08 Glomerular disorders in diseases classified elsewhere: Secondary | ICD-10-CM | POA: Diagnosis not present

## 2014-12-03 DIAGNOSIS — Z992 Dependence on renal dialysis: Secondary | ICD-10-CM | POA: Diagnosis not present

## 2014-12-04 DIAGNOSIS — N186 End stage renal disease: Secondary | ICD-10-CM | POA: Diagnosis not present

## 2014-12-06 DIAGNOSIS — N186 End stage renal disease: Secondary | ICD-10-CM | POA: Diagnosis not present

## 2014-12-09 DIAGNOSIS — N186 End stage renal disease: Secondary | ICD-10-CM | POA: Diagnosis not present

## 2014-12-11 DIAGNOSIS — N186 End stage renal disease: Secondary | ICD-10-CM | POA: Diagnosis not present

## 2014-12-13 DIAGNOSIS — N186 End stage renal disease: Secondary | ICD-10-CM | POA: Diagnosis not present

## 2014-12-16 DIAGNOSIS — K7689 Other specified diseases of liver: Secondary | ICD-10-CM | POA: Diagnosis not present

## 2014-12-16 DIAGNOSIS — D631 Anemia in chronic kidney disease: Secondary | ICD-10-CM | POA: Diagnosis not present

## 2014-12-16 DIAGNOSIS — N186 End stage renal disease: Secondary | ICD-10-CM | POA: Diagnosis not present

## 2014-12-16 DIAGNOSIS — Z4931 Encounter for adequacy testing for hemodialysis: Secondary | ICD-10-CM | POA: Diagnosis not present

## 2014-12-16 DIAGNOSIS — R8299 Other abnormal findings in urine: Secondary | ICD-10-CM | POA: Diagnosis not present

## 2014-12-16 DIAGNOSIS — D513 Other dietary vitamin B12 deficiency anemia: Secondary | ICD-10-CM | POA: Diagnosis not present

## 2014-12-16 DIAGNOSIS — D630 Anemia in neoplastic disease: Secondary | ICD-10-CM | POA: Diagnosis not present

## 2014-12-16 DIAGNOSIS — N2581 Secondary hyperparathyroidism of renal origin: Secondary | ICD-10-CM | POA: Diagnosis not present

## 2014-12-16 DIAGNOSIS — D509 Iron deficiency anemia, unspecified: Secondary | ICD-10-CM | POA: Diagnosis not present

## 2014-12-17 DIAGNOSIS — D630 Anemia in neoplastic disease: Secondary | ICD-10-CM | POA: Diagnosis not present

## 2014-12-17 DIAGNOSIS — N2581 Secondary hyperparathyroidism of renal origin: Secondary | ICD-10-CM | POA: Diagnosis not present

## 2014-12-17 DIAGNOSIS — D631 Anemia in chronic kidney disease: Secondary | ICD-10-CM | POA: Diagnosis not present

## 2014-12-17 DIAGNOSIS — N186 End stage renal disease: Secondary | ICD-10-CM | POA: Diagnosis not present

## 2014-12-17 DIAGNOSIS — D509 Iron deficiency anemia, unspecified: Secondary | ICD-10-CM | POA: Diagnosis not present

## 2014-12-17 DIAGNOSIS — K7689 Other specified diseases of liver: Secondary | ICD-10-CM | POA: Diagnosis not present

## 2014-12-18 DIAGNOSIS — D631 Anemia in chronic kidney disease: Secondary | ICD-10-CM | POA: Diagnosis not present

## 2014-12-18 DIAGNOSIS — K7689 Other specified diseases of liver: Secondary | ICD-10-CM | POA: Diagnosis not present

## 2014-12-18 DIAGNOSIS — D509 Iron deficiency anemia, unspecified: Secondary | ICD-10-CM | POA: Diagnosis not present

## 2014-12-18 DIAGNOSIS — N2581 Secondary hyperparathyroidism of renal origin: Secondary | ICD-10-CM | POA: Diagnosis not present

## 2014-12-18 DIAGNOSIS — D630 Anemia in neoplastic disease: Secondary | ICD-10-CM | POA: Diagnosis not present

## 2014-12-18 DIAGNOSIS — N186 End stage renal disease: Secondary | ICD-10-CM | POA: Diagnosis not present

## 2014-12-19 DIAGNOSIS — N2581 Secondary hyperparathyroidism of renal origin: Secondary | ICD-10-CM | POA: Diagnosis not present

## 2014-12-19 DIAGNOSIS — D631 Anemia in chronic kidney disease: Secondary | ICD-10-CM | POA: Diagnosis not present

## 2014-12-19 DIAGNOSIS — K7689 Other specified diseases of liver: Secondary | ICD-10-CM | POA: Diagnosis not present

## 2014-12-19 DIAGNOSIS — D630 Anemia in neoplastic disease: Secondary | ICD-10-CM | POA: Diagnosis not present

## 2014-12-19 DIAGNOSIS — D509 Iron deficiency anemia, unspecified: Secondary | ICD-10-CM | POA: Diagnosis not present

## 2014-12-19 DIAGNOSIS — N186 End stage renal disease: Secondary | ICD-10-CM | POA: Diagnosis not present

## 2014-12-20 DIAGNOSIS — D630 Anemia in neoplastic disease: Secondary | ICD-10-CM | POA: Diagnosis not present

## 2014-12-20 DIAGNOSIS — D631 Anemia in chronic kidney disease: Secondary | ICD-10-CM | POA: Diagnosis not present

## 2014-12-20 DIAGNOSIS — D509 Iron deficiency anemia, unspecified: Secondary | ICD-10-CM | POA: Diagnosis not present

## 2014-12-20 DIAGNOSIS — N186 End stage renal disease: Secondary | ICD-10-CM | POA: Diagnosis not present

## 2014-12-20 DIAGNOSIS — N2581 Secondary hyperparathyroidism of renal origin: Secondary | ICD-10-CM | POA: Diagnosis not present

## 2014-12-20 DIAGNOSIS — K7689 Other specified diseases of liver: Secondary | ICD-10-CM | POA: Diagnosis not present

## 2014-12-23 DIAGNOSIS — N2581 Secondary hyperparathyroidism of renal origin: Secondary | ICD-10-CM | POA: Diagnosis not present

## 2014-12-23 DIAGNOSIS — D631 Anemia in chronic kidney disease: Secondary | ICD-10-CM | POA: Diagnosis not present

## 2014-12-23 DIAGNOSIS — D630 Anemia in neoplastic disease: Secondary | ICD-10-CM | POA: Diagnosis not present

## 2014-12-23 DIAGNOSIS — D509 Iron deficiency anemia, unspecified: Secondary | ICD-10-CM | POA: Diagnosis not present

## 2014-12-23 DIAGNOSIS — N186 End stage renal disease: Secondary | ICD-10-CM | POA: Diagnosis not present

## 2014-12-23 DIAGNOSIS — K7689 Other specified diseases of liver: Secondary | ICD-10-CM | POA: Diagnosis not present

## 2014-12-24 DIAGNOSIS — N2581 Secondary hyperparathyroidism of renal origin: Secondary | ICD-10-CM | POA: Diagnosis not present

## 2014-12-24 DIAGNOSIS — N186 End stage renal disease: Secondary | ICD-10-CM | POA: Diagnosis not present

## 2014-12-24 DIAGNOSIS — D509 Iron deficiency anemia, unspecified: Secondary | ICD-10-CM | POA: Diagnosis not present

## 2014-12-24 DIAGNOSIS — D631 Anemia in chronic kidney disease: Secondary | ICD-10-CM | POA: Diagnosis not present

## 2014-12-24 DIAGNOSIS — D630 Anemia in neoplastic disease: Secondary | ICD-10-CM | POA: Diagnosis not present

## 2014-12-24 DIAGNOSIS — K7689 Other specified diseases of liver: Secondary | ICD-10-CM | POA: Diagnosis not present

## 2014-12-25 DIAGNOSIS — D509 Iron deficiency anemia, unspecified: Secondary | ICD-10-CM | POA: Diagnosis not present

## 2014-12-25 DIAGNOSIS — N2581 Secondary hyperparathyroidism of renal origin: Secondary | ICD-10-CM | POA: Diagnosis not present

## 2014-12-25 DIAGNOSIS — I871 Compression of vein: Secondary | ICD-10-CM | POA: Diagnosis not present

## 2014-12-25 DIAGNOSIS — D631 Anemia in chronic kidney disease: Secondary | ICD-10-CM | POA: Diagnosis not present

## 2014-12-25 DIAGNOSIS — T82858A Stenosis of vascular prosthetic devices, implants and grafts, initial encounter: Secondary | ICD-10-CM | POA: Diagnosis not present

## 2014-12-25 DIAGNOSIS — K7689 Other specified diseases of liver: Secondary | ICD-10-CM | POA: Diagnosis not present

## 2014-12-25 DIAGNOSIS — Z992 Dependence on renal dialysis: Secondary | ICD-10-CM | POA: Diagnosis not present

## 2014-12-25 DIAGNOSIS — D630 Anemia in neoplastic disease: Secondary | ICD-10-CM | POA: Diagnosis not present

## 2014-12-25 DIAGNOSIS — N186 End stage renal disease: Secondary | ICD-10-CM | POA: Diagnosis not present

## 2014-12-26 DIAGNOSIS — D509 Iron deficiency anemia, unspecified: Secondary | ICD-10-CM | POA: Diagnosis not present

## 2014-12-26 DIAGNOSIS — N186 End stage renal disease: Secondary | ICD-10-CM | POA: Diagnosis not present

## 2014-12-26 DIAGNOSIS — D631 Anemia in chronic kidney disease: Secondary | ICD-10-CM | POA: Diagnosis not present

## 2014-12-26 DIAGNOSIS — D630 Anemia in neoplastic disease: Secondary | ICD-10-CM | POA: Diagnosis not present

## 2014-12-26 DIAGNOSIS — K7689 Other specified diseases of liver: Secondary | ICD-10-CM | POA: Diagnosis not present

## 2014-12-26 DIAGNOSIS — N2581 Secondary hyperparathyroidism of renal origin: Secondary | ICD-10-CM | POA: Diagnosis not present

## 2014-12-27 DIAGNOSIS — R531 Weakness: Secondary | ICD-10-CM | POA: Diagnosis not present

## 2014-12-27 DIAGNOSIS — R42 Dizziness and giddiness: Secondary | ICD-10-CM | POA: Diagnosis not present

## 2014-12-27 DIAGNOSIS — R000 Tachycardia, unspecified: Secondary | ICD-10-CM | POA: Diagnosis not present

## 2014-12-27 DIAGNOSIS — R404 Transient alteration of awareness: Secondary | ICD-10-CM | POA: Diagnosis not present

## 2014-12-30 DIAGNOSIS — D630 Anemia in neoplastic disease: Secondary | ICD-10-CM | POA: Diagnosis not present

## 2014-12-30 DIAGNOSIS — D509 Iron deficiency anemia, unspecified: Secondary | ICD-10-CM | POA: Diagnosis not present

## 2014-12-30 DIAGNOSIS — K7689 Other specified diseases of liver: Secondary | ICD-10-CM | POA: Diagnosis not present

## 2014-12-30 DIAGNOSIS — D631 Anemia in chronic kidney disease: Secondary | ICD-10-CM | POA: Diagnosis not present

## 2014-12-30 DIAGNOSIS — N186 End stage renal disease: Secondary | ICD-10-CM | POA: Diagnosis not present

## 2014-12-30 DIAGNOSIS — N2581 Secondary hyperparathyroidism of renal origin: Secondary | ICD-10-CM | POA: Diagnosis not present

## 2014-12-31 DIAGNOSIS — K7689 Other specified diseases of liver: Secondary | ICD-10-CM | POA: Diagnosis not present

## 2014-12-31 DIAGNOSIS — N186 End stage renal disease: Secondary | ICD-10-CM | POA: Diagnosis not present

## 2014-12-31 DIAGNOSIS — N2581 Secondary hyperparathyroidism of renal origin: Secondary | ICD-10-CM | POA: Diagnosis not present

## 2014-12-31 DIAGNOSIS — D509 Iron deficiency anemia, unspecified: Secondary | ICD-10-CM | POA: Diagnosis not present

## 2014-12-31 DIAGNOSIS — D631 Anemia in chronic kidney disease: Secondary | ICD-10-CM | POA: Diagnosis not present

## 2014-12-31 DIAGNOSIS — D630 Anemia in neoplastic disease: Secondary | ICD-10-CM | POA: Diagnosis not present

## 2015-01-01 DIAGNOSIS — D630 Anemia in neoplastic disease: Secondary | ICD-10-CM | POA: Diagnosis not present

## 2015-01-01 DIAGNOSIS — D509 Iron deficiency anemia, unspecified: Secondary | ICD-10-CM | POA: Diagnosis not present

## 2015-01-01 DIAGNOSIS — N2581 Secondary hyperparathyroidism of renal origin: Secondary | ICD-10-CM | POA: Diagnosis not present

## 2015-01-01 DIAGNOSIS — N186 End stage renal disease: Secondary | ICD-10-CM | POA: Diagnosis not present

## 2015-01-01 DIAGNOSIS — D631 Anemia in chronic kidney disease: Secondary | ICD-10-CM | POA: Diagnosis not present

## 2015-01-01 DIAGNOSIS — K7689 Other specified diseases of liver: Secondary | ICD-10-CM | POA: Diagnosis not present

## 2015-01-02 DIAGNOSIS — N200 Calculus of kidney: Secondary | ICD-10-CM | POA: Diagnosis not present

## 2015-01-02 DIAGNOSIS — N08 Glomerular disorders in diseases classified elsewhere: Secondary | ICD-10-CM | POA: Diagnosis not present

## 2015-01-02 DIAGNOSIS — N186 End stage renal disease: Secondary | ICD-10-CM | POA: Diagnosis not present

## 2015-01-02 DIAGNOSIS — Z79899 Other long term (current) drug therapy: Secondary | ICD-10-CM | POA: Diagnosis not present

## 2015-01-02 DIAGNOSIS — Z992 Dependence on renal dialysis: Secondary | ICD-10-CM | POA: Diagnosis not present

## 2015-01-02 DIAGNOSIS — N281 Cyst of kidney, acquired: Secondary | ICD-10-CM | POA: Diagnosis not present

## 2015-01-03 DIAGNOSIS — D509 Iron deficiency anemia, unspecified: Secondary | ICD-10-CM | POA: Diagnosis not present

## 2015-01-03 DIAGNOSIS — D631 Anemia in chronic kidney disease: Secondary | ICD-10-CM | POA: Diagnosis not present

## 2015-01-03 DIAGNOSIS — N2589 Other disorders resulting from impaired renal tubular function: Secondary | ICD-10-CM | POA: Diagnosis not present

## 2015-01-03 DIAGNOSIS — D630 Anemia in neoplastic disease: Secondary | ICD-10-CM | POA: Diagnosis not present

## 2015-01-03 DIAGNOSIS — N2581 Secondary hyperparathyroidism of renal origin: Secondary | ICD-10-CM | POA: Diagnosis not present

## 2015-01-03 DIAGNOSIS — Z79899 Other long term (current) drug therapy: Secondary | ICD-10-CM | POA: Diagnosis not present

## 2015-01-03 DIAGNOSIS — E440 Moderate protein-calorie malnutrition: Secondary | ICD-10-CM | POA: Diagnosis not present

## 2015-01-03 DIAGNOSIS — E878 Other disorders of electrolyte and fluid balance, not elsewhere classified: Secondary | ICD-10-CM | POA: Diagnosis not present

## 2015-01-03 DIAGNOSIS — Z4931 Encounter for adequacy testing for hemodialysis: Secondary | ICD-10-CM | POA: Diagnosis not present

## 2015-01-03 DIAGNOSIS — N186 End stage renal disease: Secondary | ICD-10-CM | POA: Diagnosis not present

## 2015-01-06 DIAGNOSIS — D509 Iron deficiency anemia, unspecified: Secondary | ICD-10-CM | POA: Diagnosis not present

## 2015-01-06 DIAGNOSIS — N2589 Other disorders resulting from impaired renal tubular function: Secondary | ICD-10-CM | POA: Diagnosis not present

## 2015-01-06 DIAGNOSIS — Z79899 Other long term (current) drug therapy: Secondary | ICD-10-CM | POA: Diagnosis not present

## 2015-01-06 DIAGNOSIS — D630 Anemia in neoplastic disease: Secondary | ICD-10-CM | POA: Diagnosis not present

## 2015-01-06 DIAGNOSIS — N186 End stage renal disease: Secondary | ICD-10-CM | POA: Diagnosis not present

## 2015-01-06 DIAGNOSIS — E440 Moderate protein-calorie malnutrition: Secondary | ICD-10-CM | POA: Diagnosis not present

## 2015-01-07 DIAGNOSIS — N186 End stage renal disease: Secondary | ICD-10-CM | POA: Diagnosis not present

## 2015-01-07 DIAGNOSIS — Z79899 Other long term (current) drug therapy: Secondary | ICD-10-CM | POA: Diagnosis not present

## 2015-01-07 DIAGNOSIS — N2589 Other disorders resulting from impaired renal tubular function: Secondary | ICD-10-CM | POA: Diagnosis not present

## 2015-01-07 DIAGNOSIS — D509 Iron deficiency anemia, unspecified: Secondary | ICD-10-CM | POA: Diagnosis not present

## 2015-01-07 DIAGNOSIS — D630 Anemia in neoplastic disease: Secondary | ICD-10-CM | POA: Diagnosis not present

## 2015-01-07 DIAGNOSIS — E874 Mixed disorder of acid-base balance: Secondary | ICD-10-CM | POA: Diagnosis not present

## 2015-01-07 DIAGNOSIS — E440 Moderate protein-calorie malnutrition: Secondary | ICD-10-CM | POA: Diagnosis not present

## 2015-01-08 DIAGNOSIS — N186 End stage renal disease: Secondary | ICD-10-CM | POA: Diagnosis not present

## 2015-01-08 DIAGNOSIS — E440 Moderate protein-calorie malnutrition: Secondary | ICD-10-CM | POA: Diagnosis not present

## 2015-01-08 DIAGNOSIS — D630 Anemia in neoplastic disease: Secondary | ICD-10-CM | POA: Diagnosis not present

## 2015-01-08 DIAGNOSIS — Z79899 Other long term (current) drug therapy: Secondary | ICD-10-CM | POA: Diagnosis not present

## 2015-01-08 DIAGNOSIS — N2589 Other disorders resulting from impaired renal tubular function: Secondary | ICD-10-CM | POA: Diagnosis not present

## 2015-01-08 DIAGNOSIS — D509 Iron deficiency anemia, unspecified: Secondary | ICD-10-CM | POA: Diagnosis not present

## 2015-01-10 DIAGNOSIS — E440 Moderate protein-calorie malnutrition: Secondary | ICD-10-CM | POA: Diagnosis not present

## 2015-01-10 DIAGNOSIS — D630 Anemia in neoplastic disease: Secondary | ICD-10-CM | POA: Diagnosis not present

## 2015-01-10 DIAGNOSIS — D509 Iron deficiency anemia, unspecified: Secondary | ICD-10-CM | POA: Diagnosis not present

## 2015-01-10 DIAGNOSIS — N2589 Other disorders resulting from impaired renal tubular function: Secondary | ICD-10-CM | POA: Diagnosis not present

## 2015-01-10 DIAGNOSIS — N186 End stage renal disease: Secondary | ICD-10-CM | POA: Diagnosis not present

## 2015-01-10 DIAGNOSIS — Z79899 Other long term (current) drug therapy: Secondary | ICD-10-CM | POA: Diagnosis not present

## 2015-01-12 DIAGNOSIS — N08 Glomerular disorders in diseases classified elsewhere: Secondary | ICD-10-CM | POA: Diagnosis not present

## 2015-01-12 DIAGNOSIS — N186 End stage renal disease: Secondary | ICD-10-CM | POA: Diagnosis not present

## 2015-01-12 DIAGNOSIS — Z992 Dependence on renal dialysis: Secondary | ICD-10-CM | POA: Diagnosis not present

## 2015-01-13 DIAGNOSIS — N186 End stage renal disease: Secondary | ICD-10-CM | POA: Diagnosis not present

## 2015-01-13 DIAGNOSIS — D631 Anemia in chronic kidney disease: Secondary | ICD-10-CM | POA: Diagnosis not present

## 2015-01-14 DIAGNOSIS — N5203 Combined arterial insufficiency and corporo-venous occlusive erectile dysfunction: Secondary | ICD-10-CM | POA: Diagnosis not present

## 2015-01-14 DIAGNOSIS — N186 End stage renal disease: Secondary | ICD-10-CM | POA: Diagnosis not present

## 2015-01-14 DIAGNOSIS — N200 Calculus of kidney: Secondary | ICD-10-CM | POA: Diagnosis not present

## 2015-01-14 DIAGNOSIS — N400 Enlarged prostate without lower urinary tract symptoms: Secondary | ICD-10-CM | POA: Diagnosis not present

## 2015-01-14 DIAGNOSIS — D631 Anemia in chronic kidney disease: Secondary | ICD-10-CM | POA: Diagnosis not present

## 2015-01-14 DIAGNOSIS — N189 Chronic kidney disease, unspecified: Secondary | ICD-10-CM | POA: Diagnosis not present

## 2015-01-15 DIAGNOSIS — Z681 Body mass index (BMI) 19 or less, adult: Secondary | ICD-10-CM | POA: Diagnosis not present

## 2015-01-15 DIAGNOSIS — R000 Tachycardia, unspecified: Secondary | ICD-10-CM | POA: Diagnosis not present

## 2015-01-15 DIAGNOSIS — R011 Cardiac murmur, unspecified: Secondary | ICD-10-CM | POA: Diagnosis not present

## 2015-01-15 DIAGNOSIS — R55 Syncope and collapse: Secondary | ICD-10-CM | POA: Diagnosis not present

## 2015-01-16 DIAGNOSIS — Z1389 Encounter for screening for other disorder: Secondary | ICD-10-CM | POA: Diagnosis not present

## 2015-01-16 DIAGNOSIS — Z992 Dependence on renal dialysis: Secondary | ICD-10-CM | POA: Diagnosis not present

## 2015-01-16 DIAGNOSIS — D631 Anemia in chronic kidney disease: Secondary | ICD-10-CM | POA: Diagnosis not present

## 2015-01-16 DIAGNOSIS — E780 Pure hypercholesterolemia: Secondary | ICD-10-CM | POA: Diagnosis not present

## 2015-01-16 DIAGNOSIS — J3089 Other allergic rhinitis: Secondary | ICD-10-CM | POA: Diagnosis not present

## 2015-01-16 DIAGNOSIS — N186 End stage renal disease: Secondary | ICD-10-CM | POA: Diagnosis not present

## 2015-01-16 DIAGNOSIS — Z79899 Other long term (current) drug therapy: Secondary | ICD-10-CM | POA: Diagnosis not present

## 2015-01-16 DIAGNOSIS — D509 Iron deficiency anemia, unspecified: Secondary | ICD-10-CM | POA: Diagnosis not present

## 2015-01-17 DIAGNOSIS — D631 Anemia in chronic kidney disease: Secondary | ICD-10-CM | POA: Diagnosis not present

## 2015-01-17 DIAGNOSIS — N186 End stage renal disease: Secondary | ICD-10-CM | POA: Diagnosis not present

## 2015-01-18 DIAGNOSIS — D631 Anemia in chronic kidney disease: Secondary | ICD-10-CM | POA: Diagnosis not present

## 2015-01-18 DIAGNOSIS — N186 End stage renal disease: Secondary | ICD-10-CM | POA: Diagnosis not present

## 2015-01-20 DIAGNOSIS — N186 End stage renal disease: Secondary | ICD-10-CM | POA: Diagnosis not present

## 2015-01-20 DIAGNOSIS — D631 Anemia in chronic kidney disease: Secondary | ICD-10-CM | POA: Diagnosis not present

## 2015-01-21 DIAGNOSIS — D631 Anemia in chronic kidney disease: Secondary | ICD-10-CM | POA: Diagnosis not present

## 2015-01-21 DIAGNOSIS — N186 End stage renal disease: Secondary | ICD-10-CM | POA: Diagnosis not present

## 2015-01-22 DIAGNOSIS — I517 Cardiomegaly: Secondary | ICD-10-CM | POA: Diagnosis not present

## 2015-01-23 DIAGNOSIS — N186 End stage renal disease: Secondary | ICD-10-CM | POA: Diagnosis not present

## 2015-01-23 DIAGNOSIS — D631 Anemia in chronic kidney disease: Secondary | ICD-10-CM | POA: Diagnosis not present

## 2015-01-24 ENCOUNTER — Ambulatory Visit (INDEPENDENT_AMBULATORY_CARE_PROVIDER_SITE_OTHER): Payer: Medicare Other | Admitting: Vascular Surgery

## 2015-01-24 ENCOUNTER — Other Ambulatory Visit: Payer: Self-pay

## 2015-01-24 ENCOUNTER — Encounter: Payer: Self-pay | Admitting: Vascular Surgery

## 2015-01-24 VITALS — BP 130/81 | HR 90 | Ht 75.0 in | Wt 147.7 lb

## 2015-01-24 DIAGNOSIS — Z992 Dependence on renal dialysis: Secondary | ICD-10-CM

## 2015-01-24 DIAGNOSIS — I871 Compression of vein: Secondary | ICD-10-CM

## 2015-01-24 DIAGNOSIS — N186 End stage renal disease: Secondary | ICD-10-CM | POA: Diagnosis not present

## 2015-01-24 DIAGNOSIS — D631 Anemia in chronic kidney disease: Secondary | ICD-10-CM | POA: Diagnosis not present

## 2015-01-24 NOTE — Progress Notes (Signed)
Established Dialysis Access  History of Present Illness  Christopher Ritter is a 44 y.o. (1970-08-08) male who presents as an add-on today with chief complaint: prolonged bleeding after dialysis for one week. The patient has a left upper arm fistula that was created around four years ago. 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 He has not has any other accesses. He currently dialyzes at home on Mondays, Tuesdays, Thursdays, Fridays and Saturdays via a buttonhole method. He notes bleeding from these cannulation sites during and after dialysis. About one month ago, he was seen at [facility] and underwent a fistulogram with venoplasty. We do not have access to these records. On 11/07/14 he was seen by Dr. Oneida Alar for pseudoaneurysm evaluation. He reports that his prior blistered area is improving.    Past Medical History  Diagnosis Date  . Hypertension   . Chronic kidney disease   . Chronic headaches     posterior occipital headaches  . Heart murmur   . History of renal calculi   . Allergy   . Anemia     Iron deficiency  . Thyroid disease   . History of Cytoxan exposure   . GERD (gastroesophageal reflux disease)     hx of- no meds currently  . Coronary artery disease     Mitral Valve Prolapse- per Cardiologist Dr. Bettina Gavia  . Constipation     Past Surgical History  Procedure Laterality Date  . Av fistula placement    . Diatek placed and removed      UNTIL FISTULA MATURED  . Thrombectomy w/ embolectomy  07/08/2011    Procedure: THROMBECTOMY ARTERIOVENOUS FISTULA;  Surgeon: Mal Misty, MD;  Location: [facility];  Service: Vascular;  Laterality: Left;  Incision of Ulcerated Skin and Revision of Left Upper Arm Fistula  . Revison of arteriovenous fistula Left 11/28/2012    Procedure: REVISON OF ARTERIOVENOUS FISTULA;  Surgeon: Elam Dutch, MD;  Location: [facility] OR;  Service: Vascular;  Laterality: Left;  Plication     History   Social  History  . Marital Status: Married    Spouse Name: N/A  . Number of Children: N/A  . Years of Education: N/A   Occupational History  . Not on file.   Social History Main Topics  . Smoking status: Never Smoker   . Smokeless tobacco: Never Used  . Alcohol Use: No  . Drug Use: No  . Sexual Activity: Not on file   Other Topics Concern  . Not on file   Social History Narrative    Family History  Problem Relation Age of Onset  . Anesthesia problems Neg Hx     Current Outpatient Prescriptions  Medication Sig Dispense Refill  . amLODipine (NORVASC) 10 MG tablet Take 10 mg by mouth daily.     Marland Kitchen b complex-vitamin c-folic acid (NEPHRO-VITE) 0.8 MG TABS Take 0.8 mg by mouth at bedtime.     . calcium acetate (PHOSLO) 667 MG capsule Take 667 mg by mouth 3 (three) times daily before meals.     . cetirizine (ZYRTEC) 10 MG tablet Take 10 mg by mouth daily.    Marland Kitchen diltiazem (TIAZAC) 180 MG 24 hr capsule Take 180 mg by mouth daily.    Marland Kitchen HYDROcodone-acetaminophen (NORCO) 10-325 MG per tablet Take 1 tablet by mouth every 4 (four) hours as needed.    . lubiprostone (AMITIZA) 24 MCG capsule Take 24 mcg by mouth 2 (two) times  daily as needed for constipation.    . promethazine (PHENERGAN) 25 MG tablet Take 25 mg by mouth every 8 (eight) hours as needed for nausea or vomiting.    . simvastatin (ZOCOR) 20 MG tablet Take 20 mg by mouth at bedtime.     Marland Kitchen azithromycin (ZITHROMAX) 250 MG tablet     . diltiazem (CARDIZEM CD) 180 MG 24 hr capsule     . HYDROcodone-acetaminophen (NORCO) 5-325 MG per tablet Take 1 tablet by mouth every 6 (six) hours as needed for pain. (Patient not taking: Reported on 11/07/2014) 30 tablet 0  . levofloxacin (LEVAQUIN) 250 MG tablet     . oxyCODONE-acetaminophen (ROXICET) 5-325 MG per tablet Take 1 tablet by mouth every 4 (four) hours as needed for pain. (Patient not taking: Reported on 11/07/2014) 30 tablet 0  . SENSIPAR 30 MG tablet     . sulfamethoxazole-trimethoprim (BACTRIM  DS,SEPTRA DS) 800-160 MG per tablet      No current facility-administered medications for this visit.     Allergies  Allergen Reactions  . Lisinopril Cough     REVIEW OF SYSTEMS:  (Positives checked otherwise negative)  CARDIOVASCULAR:   [ ]  chest pain,  [ ]  chest pressure,  [ ]  palpitations,  [ ]  shortness of breath when laying flat,  [ ]  shortness of breath with exertion,   [ ]  pain in feet when walking,  [ ]  pain in feet when laying flat, [ ]  history of blood clot in veins (DVT),  [ ]  history of phlebitis,  [ ]  swelling in legs,  [ ]  varicose veins  PULMONARY:   [ ]  productive cough,  [ ]  asthma,  [ ]  wheezing  NEUROLOGIC:   [ ]  weakness in arms or legs,  [ ]  numbness in arms or legs,  [ ]  difficulty speaking or slurred speech,  [ ]  temporary loss of vision in one eye,  [ ]  dizziness  HEMATOLOGIC:   [ ]  bleeding problems,  [ ]  problems with blood clotting too easily  MUSCULOSKEL:   [ ]  joint pain, [ ]  joint swelling  GASTROINTEST:   [ ]  vomiting blood,  [ ]  blood in stool     GENITOURINARY:   [ ]  burning with urination,  [ ]  blood in urine [x]  ESRD-HD: home schedule  PSYCHIATRIC:   [ ]  history of major depression  INTEGUMENTARY:   [ ]  rashes,  [ ]  ulcers  CONSTITUTIONAL:   [ ]  fever,  [ ]  chills   Physical Examination  Filed Vitals:   01/24/15 1337  BP: 130/81  Pulse: 90  Height: 6\' 3"  (1.905 m)  Weight: 147 lb 11.2 oz (66.996 kg)  SpO2: 96%   Body mass index is 18.46 kg/(m^2).  General: A&O x 3, WD thin male in NAD  Pulmonary: Sym exp, good air movt  Vascular: 2+ left radial pulse. Diffusely enlarged left brachial cephalic fistula with easily palpable thrill. No ulcerations or skin changes seen.   Musculoskeletal: M/S 5/5 throughout. Extremities without ischemic changes.   Neurologic: No focal deficits. Pain and light touch intact in extremities.  Medical Decision Making  Christopher Ritter is a 44 y.o. male who presents with  pseuodaneurysmal degeneration of his left brachiocephalic fistula with prolonged bleeding episodes. He is on home dialysis (M,T, TH, F, Sa).   He underwent fistulogram with venoplasty one month ago at [facility].   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  Plan for left upper extremity fistulogram with possible intervention  on 02/13/15 with Dr. Bridgett Larsson.  If his stenosis is amenable to venoplasty, the patient will need a new dialysis access site.   Virgina Jock, PA-C [facility] Office: [phone] Pager: [phone]  01/24/2015, 2:01 PM  This patient was seen and examined in conjunction with Dr. Bridgett Larsson.   Addendum  I have independently interviewed and examined the patient, and I agree with the physician assistant's findings.  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  I would proceed with L arm fistulogram, possible cutting venoplasty.  I have discussed with the patient possibility that this fistula may not be salvageable.  I discussed with the patient the nature of angiographic procedures, especially the limited patencies of any endovascular intervention.  The patient is aware of that the risks of an angiographic procedure include but are not limited to: bleeding, infection, access site complications, renal failure, embolization, rupture of vessel, dissection, possible need for emergent surgical intervention, possible need for surgical procedures to treat the patient's pathology, anaphylactic reaction to contrast, and stroke and death.  The patient is aware of the risks and agrees to proceed.   Adele Barthel, MD [facility] [place] Office: [phone] Pager: [phone]  01/24/2015, 5:22 PM

## 2015-01-25 DIAGNOSIS — D631 Anemia in chronic kidney disease: Secondary | ICD-10-CM | POA: Diagnosis not present

## 2015-01-25 DIAGNOSIS — N186 End stage renal disease: Secondary | ICD-10-CM | POA: Diagnosis not present

## 2015-01-27 DIAGNOSIS — N186 End stage renal disease: Secondary | ICD-10-CM | POA: Diagnosis not present

## 2015-01-28 DIAGNOSIS — D631 Anemia in chronic kidney disease: Secondary | ICD-10-CM | POA: Diagnosis not present

## 2015-01-28 DIAGNOSIS — N186 End stage renal disease: Secondary | ICD-10-CM | POA: Diagnosis not present

## 2015-01-30 DIAGNOSIS — D631 Anemia in chronic kidney disease: Secondary | ICD-10-CM | POA: Diagnosis not present

## 2015-01-30 DIAGNOSIS — N186 End stage renal disease: Secondary | ICD-10-CM | POA: Diagnosis not present

## 2015-01-31 DIAGNOSIS — D631 Anemia in chronic kidney disease: Secondary | ICD-10-CM | POA: Diagnosis not present

## 2015-01-31 DIAGNOSIS — N186 End stage renal disease: Secondary | ICD-10-CM | POA: Diagnosis not present

## 2015-02-01 DIAGNOSIS — N186 End stage renal disease: Secondary | ICD-10-CM | POA: Diagnosis not present

## 2015-02-01 DIAGNOSIS — D631 Anemia in chronic kidney disease: Secondary | ICD-10-CM | POA: Diagnosis not present

## 2015-02-02 DIAGNOSIS — N08 Glomerular disorders in diseases classified elsewhere: Secondary | ICD-10-CM | POA: Diagnosis not present

## 2015-02-02 DIAGNOSIS — N186 End stage renal disease: Secondary | ICD-10-CM | POA: Diagnosis not present

## 2015-02-02 DIAGNOSIS — Z992 Dependence on renal dialysis: Secondary | ICD-10-CM | POA: Diagnosis not present

## 2015-02-03 DIAGNOSIS — E440 Moderate protein-calorie malnutrition: Secondary | ICD-10-CM | POA: Diagnosis not present

## 2015-02-03 DIAGNOSIS — N2589 Other disorders resulting from impaired renal tubular function: Secondary | ICD-10-CM | POA: Diagnosis not present

## 2015-02-03 DIAGNOSIS — D630 Anemia in neoplastic disease: Secondary | ICD-10-CM | POA: Diagnosis not present

## 2015-02-03 DIAGNOSIS — N186 End stage renal disease: Secondary | ICD-10-CM | POA: Diagnosis not present

## 2015-02-03 DIAGNOSIS — Z4931 Encounter for adequacy testing for hemodialysis: Secondary | ICD-10-CM | POA: Diagnosis not present

## 2015-02-03 DIAGNOSIS — N2581 Secondary hyperparathyroidism of renal origin: Secondary | ICD-10-CM | POA: Diagnosis not present

## 2015-02-03 DIAGNOSIS — Z79899 Other long term (current) drug therapy: Secondary | ICD-10-CM | POA: Diagnosis not present

## 2015-02-03 DIAGNOSIS — D509 Iron deficiency anemia, unspecified: Secondary | ICD-10-CM | POA: Diagnosis not present

## 2015-02-04 DIAGNOSIS — Z79899 Other long term (current) drug therapy: Secondary | ICD-10-CM | POA: Diagnosis not present

## 2015-02-04 DIAGNOSIS — D630 Anemia in neoplastic disease: Secondary | ICD-10-CM | POA: Diagnosis not present

## 2015-02-04 DIAGNOSIS — Z4931 Encounter for adequacy testing for hemodialysis: Secondary | ICD-10-CM | POA: Diagnosis not present

## 2015-02-04 DIAGNOSIS — N186 End stage renal disease: Secondary | ICD-10-CM | POA: Diagnosis not present

## 2015-02-04 DIAGNOSIS — D509 Iron deficiency anemia, unspecified: Secondary | ICD-10-CM | POA: Diagnosis not present

## 2015-02-04 DIAGNOSIS — N2581 Secondary hyperparathyroidism of renal origin: Secondary | ICD-10-CM | POA: Diagnosis not present

## 2015-02-06 DIAGNOSIS — N186 End stage renal disease: Secondary | ICD-10-CM | POA: Diagnosis not present

## 2015-02-06 DIAGNOSIS — N2581 Secondary hyperparathyroidism of renal origin: Secondary | ICD-10-CM | POA: Diagnosis not present

## 2015-02-06 DIAGNOSIS — D630 Anemia in neoplastic disease: Secondary | ICD-10-CM | POA: Diagnosis not present

## 2015-02-06 DIAGNOSIS — D509 Iron deficiency anemia, unspecified: Secondary | ICD-10-CM | POA: Diagnosis not present

## 2015-02-06 DIAGNOSIS — Z79899 Other long term (current) drug therapy: Secondary | ICD-10-CM | POA: Diagnosis not present

## 2015-02-06 DIAGNOSIS — Z4931 Encounter for adequacy testing for hemodialysis: Secondary | ICD-10-CM | POA: Diagnosis not present

## 2015-02-07 DIAGNOSIS — Z992 Dependence on renal dialysis: Secondary | ICD-10-CM | POA: Diagnosis not present

## 2015-02-07 DIAGNOSIS — Z4931 Encounter for adequacy testing for hemodialysis: Secondary | ICD-10-CM | POA: Diagnosis not present

## 2015-02-07 DIAGNOSIS — Z79899 Other long term (current) drug therapy: Secondary | ICD-10-CM | POA: Diagnosis not present

## 2015-02-07 DIAGNOSIS — D509 Iron deficiency anemia, unspecified: Secondary | ICD-10-CM | POA: Diagnosis not present

## 2015-02-07 DIAGNOSIS — D630 Anemia in neoplastic disease: Secondary | ICD-10-CM | POA: Diagnosis not present

## 2015-02-07 DIAGNOSIS — N186 End stage renal disease: Secondary | ICD-10-CM | POA: Diagnosis not present

## 2015-02-07 DIAGNOSIS — N2581 Secondary hyperparathyroidism of renal origin: Secondary | ICD-10-CM | POA: Diagnosis not present

## 2015-02-08 DIAGNOSIS — N2581 Secondary hyperparathyroidism of renal origin: Secondary | ICD-10-CM | POA: Diagnosis not present

## 2015-02-08 DIAGNOSIS — N186 End stage renal disease: Secondary | ICD-10-CM | POA: Diagnosis not present

## 2015-02-08 DIAGNOSIS — D509 Iron deficiency anemia, unspecified: Secondary | ICD-10-CM | POA: Diagnosis not present

## 2015-02-08 DIAGNOSIS — D630 Anemia in neoplastic disease: Secondary | ICD-10-CM | POA: Diagnosis not present

## 2015-02-08 DIAGNOSIS — Z4931 Encounter for adequacy testing for hemodialysis: Secondary | ICD-10-CM | POA: Diagnosis not present

## 2015-02-08 DIAGNOSIS — Z79899 Other long term (current) drug therapy: Secondary | ICD-10-CM | POA: Diagnosis not present

## 2015-02-10 DIAGNOSIS — N186 End stage renal disease: Secondary | ICD-10-CM | POA: Diagnosis not present

## 2015-02-10 DIAGNOSIS — Z4931 Encounter for adequacy testing for hemodialysis: Secondary | ICD-10-CM | POA: Diagnosis not present

## 2015-02-10 DIAGNOSIS — D630 Anemia in neoplastic disease: Secondary | ICD-10-CM | POA: Diagnosis not present

## 2015-02-10 DIAGNOSIS — D509 Iron deficiency anemia, unspecified: Secondary | ICD-10-CM | POA: Diagnosis not present

## 2015-02-10 DIAGNOSIS — N2581 Secondary hyperparathyroidism of renal origin: Secondary | ICD-10-CM | POA: Diagnosis not present

## 2015-02-10 DIAGNOSIS — Z79899 Other long term (current) drug therapy: Secondary | ICD-10-CM | POA: Diagnosis not present

## 2015-02-11 DIAGNOSIS — Z79899 Other long term (current) drug therapy: Secondary | ICD-10-CM | POA: Diagnosis not present

## 2015-02-11 DIAGNOSIS — N2581 Secondary hyperparathyroidism of renal origin: Secondary | ICD-10-CM | POA: Diagnosis not present

## 2015-02-11 DIAGNOSIS — D630 Anemia in neoplastic disease: Secondary | ICD-10-CM | POA: Diagnosis not present

## 2015-02-11 DIAGNOSIS — N186 End stage renal disease: Secondary | ICD-10-CM | POA: Diagnosis not present

## 2015-02-11 DIAGNOSIS — Z4931 Encounter for adequacy testing for hemodialysis: Secondary | ICD-10-CM | POA: Diagnosis not present

## 2015-02-11 DIAGNOSIS — D509 Iron deficiency anemia, unspecified: Secondary | ICD-10-CM | POA: Diagnosis not present

## 2015-02-13 DIAGNOSIS — Z4931 Encounter for adequacy testing for hemodialysis: Secondary | ICD-10-CM | POA: Diagnosis not present

## 2015-02-13 DIAGNOSIS — N2581 Secondary hyperparathyroidism of renal origin: Secondary | ICD-10-CM | POA: Diagnosis not present

## 2015-02-13 DIAGNOSIS — D630 Anemia in neoplastic disease: Secondary | ICD-10-CM | POA: Diagnosis not present

## 2015-02-13 DIAGNOSIS — D509 Iron deficiency anemia, unspecified: Secondary | ICD-10-CM | POA: Diagnosis not present

## 2015-02-13 DIAGNOSIS — Z79899 Other long term (current) drug therapy: Secondary | ICD-10-CM | POA: Diagnosis not present

## 2015-02-13 DIAGNOSIS — N186 End stage renal disease: Secondary | ICD-10-CM | POA: Diagnosis not present

## 2015-02-14 DIAGNOSIS — N186 End stage renal disease: Secondary | ICD-10-CM | POA: Diagnosis not present

## 2015-02-14 DIAGNOSIS — Z4931 Encounter for adequacy testing for hemodialysis: Secondary | ICD-10-CM | POA: Diagnosis not present

## 2015-02-14 DIAGNOSIS — N2581 Secondary hyperparathyroidism of renal origin: Secondary | ICD-10-CM | POA: Diagnosis not present

## 2015-02-14 DIAGNOSIS — D509 Iron deficiency anemia, unspecified: Secondary | ICD-10-CM | POA: Diagnosis not present

## 2015-02-14 DIAGNOSIS — Z79899 Other long term (current) drug therapy: Secondary | ICD-10-CM | POA: Diagnosis not present

## 2015-02-14 DIAGNOSIS — D630 Anemia in neoplastic disease: Secondary | ICD-10-CM | POA: Diagnosis not present

## 2015-02-15 DIAGNOSIS — Z79899 Other long term (current) drug therapy: Secondary | ICD-10-CM | POA: Diagnosis not present

## 2015-02-15 DIAGNOSIS — D630 Anemia in neoplastic disease: Secondary | ICD-10-CM | POA: Diagnosis not present

## 2015-02-15 DIAGNOSIS — N2581 Secondary hyperparathyroidism of renal origin: Secondary | ICD-10-CM | POA: Diagnosis not present

## 2015-02-15 DIAGNOSIS — D509 Iron deficiency anemia, unspecified: Secondary | ICD-10-CM | POA: Diagnosis not present

## 2015-02-15 DIAGNOSIS — Z4931 Encounter for adequacy testing for hemodialysis: Secondary | ICD-10-CM | POA: Diagnosis not present

## 2015-02-15 DIAGNOSIS — N186 End stage renal disease: Secondary | ICD-10-CM | POA: Diagnosis not present

## 2015-02-17 DIAGNOSIS — D630 Anemia in neoplastic disease: Secondary | ICD-10-CM | POA: Diagnosis not present

## 2015-02-17 DIAGNOSIS — N186 End stage renal disease: Secondary | ICD-10-CM | POA: Diagnosis not present

## 2015-02-17 DIAGNOSIS — Z79899 Other long term (current) drug therapy: Secondary | ICD-10-CM | POA: Diagnosis not present

## 2015-02-17 DIAGNOSIS — Z4931 Encounter for adequacy testing for hemodialysis: Secondary | ICD-10-CM | POA: Diagnosis not present

## 2015-02-17 DIAGNOSIS — D509 Iron deficiency anemia, unspecified: Secondary | ICD-10-CM | POA: Diagnosis not present

## 2015-02-17 DIAGNOSIS — N2581 Secondary hyperparathyroidism of renal origin: Secondary | ICD-10-CM | POA: Diagnosis not present

## 2015-02-18 DIAGNOSIS — D630 Anemia in neoplastic disease: Secondary | ICD-10-CM | POA: Diagnosis not present

## 2015-02-18 DIAGNOSIS — Z79899 Other long term (current) drug therapy: Secondary | ICD-10-CM | POA: Diagnosis not present

## 2015-02-18 DIAGNOSIS — M87021 Idiopathic aseptic necrosis of right humerus: Secondary | ICD-10-CM | POA: Diagnosis not present

## 2015-02-18 DIAGNOSIS — G894 Chronic pain syndrome: Secondary | ICD-10-CM | POA: Diagnosis not present

## 2015-02-18 DIAGNOSIS — M87022 Idiopathic aseptic necrosis of left humerus: Secondary | ICD-10-CM | POA: Diagnosis not present

## 2015-02-18 DIAGNOSIS — N186 End stage renal disease: Secondary | ICD-10-CM | POA: Diagnosis not present

## 2015-02-18 DIAGNOSIS — N2581 Secondary hyperparathyroidism of renal origin: Secondary | ICD-10-CM | POA: Diagnosis not present

## 2015-02-18 DIAGNOSIS — D509 Iron deficiency anemia, unspecified: Secondary | ICD-10-CM | POA: Diagnosis not present

## 2015-02-18 DIAGNOSIS — Z4931 Encounter for adequacy testing for hemodialysis: Secondary | ICD-10-CM | POA: Diagnosis not present

## 2015-02-18 DIAGNOSIS — Z79891 Long term (current) use of opiate analgesic: Secondary | ICD-10-CM | POA: Diagnosis not present

## 2015-02-20 DIAGNOSIS — Z79899 Other long term (current) drug therapy: Secondary | ICD-10-CM | POA: Diagnosis not present

## 2015-02-20 DIAGNOSIS — D630 Anemia in neoplastic disease: Secondary | ICD-10-CM | POA: Diagnosis not present

## 2015-02-20 DIAGNOSIS — N2581 Secondary hyperparathyroidism of renal origin: Secondary | ICD-10-CM | POA: Diagnosis not present

## 2015-02-20 DIAGNOSIS — N186 End stage renal disease: Secondary | ICD-10-CM | POA: Diagnosis not present

## 2015-02-20 DIAGNOSIS — D509 Iron deficiency anemia, unspecified: Secondary | ICD-10-CM | POA: Diagnosis not present

## 2015-02-20 DIAGNOSIS — Z4931 Encounter for adequacy testing for hemodialysis: Secondary | ICD-10-CM | POA: Diagnosis not present

## 2015-02-21 DIAGNOSIS — D509 Iron deficiency anemia, unspecified: Secondary | ICD-10-CM | POA: Diagnosis not present

## 2015-02-21 DIAGNOSIS — D630 Anemia in neoplastic disease: Secondary | ICD-10-CM | POA: Diagnosis not present

## 2015-02-21 DIAGNOSIS — Z4931 Encounter for adequacy testing for hemodialysis: Secondary | ICD-10-CM | POA: Diagnosis not present

## 2015-02-21 DIAGNOSIS — Z79899 Other long term (current) drug therapy: Secondary | ICD-10-CM | POA: Diagnosis not present

## 2015-02-21 DIAGNOSIS — N186 End stage renal disease: Secondary | ICD-10-CM | POA: Diagnosis not present

## 2015-02-21 DIAGNOSIS — N2581 Secondary hyperparathyroidism of renal origin: Secondary | ICD-10-CM | POA: Diagnosis not present

## 2015-02-23 DIAGNOSIS — Z4931 Encounter for adequacy testing for hemodialysis: Secondary | ICD-10-CM | POA: Diagnosis not present

## 2015-02-23 DIAGNOSIS — D630 Anemia in neoplastic disease: Secondary | ICD-10-CM | POA: Diagnosis not present

## 2015-02-23 DIAGNOSIS — N186 End stage renal disease: Secondary | ICD-10-CM | POA: Diagnosis not present

## 2015-02-23 DIAGNOSIS — D509 Iron deficiency anemia, unspecified: Secondary | ICD-10-CM | POA: Diagnosis not present

## 2015-02-23 DIAGNOSIS — N2581 Secondary hyperparathyroidism of renal origin: Secondary | ICD-10-CM | POA: Diagnosis not present

## 2015-02-23 DIAGNOSIS — Z79899 Other long term (current) drug therapy: Secondary | ICD-10-CM | POA: Diagnosis not present

## 2015-02-25 DIAGNOSIS — Z4931 Encounter for adequacy testing for hemodialysis: Secondary | ICD-10-CM | POA: Diagnosis not present

## 2015-02-25 DIAGNOSIS — D509 Iron deficiency anemia, unspecified: Secondary | ICD-10-CM | POA: Diagnosis not present

## 2015-02-25 DIAGNOSIS — N2581 Secondary hyperparathyroidism of renal origin: Secondary | ICD-10-CM | POA: Diagnosis not present

## 2015-02-25 DIAGNOSIS — N186 End stage renal disease: Secondary | ICD-10-CM | POA: Diagnosis not present

## 2015-02-25 DIAGNOSIS — Z79899 Other long term (current) drug therapy: Secondary | ICD-10-CM | POA: Diagnosis not present

## 2015-02-25 DIAGNOSIS — D630 Anemia in neoplastic disease: Secondary | ICD-10-CM | POA: Diagnosis not present

## 2015-02-28 DIAGNOSIS — D509 Iron deficiency anemia, unspecified: Secondary | ICD-10-CM | POA: Diagnosis not present

## 2015-02-28 DIAGNOSIS — Z79899 Other long term (current) drug therapy: Secondary | ICD-10-CM | POA: Diagnosis not present

## 2015-02-28 DIAGNOSIS — N2581 Secondary hyperparathyroidism of renal origin: Secondary | ICD-10-CM | POA: Diagnosis not present

## 2015-02-28 DIAGNOSIS — D630 Anemia in neoplastic disease: Secondary | ICD-10-CM | POA: Diagnosis not present

## 2015-02-28 DIAGNOSIS — Z4931 Encounter for adequacy testing for hemodialysis: Secondary | ICD-10-CM | POA: Diagnosis not present

## 2015-02-28 DIAGNOSIS — N186 End stage renal disease: Secondary | ICD-10-CM | POA: Diagnosis not present

## 2015-03-02 DIAGNOSIS — Z79899 Other long term (current) drug therapy: Secondary | ICD-10-CM | POA: Diagnosis not present

## 2015-03-02 DIAGNOSIS — N186 End stage renal disease: Secondary | ICD-10-CM | POA: Diagnosis not present

## 2015-03-02 DIAGNOSIS — N2581 Secondary hyperparathyroidism of renal origin: Secondary | ICD-10-CM | POA: Diagnosis not present

## 2015-03-02 DIAGNOSIS — Z4931 Encounter for adequacy testing for hemodialysis: Secondary | ICD-10-CM | POA: Diagnosis not present

## 2015-03-02 DIAGNOSIS — D630 Anemia in neoplastic disease: Secondary | ICD-10-CM | POA: Diagnosis not present

## 2015-03-02 DIAGNOSIS — D509 Iron deficiency anemia, unspecified: Secondary | ICD-10-CM | POA: Diagnosis not present

## 2015-03-04 DIAGNOSIS — Z4931 Encounter for adequacy testing for hemodialysis: Secondary | ICD-10-CM | POA: Diagnosis not present

## 2015-03-04 DIAGNOSIS — D630 Anemia in neoplastic disease: Secondary | ICD-10-CM | POA: Diagnosis not present

## 2015-03-04 DIAGNOSIS — N2581 Secondary hyperparathyroidism of renal origin: Secondary | ICD-10-CM | POA: Diagnosis not present

## 2015-03-04 DIAGNOSIS — N186 End stage renal disease: Secondary | ICD-10-CM | POA: Diagnosis not present

## 2015-03-04 DIAGNOSIS — D509 Iron deficiency anemia, unspecified: Secondary | ICD-10-CM | POA: Diagnosis not present

## 2015-03-04 DIAGNOSIS — Z79899 Other long term (current) drug therapy: Secondary | ICD-10-CM | POA: Diagnosis not present

## 2015-03-05 DIAGNOSIS — N186 End stage renal disease: Secondary | ICD-10-CM | POA: Diagnosis not present

## 2015-03-05 DIAGNOSIS — N08 Glomerular disorders in diseases classified elsewhere: Secondary | ICD-10-CM | POA: Diagnosis not present

## 2015-03-05 DIAGNOSIS — Z992 Dependence on renal dialysis: Secondary | ICD-10-CM | POA: Diagnosis not present

## 2015-03-06 DIAGNOSIS — Z4931 Encounter for adequacy testing for hemodialysis: Secondary | ICD-10-CM | POA: Diagnosis not present

## 2015-03-06 DIAGNOSIS — D630 Anemia in neoplastic disease: Secondary | ICD-10-CM | POA: Diagnosis not present

## 2015-03-06 DIAGNOSIS — E440 Moderate protein-calorie malnutrition: Secondary | ICD-10-CM | POA: Diagnosis not present

## 2015-03-06 DIAGNOSIS — N2589 Other disorders resulting from impaired renal tubular function: Secondary | ICD-10-CM | POA: Diagnosis not present

## 2015-03-06 DIAGNOSIS — Z79899 Other long term (current) drug therapy: Secondary | ICD-10-CM | POA: Diagnosis not present

## 2015-03-06 DIAGNOSIS — D631 Anemia in chronic kidney disease: Secondary | ICD-10-CM | POA: Diagnosis not present

## 2015-03-06 DIAGNOSIS — Z23 Encounter for immunization: Secondary | ICD-10-CM | POA: Diagnosis not present

## 2015-03-06 DIAGNOSIS — N2581 Secondary hyperparathyroidism of renal origin: Secondary | ICD-10-CM | POA: Diagnosis not present

## 2015-03-06 DIAGNOSIS — N186 End stage renal disease: Secondary | ICD-10-CM | POA: Diagnosis not present

## 2015-03-06 DIAGNOSIS — D509 Iron deficiency anemia, unspecified: Secondary | ICD-10-CM | POA: Diagnosis not present

## 2015-03-08 DIAGNOSIS — N186 End stage renal disease: Secondary | ICD-10-CM | POA: Diagnosis not present

## 2015-03-08 DIAGNOSIS — D631 Anemia in chronic kidney disease: Secondary | ICD-10-CM | POA: Diagnosis not present

## 2015-03-08 DIAGNOSIS — D630 Anemia in neoplastic disease: Secondary | ICD-10-CM | POA: Diagnosis not present

## 2015-03-08 DIAGNOSIS — Z79899 Other long term (current) drug therapy: Secondary | ICD-10-CM | POA: Diagnosis not present

## 2015-03-08 DIAGNOSIS — Z4931 Encounter for adequacy testing for hemodialysis: Secondary | ICD-10-CM | POA: Diagnosis not present

## 2015-03-08 DIAGNOSIS — D509 Iron deficiency anemia, unspecified: Secondary | ICD-10-CM | POA: Diagnosis not present

## 2015-03-10 DIAGNOSIS — N186 End stage renal disease: Secondary | ICD-10-CM | POA: Diagnosis not present

## 2015-03-10 DIAGNOSIS — Z4931 Encounter for adequacy testing for hemodialysis: Secondary | ICD-10-CM | POA: Diagnosis not present

## 2015-03-10 DIAGNOSIS — Z79899 Other long term (current) drug therapy: Secondary | ICD-10-CM | POA: Diagnosis not present

## 2015-03-10 DIAGNOSIS — D630 Anemia in neoplastic disease: Secondary | ICD-10-CM | POA: Diagnosis not present

## 2015-03-10 DIAGNOSIS — D631 Anemia in chronic kidney disease: Secondary | ICD-10-CM | POA: Diagnosis not present

## 2015-03-10 DIAGNOSIS — D509 Iron deficiency anemia, unspecified: Secondary | ICD-10-CM | POA: Diagnosis not present

## 2015-03-12 DIAGNOSIS — Z4931 Encounter for adequacy testing for hemodialysis: Secondary | ICD-10-CM | POA: Diagnosis not present

## 2015-03-12 DIAGNOSIS — D631 Anemia in chronic kidney disease: Secondary | ICD-10-CM | POA: Diagnosis not present

## 2015-03-12 DIAGNOSIS — D630 Anemia in neoplastic disease: Secondary | ICD-10-CM | POA: Diagnosis not present

## 2015-03-12 DIAGNOSIS — Z79899 Other long term (current) drug therapy: Secondary | ICD-10-CM | POA: Diagnosis not present

## 2015-03-12 DIAGNOSIS — N186 End stage renal disease: Secondary | ICD-10-CM | POA: Diagnosis not present

## 2015-03-12 DIAGNOSIS — D509 Iron deficiency anemia, unspecified: Secondary | ICD-10-CM | POA: Diagnosis not present

## 2015-03-13 DIAGNOSIS — Z4931 Encounter for adequacy testing for hemodialysis: Secondary | ICD-10-CM | POA: Diagnosis not present

## 2015-03-13 DIAGNOSIS — Z79899 Other long term (current) drug therapy: Secondary | ICD-10-CM | POA: Diagnosis not present

## 2015-03-13 DIAGNOSIS — N186 End stage renal disease: Secondary | ICD-10-CM | POA: Diagnosis not present

## 2015-03-13 DIAGNOSIS — D509 Iron deficiency anemia, unspecified: Secondary | ICD-10-CM | POA: Diagnosis not present

## 2015-03-13 DIAGNOSIS — D630 Anemia in neoplastic disease: Secondary | ICD-10-CM | POA: Diagnosis not present

## 2015-03-13 DIAGNOSIS — D631 Anemia in chronic kidney disease: Secondary | ICD-10-CM | POA: Diagnosis not present

## 2015-03-15 DIAGNOSIS — D631 Anemia in chronic kidney disease: Secondary | ICD-10-CM | POA: Diagnosis not present

## 2015-03-15 DIAGNOSIS — Z4931 Encounter for adequacy testing for hemodialysis: Secondary | ICD-10-CM | POA: Diagnosis not present

## 2015-03-15 DIAGNOSIS — D509 Iron deficiency anemia, unspecified: Secondary | ICD-10-CM | POA: Diagnosis not present

## 2015-03-15 DIAGNOSIS — Z79899 Other long term (current) drug therapy: Secondary | ICD-10-CM | POA: Diagnosis not present

## 2015-03-15 DIAGNOSIS — D630 Anemia in neoplastic disease: Secondary | ICD-10-CM | POA: Diagnosis not present

## 2015-03-15 DIAGNOSIS — N186 End stage renal disease: Secondary | ICD-10-CM | POA: Diagnosis not present

## 2015-03-16 DIAGNOSIS — Z4931 Encounter for adequacy testing for hemodialysis: Secondary | ICD-10-CM | POA: Diagnosis not present

## 2015-03-16 DIAGNOSIS — N186 End stage renal disease: Secondary | ICD-10-CM | POA: Diagnosis not present

## 2015-03-16 DIAGNOSIS — D509 Iron deficiency anemia, unspecified: Secondary | ICD-10-CM | POA: Diagnosis not present

## 2015-03-16 DIAGNOSIS — Z79899 Other long term (current) drug therapy: Secondary | ICD-10-CM | POA: Diagnosis not present

## 2015-03-16 DIAGNOSIS — D630 Anemia in neoplastic disease: Secondary | ICD-10-CM | POA: Diagnosis not present

## 2015-03-16 DIAGNOSIS — D631 Anemia in chronic kidney disease: Secondary | ICD-10-CM | POA: Diagnosis not present

## 2015-03-18 DIAGNOSIS — D509 Iron deficiency anemia, unspecified: Secondary | ICD-10-CM | POA: Diagnosis not present

## 2015-03-18 DIAGNOSIS — D631 Anemia in chronic kidney disease: Secondary | ICD-10-CM | POA: Diagnosis not present

## 2015-03-18 DIAGNOSIS — Z79899 Other long term (current) drug therapy: Secondary | ICD-10-CM | POA: Diagnosis not present

## 2015-03-18 DIAGNOSIS — Z4931 Encounter for adequacy testing for hemodialysis: Secondary | ICD-10-CM | POA: Diagnosis not present

## 2015-03-18 DIAGNOSIS — D630 Anemia in neoplastic disease: Secondary | ICD-10-CM | POA: Diagnosis not present

## 2015-03-18 DIAGNOSIS — N186 End stage renal disease: Secondary | ICD-10-CM | POA: Diagnosis not present

## 2015-03-20 ENCOUNTER — Ambulatory Visit (HOSPITAL_COMMUNITY)
Admission: RE | Admit: 2015-03-20 | Discharge: 2015-03-20 | Disposition: A | Discharge status: Home / Self Care | Payer: Medicare Other | Source: Ambulatory Visit | Attending: Vascular Surgery | Admitting: Vascular Surgery

## 2015-03-20 ENCOUNTER — Encounter (HOSPITAL_COMMUNITY): Payer: Self-pay | Admitting: Vascular Surgery

## 2015-03-20 ENCOUNTER — Encounter (HOSPITAL_COMMUNITY)
Admission: RE | Disposition: A | Discharge status: Home / Self Care | Payer: Self-pay | Source: Ambulatory Visit | Attending: Vascular Surgery

## 2015-03-20 DIAGNOSIS — Z79899 Other long term (current) drug therapy: Secondary | ICD-10-CM | POA: Diagnosis not present

## 2015-03-20 DIAGNOSIS — D509 Iron deficiency anemia, unspecified: Secondary | ICD-10-CM | POA: Diagnosis not present

## 2015-03-20 DIAGNOSIS — I2510 Atherosclerotic heart disease of native coronary artery without angina pectoris: Secondary | ICD-10-CM | POA: Insufficient documentation

## 2015-03-20 DIAGNOSIS — I120 Hypertensive chronic kidney disease with stage 5 chronic kidney disease or end stage renal disease: Secondary | ICD-10-CM | POA: Diagnosis not present

## 2015-03-20 DIAGNOSIS — D630 Anemia in neoplastic disease: Secondary | ICD-10-CM | POA: Diagnosis not present

## 2015-03-20 DIAGNOSIS — Z4931 Encounter for adequacy testing for hemodialysis: Secondary | ICD-10-CM | POA: Diagnosis not present

## 2015-03-20 DIAGNOSIS — I871 Compression of vein: Secondary | ICD-10-CM | POA: Diagnosis present

## 2015-03-20 DIAGNOSIS — T82898A Other specified complication of vascular prosthetic devices, implants and grafts, initial encounter: Secondary | ICD-10-CM | POA: Diagnosis not present

## 2015-03-20 DIAGNOSIS — D631 Anemia in chronic kidney disease: Secondary | ICD-10-CM | POA: Diagnosis not present

## 2015-03-20 DIAGNOSIS — N186 End stage renal disease: Secondary | ICD-10-CM | POA: Insufficient documentation

## 2015-03-20 HISTORY — PX: PERIPHERAL VASCULAR CATHETERIZATION: SHX172C

## 2015-03-20 LAB — POCT I-STAT, CHEM 8
BUN: 58 mg/dL — AB (ref 6–20)
CALCIUM ION: 1.04 mmol/L — AB (ref 1.12–1.23)
CHLORIDE: 97 mmol/L — AB (ref 101–111)
Creatinine, Ser: 14.2 mg/dL — ABNORMAL HIGH (ref 0.61–1.24)
Glucose, Bld: 88 mg/dL (ref 65–99)
HEMATOCRIT: 36 % — AB (ref 39.0–52.0)
Hemoglobin: 12.2 g/dL — ABNORMAL LOW (ref 13.0–17.0)
POTASSIUM: 3.9 mmol/L (ref 3.5–5.1)
SODIUM: 139 mmol/L (ref 135–145)
TCO2: 29 mmol/L (ref 0–100)

## 2015-03-20 SURGERY — A/V SHUNTOGRAM/FISTULAGRAM
Anesthesia: LOCAL

## 2015-03-20 MED ORDER — SODIUM CHLORIDE 0.9 % IJ SOLN: 3.0000 mL | INTRAMUSCULAR | Status: DC | PRN

## 2015-03-20 MED ORDER — LIDOCAINE HCL (PF) 1 % IJ SOLN
INTRAMUSCULAR | Status: AC
  Filled 2015-03-20: qty 30

## 2015-03-20 MED ORDER — SODIUM CHLORIDE 0.9 % IJ SOLN: 3.0000 mL | Freq: Two times a day (BID) | INTRAMUSCULAR | Status: DC

## 2015-03-20 MED ORDER — HEPARIN (PORCINE) IN NACL 2-0.9 UNIT/ML-% IJ SOLN
INTRAMUSCULAR | Status: DC | PRN
  Administered 2015-03-20: 08:00:00

## 2015-03-20 MED ORDER — ACETAMINOPHEN 325 MG PO TABS: 650.0000 mg | ORAL_TABLET | ORAL | Status: DC | PRN

## 2015-03-20 MED ORDER — IODIXANOL 320 MG/ML IV SOLN
INTRAVENOUS | Status: DC | PRN
  Administered 2015-03-20: 40 mL via INTRAVENOUS

## 2015-03-20 MED ORDER — SODIUM CHLORIDE 0.9 % IV SOLN: 250.0000 mL | INTRAVENOUS | Status: DC | PRN

## 2015-03-20 MED ORDER — HEPARIN (PORCINE) IN NACL 2-0.9 UNIT/ML-% IJ SOLN
INTRAMUSCULAR | Status: AC
  Filled 2015-03-20: qty 3000

## 2015-03-20 SURGICAL SUPPLY — 11 items
BAG SNAP BAND KOVER 36X36 (MISCELLANEOUS) ×2 IMPLANT
COVER DOME SNAP 22 D (MISCELLANEOUS) ×2 IMPLANT
COVER PRB 48X5XTLSCP FOLD TPE (BAG) ×1 IMPLANT
COVER PROBE 5X48 (BAG) ×1
KIT MICROINTRODUCER STIFF 5F (SHEATH) ×2 IMPLANT
PROTECTION STATION PRESSURIZED (MISCELLANEOUS) ×2
STATION PROTECTION PRESSURIZED (MISCELLANEOUS) ×1 IMPLANT
STOPCOCK MORSE 400PSI 3WAY (MISCELLANEOUS) ×2 IMPLANT
TRAY PV CATH (CUSTOM PROCEDURE TRAY) ×2 IMPLANT
TUBING CIL FLEX 10 FLL-RA (TUBING) ×2 IMPLANT
WIRE BENTSON .035X145CM (WIRE) ×2 IMPLANT

## 2015-03-20 NOTE — H&P (Signed)
Brief History and Physical  History of Present Illness  Christopher Ritter is a 44 y.o. male who presents with chief complaint: poor flow rates in L arm fistula.  The patient presents today for L arm fistulogram, possible intervention.    Past Medical History  Diagnosis Date  . Hypertension   . Chronic kidney disease   . Chronic headaches     posterior occipital headaches  . Heart murmur   . History of renal calculi   . Allergy   . Anemia     Iron deficiency  . Thyroid disease   . History of Cytoxan exposure   . GERD (gastroesophageal reflux disease)     hx of- no meds currently  . Coronary artery disease     Mitral Valve Prolapse- per Cardiologist Dr. Bettina Gavia  . Constipation     Past Surgical History  Procedure Laterality Date  . Av fistula placement    . Diatek placed and removed      UNTIL FISTULA MATURED  . Thrombectomy w/ embolectomy  07/08/2011    Procedure: THROMBECTOMY ARTERIOVENOUS FISTULA;  Surgeon: Mal Misty, MD;  Location: [facility];  Service: Vascular;  Laterality: Left;  Incision of Ulcerated Skin and Revision of Left Upper Arm Fistula  . Revison of arteriovenous fistula Left 11/28/2012    Procedure: REVISON OF ARTERIOVENOUS FISTULA;  Surgeon: Elam Dutch, MD;  Location: [facility] OR;  Service: Vascular;  Laterality: Left;  Plication     Social History   Social History  . Marital Status: Married    Spouse Name: N/A  . Number of Children: N/A  . Years of Education: N/A   Occupational History  . Not on file.   Social History Main Topics  . Smoking status: Never Smoker   . Smokeless tobacco: Never Used  . Alcohol Use: No  . Drug Use: No  . Sexual Activity: Not on file   Other Topics Concern  . Not on file   Social History Narrative    Family History  Problem Relation Age of Onset  . Anesthesia problems Neg Hx     No current facility-administered medications on file prior to encounter.   Current Outpatient Prescriptions on File Prior to  Encounter  Medication Sig Dispense Refill  . cetirizine (ZYRTEC) 10 MG tablet Take 10 mg by mouth daily as needed for allergies.     Marland Kitchen HYDROcodone-acetaminophen (NORCO) 10-325 MG per tablet Take 1 tablet by mouth every 4 (four) hours as needed for moderate pain.     . SENSIPAR 30 MG tablet Take 30 mg by mouth daily.     . simvastatin (ZOCOR) 20 MG tablet Take 20 mg by mouth at bedtime.     Marland Kitchen HYDROcodone-acetaminophen (NORCO) 5-325 MG per tablet Take 1 tablet by mouth every 6 (six) hours as needed for pain. (Patient not taking: Reported on 11/07/2014) 30 tablet 0  . oxyCODONE-acetaminophen (ROXICET) 5-325 MG per tablet Take 1 tablet by mouth every 4 (four) hours as needed for pain. (Patient not taking: Reported on 11/07/2014) 30 tablet 0  . promethazine (PHENERGAN) 25 MG tablet Take 25 mg by mouth every 8 (eight) hours as needed for nausea or vomiting.      Allergies  Allergen Reactions  . Lisinopril Cough    Review of Systems: As listed above, otherwise negative.  Physical Examination  Filed Vitals:   03/20/15 0543  BP: 123/83  Pulse: 104  Temp: 98.2 F (36.8 C)  TempSrc: Oral  Resp: 18  Height: 6\' 3"  (1.905 m)  Weight: 154 lb (69.854 kg)  SpO2: 100%    General: A&O x 3, WDWN  Pulmonary: Sym exp, good air movt, CTAB, no rales, rhonchi, & wheezing  Cardiac: RRR, Nl S1, S2, no Murmurs, rubs or gallops  Gastrointestinal: soft, NTND, -G/R, - HSM, - masses, - CVAT B  Musculoskeletal: M/S 5/5 throughout , Extremities without ischemic changes , pan-aneurysmal L BC AVF, pulsatile character, +wheezy bruit  Laboratory See iStat  Medical Decision Making  AMMIEL GUINEY is a 44 y.o. 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   The patient is scheduled for: L arm fistulogram, possible intervention  I discussed with the patient the nature of angiographic procedures, especially the limited patencies of any endovascular intervention.  The patient is aware of that  the risks of an angiographic procedure include but are not limited to: bleeding, infection, access site complications, renal failure, embolization, rupture of vessel, dissection, possible need for emergent surgical intervention, possible need for surgical procedures to treat the patient's pathology, and stroke and death.    The patient is aware of the risks and agrees to proceed.  Adele Barthel, MD [facility] Office: [phone] Pager: [phone]  03/20/2015, 7:25 AM

## 2015-03-20 NOTE — Discharge Instructions (Signed)
Fistulogram, Care After °Refer to this sheet in the next few weeks. These instructions provide you with information on caring for yourself after your procedure. Your health care provider may also give you more specific instructions. Your treatment has been planned according to current medical practices, but problems sometimes occur. Call your health care provider if you have any problems or questions after your procedure. °WHAT TO EXPECT AFTER THE PROCEDURE °After your procedure, it is typical to have the following: °· A small amount of discomfort in the area where the catheters were placed. °· A small amount of bruising around the fistula. °· Sleepiness and fatigue. °HOME CARE INSTRUCTIONS °· Rest at home for the day following your procedure. °· Do not drive or operate heavy machinery while taking pain medicine. °· Take medicines only as directed by your health care provider. °· Do not take baths, swim, or use a hot tub until your health care provider approves. You may shower 24 hours after the procedure or as directed by your health care provider. °· There are many different ways to close and cover an incision, including stitches, skin glue, and adhesive strips. Follow your health care provider's instructions on: °¨ Incision care. °¨ Bandage (dressing) changes and removal. °¨ Incision closure removal. °· Monitor your dialysis fistula carefully. °SEEK MEDICAL CARE IF: °· You have drainage, redness, swelling, or pain at your catheter site. °· You have a fever. °· You have chills. °SEEK IMMEDIATE MEDICAL CARE IF: °· You feel weak. °· You have trouble balancing. °· You have trouble moving your arms or legs. °· You have problems with your speech or vision. °· You can no longer feel a vibration or buzz when you put your fingers over your dialysis fistula. °· The limb that was used for the procedure: °¨ Swells. °¨ Is painful. °¨ Is cold. °¨ Is discolored, such as blue or pale white. °Document Released: 11/05/2013  Document Reviewed: 08/10/2013 °ExitCare® Patient Information ©2015 [employer]. This information is not intended to replace advice given to you by your health care provider. Make sure you discuss any questions you have with your health care provider. ° °

## 2015-03-22 DIAGNOSIS — Z4931 Encounter for adequacy testing for hemodialysis: Secondary | ICD-10-CM | POA: Diagnosis not present

## 2015-03-22 DIAGNOSIS — Z79899 Other long term (current) drug therapy: Secondary | ICD-10-CM | POA: Diagnosis not present

## 2015-03-22 DIAGNOSIS — D631 Anemia in chronic kidney disease: Secondary | ICD-10-CM | POA: Diagnosis not present

## 2015-03-22 DIAGNOSIS — D509 Iron deficiency anemia, unspecified: Secondary | ICD-10-CM | POA: Diagnosis not present

## 2015-03-22 DIAGNOSIS — N186 End stage renal disease: Secondary | ICD-10-CM | POA: Diagnosis not present

## 2015-03-22 DIAGNOSIS — D630 Anemia in neoplastic disease: Secondary | ICD-10-CM | POA: Diagnosis not present

## 2015-03-24 DIAGNOSIS — D631 Anemia in chronic kidney disease: Secondary | ICD-10-CM | POA: Diagnosis not present

## 2015-03-24 DIAGNOSIS — D630 Anemia in neoplastic disease: Secondary | ICD-10-CM | POA: Diagnosis not present

## 2015-03-24 DIAGNOSIS — N186 End stage renal disease: Secondary | ICD-10-CM | POA: Diagnosis not present

## 2015-03-24 DIAGNOSIS — Z4931 Encounter for adequacy testing for hemodialysis: Secondary | ICD-10-CM | POA: Diagnosis not present

## 2015-03-24 DIAGNOSIS — Z79899 Other long term (current) drug therapy: Secondary | ICD-10-CM | POA: Diagnosis not present

## 2015-03-24 DIAGNOSIS — D509 Iron deficiency anemia, unspecified: Secondary | ICD-10-CM | POA: Diagnosis not present

## 2015-03-25 DIAGNOSIS — D631 Anemia in chronic kidney disease: Secondary | ICD-10-CM | POA: Diagnosis not present

## 2015-03-25 DIAGNOSIS — D630 Anemia in neoplastic disease: Secondary | ICD-10-CM | POA: Diagnosis not present

## 2015-03-25 DIAGNOSIS — Z79899 Other long term (current) drug therapy: Secondary | ICD-10-CM | POA: Diagnosis not present

## 2015-03-25 DIAGNOSIS — D509 Iron deficiency anemia, unspecified: Secondary | ICD-10-CM | POA: Diagnosis not present

## 2015-03-25 DIAGNOSIS — N186 End stage renal disease: Secondary | ICD-10-CM | POA: Diagnosis not present

## 2015-03-25 DIAGNOSIS — Z4931 Encounter for adequacy testing for hemodialysis: Secondary | ICD-10-CM | POA: Diagnosis not present

## 2015-03-27 DIAGNOSIS — D631 Anemia in chronic kidney disease: Secondary | ICD-10-CM | POA: Diagnosis not present

## 2015-03-27 DIAGNOSIS — Z79899 Other long term (current) drug therapy: Secondary | ICD-10-CM | POA: Diagnosis not present

## 2015-03-27 DIAGNOSIS — D509 Iron deficiency anemia, unspecified: Secondary | ICD-10-CM | POA: Diagnosis not present

## 2015-03-27 DIAGNOSIS — N186 End stage renal disease: Secondary | ICD-10-CM | POA: Diagnosis not present

## 2015-03-27 DIAGNOSIS — Z4931 Encounter for adequacy testing for hemodialysis: Secondary | ICD-10-CM | POA: Diagnosis not present

## 2015-03-27 DIAGNOSIS — D630 Anemia in neoplastic disease: Secondary | ICD-10-CM | POA: Diagnosis not present

## 2015-03-29 DIAGNOSIS — D509 Iron deficiency anemia, unspecified: Secondary | ICD-10-CM | POA: Diagnosis not present

## 2015-03-29 DIAGNOSIS — Z79899 Other long term (current) drug therapy: Secondary | ICD-10-CM | POA: Diagnosis not present

## 2015-03-29 DIAGNOSIS — D630 Anemia in neoplastic disease: Secondary | ICD-10-CM | POA: Diagnosis not present

## 2015-03-29 DIAGNOSIS — D631 Anemia in chronic kidney disease: Secondary | ICD-10-CM | POA: Diagnosis not present

## 2015-03-29 DIAGNOSIS — N186 End stage renal disease: Secondary | ICD-10-CM | POA: Diagnosis not present

## 2015-03-29 DIAGNOSIS — Z4931 Encounter for adequacy testing for hemodialysis: Secondary | ICD-10-CM | POA: Diagnosis not present

## 2015-03-31 DIAGNOSIS — D630 Anemia in neoplastic disease: Secondary | ICD-10-CM | POA: Diagnosis not present

## 2015-03-31 DIAGNOSIS — Z4931 Encounter for adequacy testing for hemodialysis: Secondary | ICD-10-CM | POA: Diagnosis not present

## 2015-03-31 DIAGNOSIS — D631 Anemia in chronic kidney disease: Secondary | ICD-10-CM | POA: Diagnosis not present

## 2015-03-31 DIAGNOSIS — Z79899 Other long term (current) drug therapy: Secondary | ICD-10-CM | POA: Diagnosis not present

## 2015-03-31 DIAGNOSIS — N186 End stage renal disease: Secondary | ICD-10-CM | POA: Diagnosis not present

## 2015-03-31 DIAGNOSIS — D509 Iron deficiency anemia, unspecified: Secondary | ICD-10-CM | POA: Diagnosis not present

## 2015-04-01 DIAGNOSIS — Z4931 Encounter for adequacy testing for hemodialysis: Secondary | ICD-10-CM | POA: Diagnosis not present

## 2015-04-01 DIAGNOSIS — N186 End stage renal disease: Secondary | ICD-10-CM | POA: Diagnosis not present

## 2015-04-01 DIAGNOSIS — D509 Iron deficiency anemia, unspecified: Secondary | ICD-10-CM | POA: Diagnosis not present

## 2015-04-01 DIAGNOSIS — Z79899 Other long term (current) drug therapy: Secondary | ICD-10-CM | POA: Diagnosis not present

## 2015-04-01 DIAGNOSIS — D630 Anemia in neoplastic disease: Secondary | ICD-10-CM | POA: Diagnosis not present

## 2015-04-01 DIAGNOSIS — D631 Anemia in chronic kidney disease: Secondary | ICD-10-CM | POA: Diagnosis not present

## 2015-04-04 DIAGNOSIS — Z992 Dependence on renal dialysis: Secondary | ICD-10-CM | POA: Diagnosis not present

## 2015-04-04 DIAGNOSIS — D631 Anemia in chronic kidney disease: Secondary | ICD-10-CM | POA: Diagnosis not present

## 2015-04-04 DIAGNOSIS — D509 Iron deficiency anemia, unspecified: Secondary | ICD-10-CM | POA: Diagnosis not present

## 2015-04-04 DIAGNOSIS — N186 End stage renal disease: Secondary | ICD-10-CM | POA: Diagnosis not present

## 2015-04-04 DIAGNOSIS — N08 Glomerular disorders in diseases classified elsewhere: Secondary | ICD-10-CM | POA: Diagnosis not present

## 2015-04-04 DIAGNOSIS — D630 Anemia in neoplastic disease: Secondary | ICD-10-CM | POA: Diagnosis not present

## 2015-04-04 DIAGNOSIS — Z4931 Encounter for adequacy testing for hemodialysis: Secondary | ICD-10-CM | POA: Diagnosis not present

## 2015-04-04 DIAGNOSIS — Z79899 Other long term (current) drug therapy: Secondary | ICD-10-CM | POA: Diagnosis not present

## 2015-04-06 DIAGNOSIS — Z4931 Encounter for adequacy testing for hemodialysis: Secondary | ICD-10-CM | POA: Diagnosis not present

## 2015-04-06 DIAGNOSIS — D630 Anemia in neoplastic disease: Secondary | ICD-10-CM | POA: Diagnosis not present

## 2015-04-06 DIAGNOSIS — D509 Iron deficiency anemia, unspecified: Secondary | ICD-10-CM | POA: Diagnosis not present

## 2015-04-06 DIAGNOSIS — E440 Moderate protein-calorie malnutrition: Secondary | ICD-10-CM | POA: Diagnosis not present

## 2015-04-06 DIAGNOSIS — N2581 Secondary hyperparathyroidism of renal origin: Secondary | ICD-10-CM | POA: Diagnosis not present

## 2015-04-06 DIAGNOSIS — Z79899 Other long term (current) drug therapy: Secondary | ICD-10-CM | POA: Diagnosis not present

## 2015-04-06 DIAGNOSIS — N186 End stage renal disease: Secondary | ICD-10-CM | POA: Diagnosis not present

## 2015-04-06 DIAGNOSIS — N2589 Other disorders resulting from impaired renal tubular function: Secondary | ICD-10-CM | POA: Diagnosis not present

## 2015-04-07 DIAGNOSIS — E874 Mixed disorder of acid-base balance: Secondary | ICD-10-CM | POA: Diagnosis not present

## 2015-04-07 DIAGNOSIS — Z79899 Other long term (current) drug therapy: Secondary | ICD-10-CM | POA: Diagnosis not present

## 2015-04-07 DIAGNOSIS — D509 Iron deficiency anemia, unspecified: Secondary | ICD-10-CM | POA: Diagnosis not present

## 2015-04-07 DIAGNOSIS — N2581 Secondary hyperparathyroidism of renal origin: Secondary | ICD-10-CM | POA: Diagnosis not present

## 2015-04-07 DIAGNOSIS — Z4931 Encounter for adequacy testing for hemodialysis: Secondary | ICD-10-CM | POA: Diagnosis not present

## 2015-04-07 DIAGNOSIS — D630 Anemia in neoplastic disease: Secondary | ICD-10-CM | POA: Diagnosis not present

## 2015-04-07 DIAGNOSIS — N186 End stage renal disease: Secondary | ICD-10-CM | POA: Diagnosis not present

## 2015-04-09 DIAGNOSIS — Z4931 Encounter for adequacy testing for hemodialysis: Secondary | ICD-10-CM | POA: Diagnosis not present

## 2015-04-09 DIAGNOSIS — Z79899 Other long term (current) drug therapy: Secondary | ICD-10-CM | POA: Diagnosis not present

## 2015-04-09 DIAGNOSIS — D509 Iron deficiency anemia, unspecified: Secondary | ICD-10-CM | POA: Diagnosis not present

## 2015-04-09 DIAGNOSIS — N2581 Secondary hyperparathyroidism of renal origin: Secondary | ICD-10-CM | POA: Diagnosis not present

## 2015-04-09 DIAGNOSIS — N186 End stage renal disease: Secondary | ICD-10-CM | POA: Diagnosis not present

## 2015-04-09 DIAGNOSIS — D630 Anemia in neoplastic disease: Secondary | ICD-10-CM | POA: Diagnosis not present

## 2015-04-10 DIAGNOSIS — Z79891 Long term (current) use of opiate analgesic: Secondary | ICD-10-CM | POA: Diagnosis not present

## 2015-04-10 DIAGNOSIS — M87022 Idiopathic aseptic necrosis of left humerus: Secondary | ICD-10-CM | POA: Diagnosis not present

## 2015-04-10 DIAGNOSIS — M87021 Idiopathic aseptic necrosis of right humerus: Secondary | ICD-10-CM | POA: Diagnosis not present

## 2015-04-10 DIAGNOSIS — G894 Chronic pain syndrome: Secondary | ICD-10-CM | POA: Diagnosis not present

## 2015-04-11 DIAGNOSIS — Z4931 Encounter for adequacy testing for hemodialysis: Secondary | ICD-10-CM | POA: Diagnosis not present

## 2015-04-11 DIAGNOSIS — N2581 Secondary hyperparathyroidism of renal origin: Secondary | ICD-10-CM | POA: Diagnosis not present

## 2015-04-11 DIAGNOSIS — N186 End stage renal disease: Secondary | ICD-10-CM | POA: Diagnosis not present

## 2015-04-11 DIAGNOSIS — D630 Anemia in neoplastic disease: Secondary | ICD-10-CM | POA: Diagnosis not present

## 2015-04-11 DIAGNOSIS — Z79899 Other long term (current) drug therapy: Secondary | ICD-10-CM | POA: Diagnosis not present

## 2015-04-11 DIAGNOSIS — D509 Iron deficiency anemia, unspecified: Secondary | ICD-10-CM | POA: Diagnosis not present

## 2015-04-13 DIAGNOSIS — N186 End stage renal disease: Secondary | ICD-10-CM | POA: Diagnosis not present

## 2015-04-13 DIAGNOSIS — D630 Anemia in neoplastic disease: Secondary | ICD-10-CM | POA: Diagnosis not present

## 2015-04-13 DIAGNOSIS — N2581 Secondary hyperparathyroidism of renal origin: Secondary | ICD-10-CM | POA: Diagnosis not present

## 2015-04-13 DIAGNOSIS — Z79899 Other long term (current) drug therapy: Secondary | ICD-10-CM | POA: Diagnosis not present

## 2015-04-13 DIAGNOSIS — D509 Iron deficiency anemia, unspecified: Secondary | ICD-10-CM | POA: Diagnosis not present

## 2015-04-13 DIAGNOSIS — Z4931 Encounter for adequacy testing for hemodialysis: Secondary | ICD-10-CM | POA: Diagnosis not present

## 2015-04-15 DIAGNOSIS — Z4931 Encounter for adequacy testing for hemodialysis: Secondary | ICD-10-CM | POA: Diagnosis not present

## 2015-04-15 DIAGNOSIS — D509 Iron deficiency anemia, unspecified: Secondary | ICD-10-CM | POA: Diagnosis not present

## 2015-04-15 DIAGNOSIS — N186 End stage renal disease: Secondary | ICD-10-CM | POA: Diagnosis not present

## 2015-04-15 DIAGNOSIS — Z79899 Other long term (current) drug therapy: Secondary | ICD-10-CM | POA: Diagnosis not present

## 2015-04-15 DIAGNOSIS — D630 Anemia in neoplastic disease: Secondary | ICD-10-CM | POA: Diagnosis not present

## 2015-04-15 DIAGNOSIS — N2581 Secondary hyperparathyroidism of renal origin: Secondary | ICD-10-CM | POA: Diagnosis not present

## 2015-04-17 DIAGNOSIS — N186 End stage renal disease: Secondary | ICD-10-CM | POA: Diagnosis not present

## 2015-04-17 DIAGNOSIS — D630 Anemia in neoplastic disease: Secondary | ICD-10-CM | POA: Diagnosis not present

## 2015-04-17 DIAGNOSIS — Z79899 Other long term (current) drug therapy: Secondary | ICD-10-CM | POA: Diagnosis not present

## 2015-04-17 DIAGNOSIS — N2581 Secondary hyperparathyroidism of renal origin: Secondary | ICD-10-CM | POA: Diagnosis not present

## 2015-04-17 DIAGNOSIS — D509 Iron deficiency anemia, unspecified: Secondary | ICD-10-CM | POA: Diagnosis not present

## 2015-04-17 DIAGNOSIS — Z4931 Encounter for adequacy testing for hemodialysis: Secondary | ICD-10-CM | POA: Diagnosis not present

## 2015-04-18 DIAGNOSIS — N186 End stage renal disease: Secondary | ICD-10-CM | POA: Diagnosis not present

## 2015-04-18 DIAGNOSIS — Z79899 Other long term (current) drug therapy: Secondary | ICD-10-CM | POA: Diagnosis not present

## 2015-04-18 DIAGNOSIS — D509 Iron deficiency anemia, unspecified: Secondary | ICD-10-CM | POA: Diagnosis not present

## 2015-04-18 DIAGNOSIS — N2581 Secondary hyperparathyroidism of renal origin: Secondary | ICD-10-CM | POA: Diagnosis not present

## 2015-04-18 DIAGNOSIS — Z4931 Encounter for adequacy testing for hemodialysis: Secondary | ICD-10-CM | POA: Diagnosis not present

## 2015-04-18 DIAGNOSIS — D630 Anemia in neoplastic disease: Secondary | ICD-10-CM | POA: Diagnosis not present

## 2015-04-20 DIAGNOSIS — D630 Anemia in neoplastic disease: Secondary | ICD-10-CM | POA: Diagnosis not present

## 2015-04-20 DIAGNOSIS — D509 Iron deficiency anemia, unspecified: Secondary | ICD-10-CM | POA: Diagnosis not present

## 2015-04-20 DIAGNOSIS — Z79899 Other long term (current) drug therapy: Secondary | ICD-10-CM | POA: Diagnosis not present

## 2015-04-20 DIAGNOSIS — N186 End stage renal disease: Secondary | ICD-10-CM | POA: Diagnosis not present

## 2015-04-20 DIAGNOSIS — N2581 Secondary hyperparathyroidism of renal origin: Secondary | ICD-10-CM | POA: Diagnosis not present

## 2015-04-20 DIAGNOSIS — Z4931 Encounter for adequacy testing for hemodialysis: Secondary | ICD-10-CM | POA: Diagnosis not present

## 2015-04-21 DIAGNOSIS — D509 Iron deficiency anemia, unspecified: Secondary | ICD-10-CM | POA: Diagnosis not present

## 2015-04-21 DIAGNOSIS — N186 End stage renal disease: Secondary | ICD-10-CM | POA: Diagnosis not present

## 2015-04-21 DIAGNOSIS — Z4931 Encounter for adequacy testing for hemodialysis: Secondary | ICD-10-CM | POA: Diagnosis not present

## 2015-04-21 DIAGNOSIS — D630 Anemia in neoplastic disease: Secondary | ICD-10-CM | POA: Diagnosis not present

## 2015-04-21 DIAGNOSIS — N2581 Secondary hyperparathyroidism of renal origin: Secondary | ICD-10-CM | POA: Diagnosis not present

## 2015-04-21 DIAGNOSIS — Z79899 Other long term (current) drug therapy: Secondary | ICD-10-CM | POA: Diagnosis not present

## 2015-04-22 DIAGNOSIS — Z79899 Other long term (current) drug therapy: Secondary | ICD-10-CM | POA: Diagnosis not present

## 2015-04-22 DIAGNOSIS — N186 End stage renal disease: Secondary | ICD-10-CM | POA: Diagnosis not present

## 2015-04-22 DIAGNOSIS — D509 Iron deficiency anemia, unspecified: Secondary | ICD-10-CM | POA: Diagnosis not present

## 2015-04-22 DIAGNOSIS — Z4931 Encounter for adequacy testing for hemodialysis: Secondary | ICD-10-CM | POA: Diagnosis not present

## 2015-04-22 DIAGNOSIS — N2581 Secondary hyperparathyroidism of renal origin: Secondary | ICD-10-CM | POA: Diagnosis not present

## 2015-04-22 DIAGNOSIS — D630 Anemia in neoplastic disease: Secondary | ICD-10-CM | POA: Diagnosis not present

## 2015-04-24 DIAGNOSIS — Z4931 Encounter for adequacy testing for hemodialysis: Secondary | ICD-10-CM | POA: Diagnosis not present

## 2015-04-24 DIAGNOSIS — Z79899 Other long term (current) drug therapy: Secondary | ICD-10-CM | POA: Diagnosis not present

## 2015-04-24 DIAGNOSIS — D630 Anemia in neoplastic disease: Secondary | ICD-10-CM | POA: Diagnosis not present

## 2015-04-24 DIAGNOSIS — N186 End stage renal disease: Secondary | ICD-10-CM | POA: Diagnosis not present

## 2015-04-24 DIAGNOSIS — D509 Iron deficiency anemia, unspecified: Secondary | ICD-10-CM | POA: Diagnosis not present

## 2015-04-24 DIAGNOSIS — N2581 Secondary hyperparathyroidism of renal origin: Secondary | ICD-10-CM | POA: Diagnosis not present

## 2015-04-26 DIAGNOSIS — D630 Anemia in neoplastic disease: Secondary | ICD-10-CM | POA: Diagnosis not present

## 2015-04-26 DIAGNOSIS — Z4931 Encounter for adequacy testing for hemodialysis: Secondary | ICD-10-CM | POA: Diagnosis not present

## 2015-04-26 DIAGNOSIS — Z79899 Other long term (current) drug therapy: Secondary | ICD-10-CM | POA: Diagnosis not present

## 2015-04-26 DIAGNOSIS — N186 End stage renal disease: Secondary | ICD-10-CM | POA: Diagnosis not present

## 2015-04-26 DIAGNOSIS — N2581 Secondary hyperparathyroidism of renal origin: Secondary | ICD-10-CM | POA: Diagnosis not present

## 2015-04-26 DIAGNOSIS — D509 Iron deficiency anemia, unspecified: Secondary | ICD-10-CM | POA: Diagnosis not present

## 2015-04-28 DIAGNOSIS — D509 Iron deficiency anemia, unspecified: Secondary | ICD-10-CM | POA: Diagnosis not present

## 2015-04-28 DIAGNOSIS — Z4931 Encounter for adequacy testing for hemodialysis: Secondary | ICD-10-CM | POA: Diagnosis not present

## 2015-04-28 DIAGNOSIS — N2581 Secondary hyperparathyroidism of renal origin: Secondary | ICD-10-CM | POA: Diagnosis not present

## 2015-04-28 DIAGNOSIS — N186 End stage renal disease: Secondary | ICD-10-CM | POA: Diagnosis not present

## 2015-04-28 DIAGNOSIS — M87051 Idiopathic aseptic necrosis of right femur: Secondary | ICD-10-CM | POA: Diagnosis not present

## 2015-04-28 DIAGNOSIS — Z79899 Other long term (current) drug therapy: Secondary | ICD-10-CM | POA: Diagnosis not present

## 2015-04-28 DIAGNOSIS — D630 Anemia in neoplastic disease: Secondary | ICD-10-CM | POA: Diagnosis not present

## 2015-04-30 DIAGNOSIS — Z79899 Other long term (current) drug therapy: Secondary | ICD-10-CM | POA: Diagnosis not present

## 2015-04-30 DIAGNOSIS — D630 Anemia in neoplastic disease: Secondary | ICD-10-CM | POA: Diagnosis not present

## 2015-04-30 DIAGNOSIS — N2581 Secondary hyperparathyroidism of renal origin: Secondary | ICD-10-CM | POA: Diagnosis not present

## 2015-04-30 DIAGNOSIS — N186 End stage renal disease: Secondary | ICD-10-CM | POA: Diagnosis not present

## 2015-04-30 DIAGNOSIS — D509 Iron deficiency anemia, unspecified: Secondary | ICD-10-CM | POA: Diagnosis not present

## 2015-04-30 DIAGNOSIS — Z4931 Encounter for adequacy testing for hemodialysis: Secondary | ICD-10-CM | POA: Diagnosis not present

## 2015-05-02 DIAGNOSIS — D509 Iron deficiency anemia, unspecified: Secondary | ICD-10-CM | POA: Diagnosis not present

## 2015-05-02 DIAGNOSIS — N186 End stage renal disease: Secondary | ICD-10-CM | POA: Diagnosis not present

## 2015-05-02 DIAGNOSIS — N2581 Secondary hyperparathyroidism of renal origin: Secondary | ICD-10-CM | POA: Diagnosis not present

## 2015-05-02 DIAGNOSIS — Z79899 Other long term (current) drug therapy: Secondary | ICD-10-CM | POA: Diagnosis not present

## 2015-05-02 DIAGNOSIS — D630 Anemia in neoplastic disease: Secondary | ICD-10-CM | POA: Diagnosis not present

## 2015-05-02 DIAGNOSIS — Z4931 Encounter for adequacy testing for hemodialysis: Secondary | ICD-10-CM | POA: Diagnosis not present

## 2015-05-04 DIAGNOSIS — Z79899 Other long term (current) drug therapy: Secondary | ICD-10-CM | POA: Diagnosis not present

## 2015-05-04 DIAGNOSIS — Z4931 Encounter for adequacy testing for hemodialysis: Secondary | ICD-10-CM | POA: Diagnosis not present

## 2015-05-04 DIAGNOSIS — N2581 Secondary hyperparathyroidism of renal origin: Secondary | ICD-10-CM | POA: Diagnosis not present

## 2015-05-04 DIAGNOSIS — D509 Iron deficiency anemia, unspecified: Secondary | ICD-10-CM | POA: Diagnosis not present

## 2015-05-04 DIAGNOSIS — N186 End stage renal disease: Secondary | ICD-10-CM | POA: Diagnosis not present

## 2015-05-04 DIAGNOSIS — D630 Anemia in neoplastic disease: Secondary | ICD-10-CM | POA: Diagnosis not present

## 2015-05-05 ENCOUNTER — Telehealth: Payer: Self-pay

## 2015-05-05 DIAGNOSIS — Z992 Dependence on renal dialysis: Secondary | ICD-10-CM | POA: Diagnosis not present

## 2015-05-05 DIAGNOSIS — N08 Glomerular disorders in diseases classified elsewhere: Secondary | ICD-10-CM | POA: Diagnosis not present

## 2015-05-05 DIAGNOSIS — N186 End stage renal disease: Secondary | ICD-10-CM | POA: Diagnosis not present

## 2015-05-05 NOTE — Telephone Encounter (Signed)
Phone call from pt.  Requested an appt. to evaluate left arm AVF.  Reported episodes of bleeding "from both arterial and venous side", between treatments.  Reported he is dialyzing at home, and has episodes of bleeding that require direct pressure for a period of time, and can require prolonged pressure application to make the bleeding stop.  Stated his fistula is protruding more, now, and requests to have this evaluated.  Advised will discuss with MD in office, in Dr. Lianne Moris absence, and will contact him about an appt.  Agrees with plan.

## 2015-05-06 DIAGNOSIS — D509 Iron deficiency anemia, unspecified: Secondary | ICD-10-CM | POA: Diagnosis not present

## 2015-05-06 DIAGNOSIS — N186 End stage renal disease: Secondary | ICD-10-CM | POA: Diagnosis not present

## 2015-05-06 DIAGNOSIS — D630 Anemia in neoplastic disease: Secondary | ICD-10-CM | POA: Diagnosis not present

## 2015-05-07 NOTE — Telephone Encounter (Signed)
Rec'd response from Dr. Bridgett Larsson that based on Fistulogram, "there is no mechanical reason for the bleeding", and he suspected the problem "may be related to pt's cannulation technique."  Discussed need with Dr. Bridgett Larsson for a duplex of the access; responded that access duplex would not be helpful.  Stated "the fistulogram didn't really demonstrate a venous stenosis. He has a very tortuous fistula which might be increasing the resistance, but the fistula is pretty big."  Appt. given to pt. for evaluation on 05/16/15 with nurse practitioner, when Dr. Bridgett Larsson is in office, and available for consultation.  Pt. accepted appt. 11/11 @ 10:45 AM.  Advised to call office if concerns arise prior to appt.  Pt. stated he decreased the amt. of Heparin used, and didn't have any bleeding with last treatment.

## 2015-05-08 DIAGNOSIS — D509 Iron deficiency anemia, unspecified: Secondary | ICD-10-CM | POA: Diagnosis not present

## 2015-05-08 DIAGNOSIS — N186 End stage renal disease: Secondary | ICD-10-CM | POA: Diagnosis not present

## 2015-05-08 DIAGNOSIS — D630 Anemia in neoplastic disease: Secondary | ICD-10-CM | POA: Diagnosis not present

## 2015-05-11 DIAGNOSIS — D509 Iron deficiency anemia, unspecified: Secondary | ICD-10-CM | POA: Diagnosis not present

## 2015-05-11 DIAGNOSIS — D630 Anemia in neoplastic disease: Secondary | ICD-10-CM | POA: Diagnosis not present

## 2015-05-11 DIAGNOSIS — N186 End stage renal disease: Secondary | ICD-10-CM | POA: Diagnosis not present

## 2015-05-13 ENCOUNTER — Encounter: Payer: Self-pay | Admitting: Family

## 2015-05-13 DIAGNOSIS — D509 Iron deficiency anemia, unspecified: Secondary | ICD-10-CM | POA: Diagnosis not present

## 2015-05-13 DIAGNOSIS — D630 Anemia in neoplastic disease: Secondary | ICD-10-CM | POA: Diagnosis not present

## 2015-05-13 DIAGNOSIS — N186 End stage renal disease: Secondary | ICD-10-CM | POA: Diagnosis not present

## 2015-05-15 DIAGNOSIS — D509 Iron deficiency anemia, unspecified: Secondary | ICD-10-CM | POA: Diagnosis not present

## 2015-05-15 DIAGNOSIS — D630 Anemia in neoplastic disease: Secondary | ICD-10-CM | POA: Diagnosis not present

## 2015-05-15 DIAGNOSIS — N186 End stage renal disease: Secondary | ICD-10-CM | POA: Diagnosis not present

## 2015-05-16 ENCOUNTER — Encounter: Payer: Self-pay | Admitting: Vascular Surgery

## 2015-05-16 ENCOUNTER — Ambulatory Visit (INDEPENDENT_AMBULATORY_CARE_PROVIDER_SITE_OTHER): Payer: Self-pay | Admitting: Family

## 2015-05-16 ENCOUNTER — Encounter: Payer: Self-pay | Admitting: Family

## 2015-05-16 VITALS — BP 180/100 | HR 100 | Temp 99.4°F | Resp 14 | Ht 75.0 in | Wt 150.0 lb

## 2015-05-16 DIAGNOSIS — D630 Anemia in neoplastic disease: Secondary | ICD-10-CM | POA: Diagnosis not present

## 2015-05-16 DIAGNOSIS — D509 Iron deficiency anemia, unspecified: Secondary | ICD-10-CM | POA: Diagnosis not present

## 2015-05-16 DIAGNOSIS — N186 End stage renal disease: Secondary | ICD-10-CM

## 2015-05-16 DIAGNOSIS — Z992 Dependence on renal dialysis: Secondary | ICD-10-CM

## 2015-05-16 NOTE — Progress Notes (Signed)
Filed Vitals:   05/16/15 1100 05/16/15 1101  BP: 168/95 180/100  Pulse: 95 100  Temp: 99.4 F (37.4 C)   Resp: 14   Height: 6\' 3"  (1.905 m)   Weight: 150 lb (68.04 kg)   SpO2: 100%

## 2015-05-16 NOTE — Progress Notes (Signed)
Established Dialysis Access  History of Present Illness  Christopher Ritter is a 44 y.o. (1970/10/20) male patient of Dr. Oneida Alar who presents as an add-on today with chief complaint: clot retrieved after HD and bleeding between HD tx. He was seen in July 2016 by Dr, Bridgett Larsson for report of prolonged bleeding after HD. On 03/20/15 Dr. Bridgett Larsson performed a fistula gram. The patient has a left upper arm fistula that was created around four years ago. 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 He has not has any other accesses. He currently dialyzes at home on Mondays, Tuesdays, Thursdays, Fridays and Saturdays via a buttonhole method. He notes bleeding from these cannulation sites during and after dialysis. About June 2016, he was seen at [facility] and underwent a fistulogram with venoplasty. We do not have access to these records. On 11/07/14 he was seen by Dr. Oneida Alar for pseudoaneurysm evaluation.     Past Medical History  Diagnosis Date  . Hypertension   . Chronic kidney disease   . Chronic headaches     posterior occipital headaches  . Heart murmur   . History of renal calculi   . Allergy   . Anemia     Iron deficiency  . Thyroid disease   . History of Cytoxan exposure   . GERD (gastroesophageal reflux disease)     hx of- no meds currently  . Coronary artery disease     Mitral Valve Prolapse- per Cardiologist Dr. Bettina Gavia  . Constipation     Social History Social History  Substance Use Topics  . Smoking status: Never Smoker   . Smokeless tobacco: Never Used  . Alcohol Use: No    Family History Family History  Problem Relation Age of Onset  . Anesthesia problems Neg Hx     Surgical History Past Surgical History  Procedure Laterality Date  . Av fistula placement    . Diatek placed and removed      UNTIL FISTULA MATURED  . Thrombectomy w/ embolectomy  07/08/2011    Procedure: THROMBECTOMY ARTERIOVENOUS FISTULA;  Surgeon: Mal Misty, MD;  Location: [facility];  Service: Vascular;  Laterality: Left;  Incision of Ulcerated Skin and Revision of Left Upper Arm Fistula  . Revison of arteriovenous fistula Left 11/28/2012    Procedure: REVISON OF ARTERIOVENOUS FISTULA;  Surgeon: Elam Dutch, MD;  Location: [facility];  Service: Vascular;  Laterality: Left;  Plication   . Peripheral vascular catheterization N/A 03/20/2015    Procedure: A/V Fistulagram;  Surgeon: Conrad Schellsburg, MD;  Location: [facility];  Service: Cardiovascular;  Laterality: N/A;    Allergies  Allergen Reactions  . Lisinopril Cough    Current Outpatient Prescriptions  Medication Sig Dispense Refill  . calcitRIOL (ROCALTROL) 0.25 MCG capsule Take 0.25 mcg by mouth daily.    . calcium carbonate (TUMS EX) 750 MG chewable tablet Chew 2-3 tablets by mouth 3 (three) times daily with meals. Chew 3 tablets by mouth 3 times daily with meals and chew 2 tablets by mouth with snacks.    . cetirizine (ZYRTEC) 10 MG tablet Take 10 mg by mouth daily as needed for allergies.     Marland Kitchen docusate sodium (STOOL SOFTENER) 100 MG capsule Take 100 mg by mouth daily as needed for mild constipation.    Marland Kitchen HYDROcodone-acetaminophen (NORCO) 10-325 MG per tablet Take 1 tablet by mouth every 4 (four) hours as needed for moderate pain.     Marland Kitchen  promethazine (PHENERGAN) 25 MG tablet Take 25 mg by mouth every 8 (eight) hours as needed for nausea or vomiting.    . SENSIPAR 30 MG tablet Take 30 mg by mouth daily.     . simvastatin (ZOCOR) 20 MG tablet Take 20 mg by mouth at bedtime.      No current facility-administered medications for this visit.     REVIEW OF SYSTEMS: see HPI for pertinent positives and negatives    PHYSICAL EXAMINATION:  Filed Vitals:   05/16/15 1100 05/16/15 1101  BP: 168/95 180/100  Pulse: 95 100  Temp: 99.4 F (37.4 C)   Resp: 14   Height: 6\' 3"  (1.905 m)   Weight: 150 lb (68.04 kg)   SpO2: 100%    Body mass index is 18.75 kg/(m^2).  General: The  patient appears their stated age.   HEENT:  No gross abnormalities Pulmonary: Respirations are non-labored Musculoskeletal: There are no major deformities.   Neurologic: No focal weakness or paresthesias are detected Skin: There are no ulcer or rashes noted. Psychiatric: The patient has normal affect. Cardiovascular: There is a regular rate and rhythm without significant murmur appreciated. Left arm AVF is large with no one area of pronounced pseudoaneurysm, has a palpable thrill. Left radial pulse is 1+ palpable. Left hand is warm, left hand strength is 5/5. No bleeding from the AVF.   Medical Decision Making  [name] is a 44 y.o. male who presents with c/o bleeding between HD treatments and clot that was retrieved when heparin was not used.  Dr. Bridgett Larsson spoke with pt and wife and examined pt; cannulation technique discussed. Clot was related to not using heparin.  Follow up prn.  NICKEL, Sharmon Leyden, RN, MSN, FNP-C [facility] [place] Office: [phone]  05/16/2015, 10:46 AM  Clinic MD: Bridgett Larsson

## 2015-05-18 DIAGNOSIS — N186 End stage renal disease: Secondary | ICD-10-CM | POA: Diagnosis not present

## 2015-05-18 DIAGNOSIS — D509 Iron deficiency anemia, unspecified: Secondary | ICD-10-CM | POA: Diagnosis not present

## 2015-05-18 DIAGNOSIS — D630 Anemia in neoplastic disease: Secondary | ICD-10-CM | POA: Diagnosis not present

## 2015-05-21 DIAGNOSIS — D630 Anemia in neoplastic disease: Secondary | ICD-10-CM | POA: Diagnosis not present

## 2015-05-21 DIAGNOSIS — D509 Iron deficiency anemia, unspecified: Secondary | ICD-10-CM | POA: Diagnosis not present

## 2015-05-21 DIAGNOSIS — N186 End stage renal disease: Secondary | ICD-10-CM | POA: Diagnosis not present

## 2015-05-22 DIAGNOSIS — D630 Anemia in neoplastic disease: Secondary | ICD-10-CM | POA: Diagnosis not present

## 2015-05-22 DIAGNOSIS — N186 End stage renal disease: Secondary | ICD-10-CM | POA: Diagnosis not present

## 2015-05-22 DIAGNOSIS — D509 Iron deficiency anemia, unspecified: Secondary | ICD-10-CM | POA: Diagnosis not present

## 2015-05-24 DIAGNOSIS — D630 Anemia in neoplastic disease: Secondary | ICD-10-CM | POA: Diagnosis not present

## 2015-05-24 DIAGNOSIS — J069 Acute upper respiratory infection, unspecified: Secondary | ICD-10-CM | POA: Diagnosis not present

## 2015-05-24 DIAGNOSIS — J209 Acute bronchitis, unspecified: Secondary | ICD-10-CM | POA: Diagnosis not present

## 2015-05-24 DIAGNOSIS — N186 End stage renal disease: Secondary | ICD-10-CM | POA: Diagnosis not present

## 2015-05-24 DIAGNOSIS — D509 Iron deficiency anemia, unspecified: Secondary | ICD-10-CM | POA: Diagnosis not present

## 2015-05-24 DIAGNOSIS — N178 Other acute kidney failure: Secondary | ICD-10-CM | POA: Diagnosis not present

## 2015-05-26 DIAGNOSIS — D509 Iron deficiency anemia, unspecified: Secondary | ICD-10-CM | POA: Diagnosis not present

## 2015-05-26 DIAGNOSIS — D630 Anemia in neoplastic disease: Secondary | ICD-10-CM | POA: Diagnosis not present

## 2015-05-26 DIAGNOSIS — N186 End stage renal disease: Secondary | ICD-10-CM | POA: Diagnosis not present

## 2015-05-28 DIAGNOSIS — D630 Anemia in neoplastic disease: Secondary | ICD-10-CM | POA: Diagnosis not present

## 2015-05-28 DIAGNOSIS — N186 End stage renal disease: Secondary | ICD-10-CM | POA: Diagnosis not present

## 2015-05-28 DIAGNOSIS — D509 Iron deficiency anemia, unspecified: Secondary | ICD-10-CM | POA: Diagnosis not present

## 2015-05-31 DIAGNOSIS — N186 End stage renal disease: Secondary | ICD-10-CM | POA: Diagnosis not present

## 2015-05-31 DIAGNOSIS — D509 Iron deficiency anemia, unspecified: Secondary | ICD-10-CM | POA: Diagnosis not present

## 2015-05-31 DIAGNOSIS — D630 Anemia in neoplastic disease: Secondary | ICD-10-CM | POA: Diagnosis not present

## 2015-06-02 ENCOUNTER — Telehealth: Payer: Self-pay | Admitting: *Deleted

## 2015-06-02 DIAGNOSIS — D509 Iron deficiency anemia, unspecified: Secondary | ICD-10-CM | POA: Diagnosis not present

## 2015-06-02 DIAGNOSIS — D630 Anemia in neoplastic disease: Secondary | ICD-10-CM | POA: Diagnosis not present

## 2015-06-02 DIAGNOSIS — N186 End stage renal disease: Secondary | ICD-10-CM | POA: Diagnosis not present

## 2015-06-02 NOTE — Telephone Encounter (Signed)
Called patient back to discuss. He states that he has had this shiny area on his AVF for sometime now. He does home HD on various days of the week but his caregiver does not stick this "shiny area". He is not having any pain or abnormal bleeding in this area and is afebrile. He has had several revisions over the past few years and was last seen by out NP on 05-16-15. Patient states that this area was seen during that appt. Our office will call him and make an appt for evaluation.

## 2015-06-02 NOTE — Telephone Encounter (Signed)
-----   Message from Berniece Salines sent at 06/02/2015  3:17 PM EST ----- Regarding: FW: call patient   ----- Message -----    From: Gregery Na, RN    Sent: 06/02/2015   2:51 PM      To: Berniece Salines Subject: RE: call patient                               He will need to go to triage first. Thank you! ----- Message -----    From: Berniece Salines    Sent: 06/02/2015   2:29 PM      To: Gregery Na, RN Subject: call patient                                   This patient called regarding a shiny bump on his fistula - not painful, do you need to speak with him or should I make him an appointment with Dr. Bridgett Larsson? Thanks!

## 2015-06-03 DIAGNOSIS — D509 Iron deficiency anemia, unspecified: Secondary | ICD-10-CM | POA: Diagnosis not present

## 2015-06-03 DIAGNOSIS — N186 End stage renal disease: Secondary | ICD-10-CM | POA: Diagnosis not present

## 2015-06-03 DIAGNOSIS — J0100 Acute maxillary sinusitis, unspecified: Secondary | ICD-10-CM | POA: Diagnosis not present

## 2015-06-03 DIAGNOSIS — D630 Anemia in neoplastic disease: Secondary | ICD-10-CM | POA: Diagnosis not present

## 2015-06-03 DIAGNOSIS — J209 Acute bronchitis, unspecified: Secondary | ICD-10-CM | POA: Diagnosis not present

## 2015-06-04 DIAGNOSIS — Z992 Dependence on renal dialysis: Secondary | ICD-10-CM | POA: Diagnosis not present

## 2015-06-04 DIAGNOSIS — N08 Glomerular disorders in diseases classified elsewhere: Secondary | ICD-10-CM | POA: Diagnosis not present

## 2015-06-04 DIAGNOSIS — N186 End stage renal disease: Secondary | ICD-10-CM | POA: Diagnosis not present

## 2015-06-06 ENCOUNTER — Encounter: Payer: Self-pay | Admitting: Vascular Surgery

## 2015-06-07 DIAGNOSIS — D630 Anemia in neoplastic disease: Secondary | ICD-10-CM | POA: Diagnosis not present

## 2015-06-07 DIAGNOSIS — N2589 Other disorders resulting from impaired renal tubular function: Secondary | ICD-10-CM | POA: Diagnosis not present

## 2015-06-07 DIAGNOSIS — D509 Iron deficiency anemia, unspecified: Secondary | ICD-10-CM | POA: Diagnosis not present

## 2015-06-07 DIAGNOSIS — Z79899 Other long term (current) drug therapy: Secondary | ICD-10-CM | POA: Diagnosis not present

## 2015-06-07 DIAGNOSIS — N186 End stage renal disease: Secondary | ICD-10-CM | POA: Diagnosis not present

## 2015-06-09 ENCOUNTER — Telehealth: Payer: Self-pay

## 2015-06-09 ENCOUNTER — Encounter: Payer: Self-pay | Admitting: Vascular Surgery

## 2015-06-09 DIAGNOSIS — N2589 Other disorders resulting from impaired renal tubular function: Secondary | ICD-10-CM | POA: Diagnosis not present

## 2015-06-09 DIAGNOSIS — D630 Anemia in neoplastic disease: Secondary | ICD-10-CM | POA: Diagnosis not present

## 2015-06-09 DIAGNOSIS — M87051 Idiopathic aseptic necrosis of right femur: Secondary | ICD-10-CM | POA: Diagnosis not present

## 2015-06-09 DIAGNOSIS — Z79899 Other long term (current) drug therapy: Secondary | ICD-10-CM | POA: Diagnosis not present

## 2015-06-09 DIAGNOSIS — M25551 Pain in right hip: Secondary | ICD-10-CM | POA: Diagnosis not present

## 2015-06-09 DIAGNOSIS — N186 End stage renal disease: Secondary | ICD-10-CM | POA: Diagnosis not present

## 2015-06-09 DIAGNOSIS — D509 Iron deficiency anemia, unspecified: Secondary | ICD-10-CM | POA: Diagnosis not present

## 2015-06-09 NOTE — Telephone Encounter (Signed)
Phone call from [facility].  Requested to change pt's appt. from 12/9 to an earlier date.  Reported pt. does hemodialysis at home, and his wife reported a small, soft, raised area on AVF has become hard.  Stated his wife just reported he had a very bad treatment with low BP, nausea and vomiting, and sweating.  Denied any report of fever/ chills.  Advised pt's symptoms would not be related to the hard, raised area on AVF.  Advised will look at appt. options, to try to move appt. to earlier date for eval. of area on left UA AVF.  Agrees with plan.

## 2015-06-09 NOTE — Telephone Encounter (Signed)
Spoke with [name] at [facility] and patient, Christopher Ritter

## 2015-06-11 ENCOUNTER — Encounter: Payer: Self-pay | Admitting: Vascular Surgery

## 2015-06-11 ENCOUNTER — Ambulatory Visit (INDEPENDENT_AMBULATORY_CARE_PROVIDER_SITE_OTHER): Payer: Medicare Other | Admitting: Vascular Surgery

## 2015-06-11 VITALS — BP 163/102 | HR 89 | Temp 99.1°F | Resp 14 | Ht 75.0 in | Wt 153.0 lb

## 2015-06-11 DIAGNOSIS — T82590A Other mechanical complication of surgically created arteriovenous fistula, initial encounter: Secondary | ICD-10-CM

## 2015-06-11 NOTE — Progress Notes (Signed)
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 He has had 2 prior plication procedures in the distal and central portion of his fistula. He has had this fistula for several years. He usually dialyzes on Monday Wednesday and Friday. He does home dialysis. He denies any prior bleeding episodes. He states the area in question has developed end out shiny skin and has grown significantly over the last several weeks. He is not cannulating the fistula at this site. His only other chronic medical problem is hypertension which is currently controlled.  Review of systems: He denies chest pain. He denies shortness of breath.  Past Medical History  Diagnosis Date  . Hypertension   . Chronic kidney disease   . Chronic headaches     posterior occipital headaches  . Heart murmur   . History of renal calculi   . Allergy   . Anemia     Iron deficiency  . Thyroid disease   . History of Cytoxan exposure   . GERD (gastroesophageal reflux disease)     hx of- no meds currently  . Coronary artery disease     Mitral Valve Prolapse- per Cardiologist Dr. Bettina Gavia  . Constipation    Current Outpatient Prescriptions on File Prior to Visit  Medication Sig Dispense Refill  . calcitRIOL (ROCALTROL) 0.25 MCG capsule Take 0.25 mcg by mouth daily.    . calcium carbonate (TUMS EX) 750 MG chewable tablet Chew 2-3 tablets by mouth 3 (three) times daily with meals. Chew 3 tablets by mouth 3 times daily with meals and chew 2 tablets by mouth with snacks.    . cetirizine (ZYRTEC) 10 MG tablet Take 10 mg by mouth daily as needed for allergies.     Marland Kitchen docusate sodium (STOOL SOFTENER) 100 MG capsule Take 100 mg by mouth daily as needed for mild constipation.    Marland Kitchen HYDROcodone-acetaminophen (NORCO) 10-325 MG per tablet Take 1 tablet by mouth every 4 (four) hours as needed for moderate pain.     . promethazine (PHENERGAN) 25 MG tablet Take 25 mg by  mouth every 8 (eight) hours as needed for nausea or vomiting.    . SENSIPAR 30 MG tablet Take 30 mg by mouth daily.     . simvastatin (ZOCOR) 20 MG tablet Take 20 mg by mouth at bedtime.      No current facility-administered medications on file prior to visit.   Allergies  Allergen Reactions  . Lisinopril Cough    Physical exam:  Filed Vitals:   06/11/15 1031 06/11/15 1033  BP: 173/103 163/102  Pulse: 89 89  Temp: 99.1 F (37.3 C)   Resp: 14   Height: 6\' 3"  (1.905 m)   Weight: 153 lb (69.4 kg)   SpO2: 100%     Left upper extremity: Slightly pulsatile fistula 2 cm pseudoaneurysm proximal third of fistula laterally with slightly thinned out skin no eschar  Assessment: Growing pseudoaneurysm left arm AV fistula  Plan: Plication of left arm AV fistula when the next few weeks. Patient will call to schedule. Patient noted to be hypertensive on exam today he will follow-up with his nephrologist regarding this.  Ruta Hinds, MD [facility] Office: [phone] Pager: [phone]

## 2015-06-11 NOTE — Progress Notes (Signed)
Filed Vitals:   06/11/15 1031 06/11/15 1033  BP: 173/103 163/102  Pulse: 89 89  Temp: 99.1 F (37.3 C)   Resp: 14   Height: 6\' 3"  (1.905 m)   Weight: 153 lb (69.4 kg)   SpO2: 100%

## 2015-06-12 DIAGNOSIS — D630 Anemia in neoplastic disease: Secondary | ICD-10-CM | POA: Diagnosis not present

## 2015-06-12 DIAGNOSIS — Z79899 Other long term (current) drug therapy: Secondary | ICD-10-CM | POA: Diagnosis not present

## 2015-06-12 DIAGNOSIS — N2589 Other disorders resulting from impaired renal tubular function: Secondary | ICD-10-CM | POA: Diagnosis not present

## 2015-06-12 DIAGNOSIS — N186 End stage renal disease: Secondary | ICD-10-CM | POA: Diagnosis not present

## 2015-06-12 DIAGNOSIS — D509 Iron deficiency anemia, unspecified: Secondary | ICD-10-CM | POA: Diagnosis not present

## 2015-06-13 ENCOUNTER — Ambulatory Visit: Payer: Medicare Other | Admitting: Vascular Surgery

## 2015-06-13 ENCOUNTER — Other Ambulatory Visit: Payer: Self-pay

## 2015-06-14 DIAGNOSIS — N186 End stage renal disease: Secondary | ICD-10-CM | POA: Diagnosis not present

## 2015-06-14 DIAGNOSIS — N2589 Other disorders resulting from impaired renal tubular function: Secondary | ICD-10-CM | POA: Diagnosis not present

## 2015-06-14 DIAGNOSIS — D630 Anemia in neoplastic disease: Secondary | ICD-10-CM | POA: Diagnosis not present

## 2015-06-14 DIAGNOSIS — D509 Iron deficiency anemia, unspecified: Secondary | ICD-10-CM | POA: Diagnosis not present

## 2015-06-14 DIAGNOSIS — Z79899 Other long term (current) drug therapy: Secondary | ICD-10-CM | POA: Diagnosis not present

## 2015-06-17 DIAGNOSIS — D509 Iron deficiency anemia, unspecified: Secondary | ICD-10-CM | POA: Diagnosis not present

## 2015-06-17 DIAGNOSIS — D630 Anemia in neoplastic disease: Secondary | ICD-10-CM | POA: Diagnosis not present

## 2015-06-17 DIAGNOSIS — N2589 Other disorders resulting from impaired renal tubular function: Secondary | ICD-10-CM | POA: Diagnosis not present

## 2015-06-17 DIAGNOSIS — Z79899 Other long term (current) drug therapy: Secondary | ICD-10-CM | POA: Diagnosis not present

## 2015-06-17 DIAGNOSIS — N186 End stage renal disease: Secondary | ICD-10-CM | POA: Diagnosis not present

## 2015-06-18 ENCOUNTER — Ambulatory Visit: Payer: Medicare Other | Admitting: Vascular Surgery

## 2015-06-19 DIAGNOSIS — N186 End stage renal disease: Secondary | ICD-10-CM | POA: Diagnosis not present

## 2015-06-19 DIAGNOSIS — Z79899 Other long term (current) drug therapy: Secondary | ICD-10-CM | POA: Diagnosis not present

## 2015-06-19 DIAGNOSIS — N2589 Other disorders resulting from impaired renal tubular function: Secondary | ICD-10-CM | POA: Diagnosis not present

## 2015-06-19 DIAGNOSIS — D509 Iron deficiency anemia, unspecified: Secondary | ICD-10-CM | POA: Diagnosis not present

## 2015-06-19 DIAGNOSIS — D630 Anemia in neoplastic disease: Secondary | ICD-10-CM | POA: Diagnosis not present

## 2015-06-20 DIAGNOSIS — D509 Iron deficiency anemia, unspecified: Secondary | ICD-10-CM | POA: Diagnosis not present

## 2015-06-20 DIAGNOSIS — Z79899 Other long term (current) drug therapy: Secondary | ICD-10-CM | POA: Diagnosis not present

## 2015-06-20 DIAGNOSIS — N2589 Other disorders resulting from impaired renal tubular function: Secondary | ICD-10-CM | POA: Diagnosis not present

## 2015-06-20 DIAGNOSIS — D630 Anemia in neoplastic disease: Secondary | ICD-10-CM | POA: Diagnosis not present

## 2015-06-20 DIAGNOSIS — N186 End stage renal disease: Secondary | ICD-10-CM | POA: Diagnosis not present

## 2015-06-21 DIAGNOSIS — N186 End stage renal disease: Secondary | ICD-10-CM | POA: Diagnosis not present

## 2015-06-21 DIAGNOSIS — N2589 Other disorders resulting from impaired renal tubular function: Secondary | ICD-10-CM | POA: Diagnosis not present

## 2015-06-21 DIAGNOSIS — Z79899 Other long term (current) drug therapy: Secondary | ICD-10-CM | POA: Diagnosis not present

## 2015-06-21 DIAGNOSIS — D630 Anemia in neoplastic disease: Secondary | ICD-10-CM | POA: Diagnosis not present

## 2015-06-21 DIAGNOSIS — D509 Iron deficiency anemia, unspecified: Secondary | ICD-10-CM | POA: Diagnosis not present

## 2015-06-23 DIAGNOSIS — Z79899 Other long term (current) drug therapy: Secondary | ICD-10-CM | POA: Diagnosis not present

## 2015-06-23 DIAGNOSIS — N2589 Other disorders resulting from impaired renal tubular function: Secondary | ICD-10-CM | POA: Diagnosis not present

## 2015-06-23 DIAGNOSIS — D509 Iron deficiency anemia, unspecified: Secondary | ICD-10-CM | POA: Diagnosis not present

## 2015-06-23 DIAGNOSIS — N186 End stage renal disease: Secondary | ICD-10-CM | POA: Diagnosis not present

## 2015-06-23 DIAGNOSIS — D630 Anemia in neoplastic disease: Secondary | ICD-10-CM | POA: Diagnosis not present

## 2015-06-24 DIAGNOSIS — D630 Anemia in neoplastic disease: Secondary | ICD-10-CM | POA: Diagnosis not present

## 2015-06-24 DIAGNOSIS — D509 Iron deficiency anemia, unspecified: Secondary | ICD-10-CM | POA: Diagnosis not present

## 2015-06-24 DIAGNOSIS — N186 End stage renal disease: Secondary | ICD-10-CM | POA: Diagnosis not present

## 2015-06-24 DIAGNOSIS — Z79899 Other long term (current) drug therapy: Secondary | ICD-10-CM | POA: Diagnosis not present

## 2015-06-24 DIAGNOSIS — N2589 Other disorders resulting from impaired renal tubular function: Secondary | ICD-10-CM | POA: Diagnosis not present

## 2015-06-26 DIAGNOSIS — Z79899 Other long term (current) drug therapy: Secondary | ICD-10-CM | POA: Diagnosis not present

## 2015-06-26 DIAGNOSIS — D630 Anemia in neoplastic disease: Secondary | ICD-10-CM | POA: Diagnosis not present

## 2015-06-26 DIAGNOSIS — N186 End stage renal disease: Secondary | ICD-10-CM | POA: Diagnosis not present

## 2015-06-26 DIAGNOSIS — D509 Iron deficiency anemia, unspecified: Secondary | ICD-10-CM | POA: Diagnosis not present

## 2015-06-26 DIAGNOSIS — N2589 Other disorders resulting from impaired renal tubular function: Secondary | ICD-10-CM | POA: Diagnosis not present

## 2015-06-27 DIAGNOSIS — Z79899 Other long term (current) drug therapy: Secondary | ICD-10-CM | POA: Diagnosis not present

## 2015-06-27 DIAGNOSIS — N186 End stage renal disease: Secondary | ICD-10-CM | POA: Diagnosis not present

## 2015-06-27 DIAGNOSIS — N2589 Other disorders resulting from impaired renal tubular function: Secondary | ICD-10-CM | POA: Diagnosis not present

## 2015-06-27 DIAGNOSIS — D509 Iron deficiency anemia, unspecified: Secondary | ICD-10-CM | POA: Diagnosis not present

## 2015-06-27 DIAGNOSIS — D630 Anemia in neoplastic disease: Secondary | ICD-10-CM | POA: Diagnosis not present

## 2015-06-28 DIAGNOSIS — Z79899 Other long term (current) drug therapy: Secondary | ICD-10-CM | POA: Diagnosis not present

## 2015-06-28 DIAGNOSIS — D630 Anemia in neoplastic disease: Secondary | ICD-10-CM | POA: Diagnosis not present

## 2015-06-28 DIAGNOSIS — N2589 Other disorders resulting from impaired renal tubular function: Secondary | ICD-10-CM | POA: Diagnosis not present

## 2015-06-28 DIAGNOSIS — D509 Iron deficiency anemia, unspecified: Secondary | ICD-10-CM | POA: Diagnosis not present

## 2015-06-28 DIAGNOSIS — N186 End stage renal disease: Secondary | ICD-10-CM | POA: Diagnosis not present

## 2015-06-30 DIAGNOSIS — N186 End stage renal disease: Secondary | ICD-10-CM | POA: Diagnosis not present

## 2015-06-30 DIAGNOSIS — D509 Iron deficiency anemia, unspecified: Secondary | ICD-10-CM | POA: Diagnosis not present

## 2015-06-30 DIAGNOSIS — Z79899 Other long term (current) drug therapy: Secondary | ICD-10-CM | POA: Diagnosis not present

## 2015-06-30 DIAGNOSIS — N2589 Other disorders resulting from impaired renal tubular function: Secondary | ICD-10-CM | POA: Diagnosis not present

## 2015-06-30 DIAGNOSIS — D630 Anemia in neoplastic disease: Secondary | ICD-10-CM | POA: Diagnosis not present

## 2015-07-01 ENCOUNTER — Encounter (HOSPITAL_COMMUNITY): Payer: Self-pay | Admitting: *Deleted

## 2015-07-01 DIAGNOSIS — N186 End stage renal disease: Secondary | ICD-10-CM | POA: Diagnosis not present

## 2015-07-01 DIAGNOSIS — N2589 Other disorders resulting from impaired renal tubular function: Secondary | ICD-10-CM | POA: Diagnosis not present

## 2015-07-01 DIAGNOSIS — Z79899 Other long term (current) drug therapy: Secondary | ICD-10-CM | POA: Diagnosis not present

## 2015-07-01 DIAGNOSIS — D509 Iron deficiency anemia, unspecified: Secondary | ICD-10-CM | POA: Diagnosis not present

## 2015-07-01 DIAGNOSIS — D630 Anemia in neoplastic disease: Secondary | ICD-10-CM | POA: Diagnosis not present

## 2015-07-01 MED ORDER — DEXTROSE 5 % IV SOLN
1.5000 g | INTRAVENOUS | Status: AC
  Administered 2015-07-02: 1.5 g via INTRAVENOUS
  Filled 2015-07-01: qty 1.5

## 2015-07-01 MED ORDER — SODIUM CHLORIDE 0.9 % IV SOLN
INTRAVENOUS | Status: DC
  Administered 2015-07-02: 08:00:00 via INTRAVENOUS

## 2015-07-01 MED ORDER — CHLORHEXIDINE GLUCONATE CLOTH 2 % EX PADS: 6.0000 | MEDICATED_PAD | Freq: Once | CUTANEOUS | Status: DC

## 2015-07-02 ENCOUNTER — Encounter (HOSPITAL_COMMUNITY)
Admission: RE | Disposition: A | Discharge status: Home / Self Care | Payer: Self-pay | Source: Ambulatory Visit | Attending: Vascular Surgery

## 2015-07-02 ENCOUNTER — Ambulatory Visit (HOSPITAL_COMMUNITY): Payer: Medicare Other | Admitting: Anesthesiology

## 2015-07-02 ENCOUNTER — Ambulatory Visit (HOSPITAL_COMMUNITY)
Admission: RE | Admit: 2015-07-02 | Discharge: 2015-07-02 | Disposition: A | Discharge status: Home / Self Care | Payer: Medicare Other | Source: Ambulatory Visit | Attending: Vascular Surgery | Admitting: Vascular Surgery

## 2015-07-02 ENCOUNTER — Encounter (HOSPITAL_COMMUNITY): Payer: Self-pay | Admitting: *Deleted

## 2015-07-02 DIAGNOSIS — Y832 Surgical operation with anastomosis, bypass or graft as the cause of abnormal reaction of the patient, or of later complication, without mention of misadventure at the time of the procedure: Secondary | ICD-10-CM | POA: Insufficient documentation

## 2015-07-02 DIAGNOSIS — I120 Hypertensive chronic kidney disease with stage 5 chronic kidney disease or end stage renal disease: Secondary | ICD-10-CM | POA: Diagnosis not present

## 2015-07-02 DIAGNOSIS — I2510 Atherosclerotic heart disease of native coronary artery without angina pectoris: Secondary | ICD-10-CM | POA: Insufficient documentation

## 2015-07-02 DIAGNOSIS — D509 Iron deficiency anemia, unspecified: Secondary | ICD-10-CM | POA: Diagnosis not present

## 2015-07-02 DIAGNOSIS — K219 Gastro-esophageal reflux disease without esophagitis: Secondary | ICD-10-CM | POA: Diagnosis not present

## 2015-07-02 DIAGNOSIS — T82898A Other specified complication of vascular prosthetic devices, implants and grafts, initial encounter: Secondary | ICD-10-CM | POA: Insufficient documentation

## 2015-07-02 DIAGNOSIS — I729 Aneurysm of unspecified site: Secondary | ICD-10-CM | POA: Diagnosis not present

## 2015-07-02 DIAGNOSIS — Z79899 Other long term (current) drug therapy: Secondary | ICD-10-CM | POA: Diagnosis not present

## 2015-07-02 DIAGNOSIS — N186 End stage renal disease: Secondary | ICD-10-CM | POA: Insufficient documentation

## 2015-07-02 DIAGNOSIS — Z992 Dependence on renal dialysis: Secondary | ICD-10-CM | POA: Insufficient documentation

## 2015-07-02 DIAGNOSIS — N189 Chronic kidney disease, unspecified: Secondary | ICD-10-CM | POA: Diagnosis not present

## 2015-07-02 HISTORY — DX: Reserved for inherently not codable concepts without codable children: IMO0001

## 2015-07-02 HISTORY — PX: REVISON OF ARTERIOVENOUS FISTULA: SHX6074

## 2015-07-02 LAB — POCT I-STAT 4, (NA,K, GLUC, HGB,HCT)
GLUCOSE: 88 mg/dL (ref 65–99)
HEMATOCRIT: 35 % — AB (ref 39.0–52.0)
HEMOGLOBIN: 11.9 g/dL — AB (ref 13.0–17.0)
POTASSIUM: 4.1 mmol/L (ref 3.5–5.1)
Sodium: 138 mmol/L (ref 135–145)

## 2015-07-02 SURGERY — REVISON OF ARTERIOVENOUS FISTULA
Anesthesia: General | Site: Arm Upper | Laterality: Left

## 2015-07-02 MED ORDER — ACETAMINOPHEN 160 MG/5ML PO SOLN
325.0000 mg | ORAL | Status: DC | PRN
  Filled 2015-07-02: qty 20.3

## 2015-07-02 MED ORDER — 0.9 % SODIUM CHLORIDE (POUR BTL) OPTIME
TOPICAL | Status: DC | PRN
  Administered 2015-07-02: 1000 mL

## 2015-07-02 MED ORDER — SODIUM CHLORIDE 0.9 % IV SOLN
INTRAVENOUS | Status: DC | PRN
  Administered 2015-07-02: 08:00:00

## 2015-07-02 MED ORDER — SODIUM CHLORIDE 0.9 % IJ SOLN
INTRAMUSCULAR | Status: AC
  Filled 2015-07-02: qty 10

## 2015-07-02 MED ORDER — PROPOFOL 10 MG/ML IV BOLUS
INTRAVENOUS | Status: DC | PRN
  Administered 2015-07-02: 200 mg via INTRAVENOUS

## 2015-07-02 MED ORDER — SUCCINYLCHOLINE CHLORIDE 20 MG/ML IJ SOLN
INTRAMUSCULAR | Status: AC
  Filled 2015-07-02: qty 1

## 2015-07-02 MED ORDER — ACETAMINOPHEN 325 MG PO TABS: 325.0000 mg | ORAL_TABLET | ORAL | Status: DC | PRN

## 2015-07-02 MED ORDER — FENTANYL CITRATE (PF) 100 MCG/2ML IJ SOLN
25.0000 ug | INTRAMUSCULAR | Status: DC | PRN
  Administered 2015-07-02 (×4): 25 ug via INTRAVENOUS

## 2015-07-02 MED ORDER — HYDROCODONE-ACETAMINOPHEN 10-325 MG PO TABS: 1.0000 | ORAL_TABLET | ORAL | Status: AC | PRN

## 2015-07-02 MED ORDER — HEPARIN SODIUM (PORCINE) 1000 UNIT/ML IJ SOLN
INTRAMUSCULAR | Status: AC
  Filled 2015-07-02: qty 1

## 2015-07-02 MED ORDER — MIDAZOLAM HCL 2 MG/2ML IJ SOLN
INTRAMUSCULAR | Status: AC
  Filled 2015-07-02: qty 2

## 2015-07-02 MED ORDER — LIDOCAINE HCL (PF) 1 % IJ SOLN
INTRAMUSCULAR | Status: AC
  Filled 2015-07-02: qty 30

## 2015-07-02 MED ORDER — HEPARIN SODIUM (PORCINE) 1000 UNIT/ML IJ SOLN
INTRAMUSCULAR | Status: DC | PRN
  Administered 2015-07-02: 10000 [IU] via INTRAVENOUS

## 2015-07-02 MED ORDER — PROTAMINE SULFATE 10 MG/ML IV SOLN
INTRAVENOUS | Status: AC
  Filled 2015-07-02: qty 5

## 2015-07-02 MED ORDER — FENTANYL CITRATE (PF) 250 MCG/5ML IJ SOLN
INTRAMUSCULAR | Status: AC
  Filled 2015-07-02: qty 5

## 2015-07-02 MED ORDER — OXYCODONE HCL 5 MG PO TABS: 5.0000 mg | ORAL_TABLET | Freq: Once | ORAL | Status: DC | PRN

## 2015-07-02 MED ORDER — FENTANYL CITRATE (PF) 100 MCG/2ML IJ SOLN
INTRAMUSCULAR | Status: AC
  Filled 2015-07-02: qty 2

## 2015-07-02 MED ORDER — ROCURONIUM BROMIDE 50 MG/5ML IV SOLN
INTRAVENOUS | Status: AC
  Filled 2015-07-02: qty 1

## 2015-07-02 MED ORDER — LIDOCAINE HCL (CARDIAC) 20 MG/ML IV SOLN
INTRAVENOUS | Status: DC | PRN
  Administered 2015-07-02: 70 mg via INTRATRACHEAL

## 2015-07-02 MED ORDER — HEMOSTATIC AGENTS (NO CHARGE) OPTIME
TOPICAL | Status: DC | PRN
  Administered 2015-07-02: 1 via TOPICAL

## 2015-07-02 MED ORDER — LIDOCAINE HCL (CARDIAC) 20 MG/ML IV SOLN
INTRAVENOUS | Status: AC
  Filled 2015-07-02: qty 5

## 2015-07-02 MED ORDER — OXYCODONE HCL 5 MG/5ML PO SOLN: 5.0000 mg | Freq: Once | ORAL | Status: DC | PRN

## 2015-07-02 MED ORDER — ONDANSETRON HCL 4 MG/2ML IJ SOLN
INTRAMUSCULAR | Status: AC
  Filled 2015-07-02: qty 2

## 2015-07-02 MED ORDER — ONDANSETRON HCL 4 MG/2ML IJ SOLN
INTRAMUSCULAR | Status: DC | PRN
  Administered 2015-07-02: 4 mg via INTRAVENOUS

## 2015-07-02 MED ORDER — GLYCOPYRROLATE 0.2 MG/ML IJ SOLN
INTRAMUSCULAR | Status: AC
  Filled 2015-07-02: qty 1

## 2015-07-02 MED ORDER — PROTAMINE SULFATE 10 MG/ML IV SOLN
INTRAVENOUS | Status: DC | PRN
  Administered 2015-07-02: 50 mg via INTRAVENOUS

## 2015-07-02 MED ORDER — FENTANYL CITRATE (PF) 100 MCG/2ML IJ SOLN
INTRAMUSCULAR | Status: DC | PRN
  Administered 2015-07-02 (×3): 50 ug via INTRAVENOUS

## 2015-07-02 MED ORDER — MIDAZOLAM HCL 5 MG/5ML IJ SOLN
INTRAMUSCULAR | Status: DC | PRN
  Administered 2015-07-02: 2 mg via INTRAVENOUS

## 2015-07-02 MED ORDER — PROPOFOL 10 MG/ML IV BOLUS
INTRAVENOUS | Status: AC
  Filled 2015-07-02: qty 40

## 2015-07-02 MED ORDER — EPHEDRINE SULFATE 50 MG/ML IJ SOLN
INTRAMUSCULAR | Status: AC
  Filled 2015-07-02: qty 1

## 2015-07-02 SURGICAL SUPPLY — 40 items
BENZOIN TINCTURE PRP APPL 2/3 (GAUZE/BANDAGES/DRESSINGS) ×2 IMPLANT
BNDG ESMARK 4X9 LF (GAUZE/BANDAGES/DRESSINGS) ×2 IMPLANT
CANISTER SUCTION 2500CC (MISCELLANEOUS) ×2 IMPLANT
CLIP TI MEDIUM 6 (CLIP) ×2 IMPLANT
CLIP TI WIDE RED SMALL 6 (CLIP) ×2 IMPLANT
CUFF TOURNIQUET SINGLE 18IN (TOURNIQUET CUFF) ×2 IMPLANT
DECANTER SPIKE VIAL GLASS SM (MISCELLANEOUS) ×2 IMPLANT
ELECT REM PT RETURN 9FT ADLT (ELECTROSURGICAL) ×2
ELECTRODE REM PT RTRN 9FT ADLT (ELECTROSURGICAL) ×1 IMPLANT
GAUZE SPONGE 2X2 8PLY STRL LF (GAUZE/BANDAGES/DRESSINGS) ×1 IMPLANT
GLOVE BIO SURGEON STRL SZ 6.5 (GLOVE) ×2 IMPLANT
GLOVE BIO SURGEON STRL SZ7 (GLOVE) ×2 IMPLANT
GLOVE BIOGEL PI IND STRL 6.5 (GLOVE) ×3 IMPLANT
GLOVE BIOGEL PI IND STRL 7.0 (GLOVE) ×1 IMPLANT
GLOVE BIOGEL PI IND STRL 7.5 (GLOVE) ×1 IMPLANT
GLOVE BIOGEL PI INDICATOR 6.5 (GLOVE) ×3
GLOVE BIOGEL PI INDICATOR 7.0 (GLOVE) ×1
GLOVE BIOGEL PI INDICATOR 7.5 (GLOVE) ×1
GLOVE SS BIOGEL STRL SZ 6.5 (GLOVE) ×1 IMPLANT
GLOVE SUPERSENSE BIOGEL SZ 6.5 (GLOVE) ×1
GOWN STRL REUS W/ TWL LRG LVL3 (GOWN DISPOSABLE) ×3 IMPLANT
GOWN STRL REUS W/TWL LRG LVL3 (GOWN DISPOSABLE) ×3
HEMOSTAT SPONGE AVITENE ULTRA (HEMOSTASIS) ×2 IMPLANT
KIT BASIN OR (CUSTOM PROCEDURE TRAY) ×2 IMPLANT
KIT ROOM TURNOVER OR (KITS) ×2 IMPLANT
LIQUID BAND (GAUZE/BANDAGES/DRESSINGS) IMPLANT
NS IRRIG 1000ML POUR BTL (IV SOLUTION) ×2 IMPLANT
PACK CV ACCESS (CUSTOM PROCEDURE TRAY) ×2 IMPLANT
PAD ARMBOARD 7.5X6 YLW CONV (MISCELLANEOUS) ×4 IMPLANT
SPONGE GAUZE 2X2 STER 10/PKG (GAUZE/BANDAGES/DRESSINGS) ×1
STRIP CLOSURE SKIN 1/2X4 (GAUZE/BANDAGES/DRESSINGS) ×2 IMPLANT
SUT MNCRL AB 4-0 PS2 18 (SUTURE) ×2 IMPLANT
SUT PROLENE 5 0 C 1 24 (SUTURE) ×2 IMPLANT
SUT PROLENE 6 0 BV (SUTURE) ×2 IMPLANT
SUT VIC AB 3-0 SH 27 (SUTURE) ×1
SUT VIC AB 3-0 SH 27X BRD (SUTURE) ×1 IMPLANT
TAPE CLOTH SURG 4X10 WHT LF (GAUZE/BANDAGES/DRESSINGS) ×2 IMPLANT
TOWEL OR 17X24 6PK STRL BLUE (TOWEL DISPOSABLE) ×2 IMPLANT
UNDERPAD 30X30 INCONTINENT (UNDERPADS AND DIAPERS) ×2 IMPLANT
WATER STERILE IRR 1000ML POUR (IV SOLUTION) ×2 IMPLANT

## 2015-07-02 NOTE — Op Note (Signed)
    OPERATIVE NOTE   PROCEDURE: 1. Plication of left upper arm fistula pseudoaneurysm  PRE-OPERATIVE DIAGNOSIS: Left upper arm fistula pseudoaneurysm with concern for imminent rupture  POST-OPERATIVE DIAGNOSIS: same  SURGEON: Adele Barthel, MD  ASSISTANT(S): Silva Bandy, PAC   ANESTHESIA: general  ESTIMATED BLOOD LOSS: 50 cc  FINDING(S): 1.  Pseudoaneurysm attenuated skin overlying 2.  No signs of infection 3.  Palpable thrill in fistula  SPECIMEN(S):  none  INDICATIONS:   Christopher Ritter is a 44 y.o. male who presents with spontaneously appearing outpouching on upper arm fistula which reportedly was growing in size.  Dr. Oneida Alar felt that the pseudoaneurysmal segment needed to be plicated to salvage the left upper arm fistula and prevent potentially life threatening rupture.  Risk, benefits, and alternatives to plication of the upper arm fistula were discussed.  The patient is aware the risks include but are not limited to: bleeding, infection, steal syndrome, nerve damage, ischemic monomelic neuropathy, thrombosis, need for additional procedures, death and stroke.  The patient agrees to proceed forward with the procedure. .  DESCRIPTION: After obtaining full informed written consent, the patient was brought back to the operating room and placed supine upon the operating table.  The patient received IV antibiotics prior to induction.  After obtaining adequate anesthesia, the patient was prepped and draped in the standard fashion for: left arm access.  I placed a sterile tourniquet on the upper arm.  I then gave 9000 units of Heparin intravenously for full anticoagulation.  After waiting 3 minutes, I exsanguinated the left arm with an Esmark bandage.  I made an ellipitical incision around the pseudoaneurysm in the mid-segment.  I dissected down to the fistula with electrocautery, entering into the fistula.  This was essentially a button sized focus defect in fistula wall with attenuated skin  overlying the pseudoaneurysm.  I repaired the fistula wall with a double layer of 5-0 Prolene.  I repaired the subcutaneous tissue with a running stitch of 3-0 Vicryl.  The skin was repaired with a running subcuticular layer of 4-0 Monocryl.  The skin was cleaned, dried, and reinforced with Dermabond.  There remained a strong thrill in the fistula   COMPLICATIONS: none  CONDITION: stable   Adele Barthel, MD [facility] Office: [phone] Pager: [phone]  07/02/2015, 9:26 AM

## 2015-07-02 NOTE — Addendum Note (Signed)
Addendum  created 07/02/15 1159 by Scheryl Darter, CRNA   Modules edited: Anesthesia Medication Administration

## 2015-07-02 NOTE — Anesthesia Preprocedure Evaluation (Signed)
Anesthesia Evaluation  Patient identified by MRN, date of birth, ID band Patient awake    Reviewed: Allergy & Precautions, NPO status , Patient's Chart, lab work & pertinent test results  History of Anesthesia Complications Negative for: history of anesthetic complications  Airway Mallampati: II  TM Distance: >3 FB Neck ROM: Full    Dental  (+) Teeth Intact   Pulmonary neg pulmonary ROS,    breath sounds clear to auscultation       Cardiovascular hypertension, Pt. on medications (-) angina(-) Past MI and (-) CHF + Valvular Problems/Murmurs MR  Rhythm:Regular + Systolic murmurs    Neuro/Psych  Headaches, neg Seizures negative psych ROS   GI/Hepatic Neg liver ROS, GERD  Medicated and Controlled,  Endo/Other  negative endocrine ROS  Renal/GU ESRF and DialysisRenal disease     Musculoskeletal   Abdominal   Peds  Hematology  (+) anemia ,   Anesthesia Other Findings   Reproductive/Obstetrics                             Anesthesia Physical Anesthesia Plan  ASA: III  Anesthesia Plan: General   Post-op Pain Management:    Induction: Intravenous  Airway Management Planned: LMA  Additional Equipment: None  Intra-op Plan:   Post-operative Plan: Extubation in OR  Informed Consent: I have reviewed the patients History and Physical, chart, labs and discussed the procedure including the risks, benefits and alternatives for the proposed anesthesia with the patient or authorized representative who has indicated his/her understanding and acceptance.   Dental advisory given  Plan Discussed with: CRNA and Surgeon  Anesthesia Plan Comments:         Anesthesia Quick Evaluation

## 2015-07-02 NOTE — Discharge Instructions (Signed)
° ° °  07/02/2015 Christopher Ritter [id number] 1970-12-01  Surgeon(s): Conrad , MD  Procedure(s): Pseudoaneurysm Repair of Left Upper Arm Fistula  x May stick graft on designated area only:

## 2015-07-02 NOTE — Transfer of Care (Signed)
Immediate Anesthesia Transfer of Care Note  Patient: Christopher Ritter  Procedure(s) Performed: Procedure(s): Pseudoaneurysm Repair of Left Upper Arm Fistula (Left)  Patient Location: PACU  Anesthesia Type:General  Level of Consciousness: awake, alert , oriented and sedated  Airway & Oxygen Therapy: Patient Spontanous Breathing  Post-op Assessment: Report given to RN, Post -op Vital signs reviewed and stable and Patient moving all extremities  Post vital signs: Reviewed and stable  Last Vitals:  Filed Vitals:   07/02/15 0702  BP: 154/98  Pulse: 87  Temp: 36.6 C  Resp: 16    Complications: No apparent anesthesia complications

## 2015-07-02 NOTE — Anesthesia Procedure Notes (Signed)
Procedure Name: LMA Insertion Date/Time: 07/02/2015 8:37 AM Performed by: Scheryl Darter Pre-anesthesia Checklist: Patient identified, Emergency Drugs available, Suction available, Patient being monitored and Timeout performed Patient Re-evaluated:Patient Re-evaluated prior to inductionOxygen Delivery Method: Circle system utilized Preoxygenation: Pre-oxygenation with 100% oxygen Intubation Type: IV induction Ventilation: Mask ventilation without difficulty LMA: LMA inserted LMA Size: 4.0 Number of attempts: 1 Placement Confirmation: positive ETCO2 and breath sounds checked- equal and bilateral Tube secured with: Tape Dental Injury: Teeth and Oropharynx as per pre-operative assessment

## 2015-07-02 NOTE — H&P (View-Only) (Signed)
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 He has had 2 prior plication procedures in the distal and central portion of his fistula. He has had this fistula for several years. He usually dialyzes on Monday Wednesday and Friday. He does home dialysis. He denies any prior bleeding episodes. He states the area in question has developed end out shiny skin and has grown significantly over the last several weeks. He is not cannulating the fistula at this site. His only other chronic medical problem is hypertension which is currently controlled.  Review of systems: He denies chest pain. He denies shortness of breath.  Past Medical History  Diagnosis Date  . Hypertension   . Chronic kidney disease   . Chronic headaches     posterior occipital headaches  . Heart murmur   . History of renal calculi   . Allergy   . Anemia     Iron deficiency  . Thyroid disease   . History of Cytoxan exposure   . GERD (gastroesophageal reflux disease)     hx of- no meds currently  . Coronary artery disease     Mitral Valve Prolapse- per Cardiologist Dr. Bettina Gavia  . Constipation    Current Outpatient Prescriptions on File Prior to Visit  Medication Sig Dispense Refill  . calcitRIOL (ROCALTROL) 0.25 MCG capsule Take 0.25 mcg by mouth daily.    . calcium carbonate (TUMS EX) 750 MG chewable tablet Chew 2-3 tablets by mouth 3 (three) times daily with meals. Chew 3 tablets by mouth 3 times daily with meals and chew 2 tablets by mouth with snacks.    . cetirizine (ZYRTEC) 10 MG tablet Take 10 mg by mouth daily as needed for allergies.     Marland Kitchen docusate sodium (STOOL SOFTENER) 100 MG capsule Take 100 mg by mouth daily as needed for mild constipation.    Marland Kitchen HYDROcodone-acetaminophen (NORCO) 10-325 MG per tablet Take 1 tablet by mouth every 4 (four) hours as needed for moderate pain.     . promethazine (PHENERGAN) 25 MG tablet Take 25 mg by  mouth every 8 (eight) hours as needed for nausea or vomiting.    . SENSIPAR 30 MG tablet Take 30 mg by mouth daily.     . simvastatin (ZOCOR) 20 MG tablet Take 20 mg by mouth at bedtime.      No current facility-administered medications on file prior to visit.   Allergies  Allergen Reactions  . Lisinopril Cough    Physical exam:  Filed Vitals:   06/11/15 1031 06/11/15 1033  BP: 173/103 163/102  Pulse: 89 89  Temp: 99.1 F (37.3 C)   Resp: 14   Height: 6\' 3"  (1.905 m)   Weight: 153 lb (69.4 kg)   SpO2: 100%     Left upper extremity: Slightly pulsatile fistula 2 cm pseudoaneurysm proximal third of fistula laterally with slightly thinned out skin no eschar  Assessment: Growing pseudoaneurysm left arm AV fistula  Plan: Plication of left arm AV fistula when the next few weeks. Patient will call to schedule. Patient noted to be hypertensive on exam today he will follow-up with his nephrologist regarding this.  Ruta Hinds, MD [facility] Office: [phone] Pager: [phone]

## 2015-07-02 NOTE — Anesthesia Postprocedure Evaluation (Signed)
Anesthesia Post Note  Patient: Christopher Ritter  Procedure(s) Performed: Procedure(s) (LRB): Pseudoaneurysm Repair of Left Upper Arm Fistula (Left)  Patient location during evaluation: PACU Anesthesia Type: General Level of consciousness: awake Pain management: pain level controlled Vital Signs Assessment: post-procedure vital signs reviewed and stable Respiratory status: spontaneous breathing Cardiovascular status: stable Postop Assessment: no signs of nausea or vomiting Anesthetic complications: no    Last Vitals:  Filed Vitals:   07/02/15 1030 07/02/15 1039  BP:  138/89  Pulse:  73  Temp: 36.7 C   Resp:  16    Last Pain:  Filed Vitals:   07/02/15 1103  PainSc: 2                  Dontavia Brand

## 2015-07-02 NOTE — Interval H&P Note (Signed)
[facility]  History and Physical Update  The patient was interviewed and re-examined.  The patient's previous History and Physical has been reviewed and is unchanged from Dr. Nona Dell consult.  There is no change in the plan of care: plication of L upper arm fistula.  Risk, benefits, and alternatives to plication of arteriovenous fistula were discussed.  The patient is aware the risks include but are not limited to: bleeding, infection, steal syndrome, nerve damage, ischemic monomelic neuropathy, thombosis, need for additional procedures, death and stroke.  The patient agrees to proceed forward with the procedure. Adele Barthel, MD [facility] Office: [phone] Pager: [phone]  07/02/2015, 7:36 AM

## 2015-07-03 ENCOUNTER — Encounter (HOSPITAL_COMMUNITY): Payer: Self-pay | Admitting: Vascular Surgery

## 2015-07-03 ENCOUNTER — Telehealth: Payer: Self-pay | Admitting: Vascular Surgery

## 2015-07-03 DIAGNOSIS — Z79899 Other long term (current) drug therapy: Secondary | ICD-10-CM | POA: Diagnosis not present

## 2015-07-03 DIAGNOSIS — D509 Iron deficiency anemia, unspecified: Secondary | ICD-10-CM | POA: Diagnosis not present

## 2015-07-03 DIAGNOSIS — D630 Anemia in neoplastic disease: Secondary | ICD-10-CM | POA: Diagnosis not present

## 2015-07-03 DIAGNOSIS — N186 End stage renal disease: Secondary | ICD-10-CM | POA: Diagnosis not present

## 2015-07-03 DIAGNOSIS — N2589 Other disorders resulting from impaired renal tubular function: Secondary | ICD-10-CM | POA: Diagnosis not present

## 2015-07-03 NOTE — Telephone Encounter (Signed)
-----   Message from Mena Goes, RN sent at 07/02/2015 11:04 AM EST ----- Regarding: schedule   ----- Message -----    From: Alvia Grove, PA-C    Sent: 07/02/2015   9:44 AM      To: [facility] Charge Pool  Status post plication of left upper arm pseudoaneurysm 07/02/2015  Follow up with Dr. Bridgett Larsson in 4 weeks.  Thanks Maudie Mercury

## 2015-07-03 NOTE — Telephone Encounter (Signed)
LM for pt re appt, dpm °

## 2015-07-05 DIAGNOSIS — D630 Anemia in neoplastic disease: Secondary | ICD-10-CM | POA: Diagnosis not present

## 2015-07-05 DIAGNOSIS — N08 Glomerular disorders in diseases classified elsewhere: Secondary | ICD-10-CM | POA: Diagnosis not present

## 2015-07-05 DIAGNOSIS — Z992 Dependence on renal dialysis: Secondary | ICD-10-CM | POA: Diagnosis not present

## 2015-07-05 DIAGNOSIS — N2589 Other disorders resulting from impaired renal tubular function: Secondary | ICD-10-CM | POA: Diagnosis not present

## 2015-07-05 DIAGNOSIS — N186 End stage renal disease: Secondary | ICD-10-CM | POA: Diagnosis not present

## 2015-07-05 DIAGNOSIS — Z79899 Other long term (current) drug therapy: Secondary | ICD-10-CM | POA: Diagnosis not present

## 2015-07-05 DIAGNOSIS — D509 Iron deficiency anemia, unspecified: Secondary | ICD-10-CM | POA: Diagnosis not present

## 2015-07-06 DIAGNOSIS — N186 End stage renal disease: Secondary | ICD-10-CM | POA: Diagnosis not present

## 2015-07-06 DIAGNOSIS — D631 Anemia in chronic kidney disease: Secondary | ICD-10-CM | POA: Diagnosis not present

## 2015-07-06 DIAGNOSIS — D630 Anemia in neoplastic disease: Secondary | ICD-10-CM | POA: Diagnosis not present

## 2015-07-08 DIAGNOSIS — D631 Anemia in chronic kidney disease: Secondary | ICD-10-CM | POA: Diagnosis not present

## 2015-07-08 DIAGNOSIS — N186 End stage renal disease: Secondary | ICD-10-CM | POA: Diagnosis not present

## 2015-07-08 DIAGNOSIS — D630 Anemia in neoplastic disease: Secondary | ICD-10-CM | POA: Diagnosis not present

## 2015-07-12 DIAGNOSIS — N186 End stage renal disease: Secondary | ICD-10-CM | POA: Diagnosis not present

## 2015-07-12 DIAGNOSIS — D630 Anemia in neoplastic disease: Secondary | ICD-10-CM | POA: Diagnosis not present

## 2015-07-12 DIAGNOSIS — D631 Anemia in chronic kidney disease: Secondary | ICD-10-CM | POA: Diagnosis not present

## 2015-07-14 DIAGNOSIS — N186 End stage renal disease: Secondary | ICD-10-CM | POA: Diagnosis not present

## 2015-07-14 DIAGNOSIS — D630 Anemia in neoplastic disease: Secondary | ICD-10-CM | POA: Diagnosis not present

## 2015-07-14 DIAGNOSIS — D631 Anemia in chronic kidney disease: Secondary | ICD-10-CM | POA: Diagnosis not present

## 2015-07-15 DIAGNOSIS — N186 End stage renal disease: Secondary | ICD-10-CM | POA: Diagnosis not present

## 2015-07-15 DIAGNOSIS — D630 Anemia in neoplastic disease: Secondary | ICD-10-CM | POA: Diagnosis not present

## 2015-07-15 DIAGNOSIS — D631 Anemia in chronic kidney disease: Secondary | ICD-10-CM | POA: Diagnosis not present

## 2015-07-16 DIAGNOSIS — N186 End stage renal disease: Secondary | ICD-10-CM | POA: Diagnosis not present

## 2015-07-16 DIAGNOSIS — D631 Anemia in chronic kidney disease: Secondary | ICD-10-CM | POA: Diagnosis not present

## 2015-07-16 DIAGNOSIS — D630 Anemia in neoplastic disease: Secondary | ICD-10-CM | POA: Diagnosis not present

## 2015-07-18 DIAGNOSIS — N186 End stage renal disease: Secondary | ICD-10-CM | POA: Diagnosis not present

## 2015-07-18 DIAGNOSIS — D631 Anemia in chronic kidney disease: Secondary | ICD-10-CM | POA: Diagnosis not present

## 2015-07-18 DIAGNOSIS — D630 Anemia in neoplastic disease: Secondary | ICD-10-CM | POA: Diagnosis not present

## 2015-07-20 DIAGNOSIS — N186 End stage renal disease: Secondary | ICD-10-CM | POA: Diagnosis not present

## 2015-07-20 DIAGNOSIS — D631 Anemia in chronic kidney disease: Secondary | ICD-10-CM | POA: Diagnosis not present

## 2015-07-20 DIAGNOSIS — D630 Anemia in neoplastic disease: Secondary | ICD-10-CM | POA: Diagnosis not present

## 2015-07-22 DIAGNOSIS — R05 Cough: Secondary | ICD-10-CM | POA: Diagnosis not present

## 2015-07-22 DIAGNOSIS — J069 Acute upper respiratory infection, unspecified: Secondary | ICD-10-CM | POA: Diagnosis not present

## 2015-07-23 DIAGNOSIS — D630 Anemia in neoplastic disease: Secondary | ICD-10-CM | POA: Diagnosis not present

## 2015-07-23 DIAGNOSIS — D631 Anemia in chronic kidney disease: Secondary | ICD-10-CM | POA: Diagnosis not present

## 2015-07-23 DIAGNOSIS — N186 End stage renal disease: Secondary | ICD-10-CM | POA: Diagnosis not present

## 2015-07-25 ENCOUNTER — Encounter: Payer: Self-pay | Admitting: Vascular Surgery

## 2015-07-26 DIAGNOSIS — N186 End stage renal disease: Secondary | ICD-10-CM | POA: Diagnosis not present

## 2015-07-26 DIAGNOSIS — D630 Anemia in neoplastic disease: Secondary | ICD-10-CM | POA: Diagnosis not present

## 2015-07-26 DIAGNOSIS — D631 Anemia in chronic kidney disease: Secondary | ICD-10-CM | POA: Diagnosis not present

## 2015-07-28 DIAGNOSIS — N186 End stage renal disease: Secondary | ICD-10-CM | POA: Diagnosis not present

## 2015-07-28 DIAGNOSIS — D630 Anemia in neoplastic disease: Secondary | ICD-10-CM | POA: Diagnosis not present

## 2015-07-28 DIAGNOSIS — D631 Anemia in chronic kidney disease: Secondary | ICD-10-CM | POA: Diagnosis not present

## 2015-07-29 DIAGNOSIS — D630 Anemia in neoplastic disease: Secondary | ICD-10-CM | POA: Diagnosis not present

## 2015-07-29 DIAGNOSIS — D631 Anemia in chronic kidney disease: Secondary | ICD-10-CM | POA: Diagnosis not present

## 2015-07-29 DIAGNOSIS — N186 End stage renal disease: Secondary | ICD-10-CM | POA: Diagnosis not present

## 2015-07-31 DIAGNOSIS — D631 Anemia in chronic kidney disease: Secondary | ICD-10-CM | POA: Diagnosis not present

## 2015-07-31 DIAGNOSIS — D630 Anemia in neoplastic disease: Secondary | ICD-10-CM | POA: Diagnosis not present

## 2015-07-31 DIAGNOSIS — N186 End stage renal disease: Secondary | ICD-10-CM | POA: Diagnosis not present

## 2015-08-01 ENCOUNTER — Ambulatory Visit (INDEPENDENT_AMBULATORY_CARE_PROVIDER_SITE_OTHER): Payer: Self-pay | Admitting: Vascular Surgery

## 2015-08-01 ENCOUNTER — Encounter: Payer: Self-pay | Admitting: Vascular Surgery

## 2015-08-01 ENCOUNTER — Ambulatory Visit (HOSPITAL_COMMUNITY)
Admission: RE | Admit: 2015-08-01 | Discharge: 2015-08-01 | Disposition: A | Discharge status: Home / Self Care | Payer: Medicare Other | Source: Ambulatory Visit | Attending: Vascular Surgery | Admitting: Vascular Surgery

## 2015-08-01 VITALS — BP 142/92 | HR 83 | Temp 98.3°F | Ht 75.0 in | Wt 145.0 lb

## 2015-08-01 DIAGNOSIS — M79602 Pain in left arm: Secondary | ICD-10-CM

## 2015-08-01 DIAGNOSIS — N189 Chronic kidney disease, unspecified: Secondary | ICD-10-CM | POA: Insufficient documentation

## 2015-08-01 DIAGNOSIS — I871 Compression of vein: Secondary | ICD-10-CM

## 2015-08-01 DIAGNOSIS — N186 End stage renal disease: Secondary | ICD-10-CM

## 2015-08-01 DIAGNOSIS — I129 Hypertensive chronic kidney disease with stage 1 through stage 4 chronic kidney disease, or unspecified chronic kidney disease: Secondary | ICD-10-CM | POA: Insufficient documentation

## 2015-08-01 NOTE — Progress Notes (Addendum)
    Postoperative Access Visit   History of Present Illness  Christopher Ritter is a 45 y.o. year old male who presents for postoperative follow-up for: plcation of LUA AVF (Date: 07/02/15).  The patient's wounds are healed.  The patient notes no steal symptoms.  The patient is able to complete their activities of daily living.  The patient's current symptoms are: excessively long bleeding after cannulation.  For VQI Use Only  PRE-ADM LIVING: Home  AMB STATUS: Ambulatory  Physical Examination Filed Vitals:   08/01/15 0930 08/01/15 0934  BP: 150/95 142/92  Pulse: 83   Temp: 98.3 F (36.8 C)     LUE: Incision is healed, skin feels warm, hand grip is 5/5, sensation in digits is intact, palpable thrill, bruit can be auscultated   Medical Decision Making  [name] is a 45 y.o. year old male who presents s/p plication of LUA AVF, residual venous stenosis   The patient's access is ready for use.  Options to help with bleeding after cannulation include: venoplasty of proximal segment, cephalic vein turndown to axillary vein and  shortening, straightening, plicating entire fistula.  Will get venous duplex to help determine which option to proceed with.  The patient's tunneled dialysis catheter can be removed after two successful cannulations and completed dialysis treatments.  Thank you for allowing [place] to participate in this patient's care.  Adele Barthel, MD [facility] Office: [phone] Pager: [phone]  08/01/2015, 10:01 AM   Addendum  LUE venous duplex (08/01/2015): patent L axillary vein, 7.5 mm  - At this point, the patient declines any further interventions, but is considering the cephalic vein turndown if needed.  Adele Barthel, MD [facility] [facility] Office: [phone] Pager: [phone]  08/01/2015, 11:07 AM

## 2015-08-02 DIAGNOSIS — D631 Anemia in chronic kidney disease: Secondary | ICD-10-CM | POA: Diagnosis not present

## 2015-08-02 DIAGNOSIS — D630 Anemia in neoplastic disease: Secondary | ICD-10-CM | POA: Diagnosis not present

## 2015-08-02 DIAGNOSIS — N186 End stage renal disease: Secondary | ICD-10-CM | POA: Diagnosis not present

## 2015-08-05 DIAGNOSIS — Z992 Dependence on renal dialysis: Secondary | ICD-10-CM | POA: Diagnosis not present

## 2015-08-05 DIAGNOSIS — D630 Anemia in neoplastic disease: Secondary | ICD-10-CM | POA: Diagnosis not present

## 2015-08-05 DIAGNOSIS — N186 End stage renal disease: Secondary | ICD-10-CM | POA: Diagnosis not present

## 2015-08-05 DIAGNOSIS — D631 Anemia in chronic kidney disease: Secondary | ICD-10-CM | POA: Diagnosis not present

## 2015-08-05 DIAGNOSIS — N08 Glomerular disorders in diseases classified elsewhere: Secondary | ICD-10-CM | POA: Diagnosis not present

## 2015-08-06 DIAGNOSIS — N2589 Other disorders resulting from impaired renal tubular function: Secondary | ICD-10-CM | POA: Diagnosis not present

## 2015-08-06 DIAGNOSIS — N2581 Secondary hyperparathyroidism of renal origin: Secondary | ICD-10-CM | POA: Diagnosis not present

## 2015-08-06 DIAGNOSIS — N186 End stage renal disease: Secondary | ICD-10-CM | POA: Diagnosis not present

## 2015-08-06 DIAGNOSIS — Z4931 Encounter for adequacy testing for hemodialysis: Secondary | ICD-10-CM | POA: Diagnosis not present

## 2015-08-06 DIAGNOSIS — E440 Moderate protein-calorie malnutrition: Secondary | ICD-10-CM | POA: Diagnosis not present

## 2015-08-06 DIAGNOSIS — D509 Iron deficiency anemia, unspecified: Secondary | ICD-10-CM | POA: Diagnosis not present

## 2015-08-06 DIAGNOSIS — Z79899 Other long term (current) drug therapy: Secondary | ICD-10-CM | POA: Diagnosis not present

## 2015-08-06 DIAGNOSIS — E874 Mixed disorder of acid-base balance: Secondary | ICD-10-CM | POA: Diagnosis not present

## 2015-08-06 DIAGNOSIS — D630 Anemia in neoplastic disease: Secondary | ICD-10-CM | POA: Diagnosis not present

## 2015-08-06 DIAGNOSIS — D631 Anemia in chronic kidney disease: Secondary | ICD-10-CM | POA: Diagnosis not present

## 2015-08-07 DIAGNOSIS — M87021 Idiopathic aseptic necrosis of right humerus: Secondary | ICD-10-CM | POA: Diagnosis not present

## 2015-08-07 DIAGNOSIS — G894 Chronic pain syndrome: Secondary | ICD-10-CM | POA: Diagnosis not present

## 2015-08-07 DIAGNOSIS — M87022 Idiopathic aseptic necrosis of left humerus: Secondary | ICD-10-CM | POA: Diagnosis not present

## 2015-08-07 DIAGNOSIS — N186 End stage renal disease: Secondary | ICD-10-CM | POA: Diagnosis not present

## 2015-08-07 DIAGNOSIS — Z79899 Other long term (current) drug therapy: Secondary | ICD-10-CM | POA: Diagnosis not present

## 2015-08-07 DIAGNOSIS — D631 Anemia in chronic kidney disease: Secondary | ICD-10-CM | POA: Diagnosis not present

## 2015-08-07 DIAGNOSIS — N2589 Other disorders resulting from impaired renal tubular function: Secondary | ICD-10-CM | POA: Diagnosis not present

## 2015-08-07 DIAGNOSIS — D630 Anemia in neoplastic disease: Secondary | ICD-10-CM | POA: Diagnosis not present

## 2015-08-07 DIAGNOSIS — Z79891 Long term (current) use of opiate analgesic: Secondary | ICD-10-CM | POA: Diagnosis not present

## 2015-08-07 DIAGNOSIS — D509 Iron deficiency anemia, unspecified: Secondary | ICD-10-CM | POA: Diagnosis not present

## 2015-08-08 DIAGNOSIS — Z79899 Other long term (current) drug therapy: Secondary | ICD-10-CM | POA: Diagnosis not present

## 2015-08-08 DIAGNOSIS — D630 Anemia in neoplastic disease: Secondary | ICD-10-CM | POA: Diagnosis not present

## 2015-08-08 DIAGNOSIS — D631 Anemia in chronic kidney disease: Secondary | ICD-10-CM | POA: Diagnosis not present

## 2015-08-08 DIAGNOSIS — D509 Iron deficiency anemia, unspecified: Secondary | ICD-10-CM | POA: Diagnosis not present

## 2015-08-08 DIAGNOSIS — N186 End stage renal disease: Secondary | ICD-10-CM | POA: Diagnosis not present

## 2015-08-08 DIAGNOSIS — N2589 Other disorders resulting from impaired renal tubular function: Secondary | ICD-10-CM | POA: Diagnosis not present

## 2015-08-10 DIAGNOSIS — N2589 Other disorders resulting from impaired renal tubular function: Secondary | ICD-10-CM | POA: Diagnosis not present

## 2015-08-10 DIAGNOSIS — D631 Anemia in chronic kidney disease: Secondary | ICD-10-CM | POA: Diagnosis not present

## 2015-08-10 DIAGNOSIS — D630 Anemia in neoplastic disease: Secondary | ICD-10-CM | POA: Diagnosis not present

## 2015-08-10 DIAGNOSIS — N186 End stage renal disease: Secondary | ICD-10-CM | POA: Diagnosis not present

## 2015-08-10 DIAGNOSIS — D509 Iron deficiency anemia, unspecified: Secondary | ICD-10-CM | POA: Diagnosis not present

## 2015-08-10 DIAGNOSIS — Z79899 Other long term (current) drug therapy: Secondary | ICD-10-CM | POA: Diagnosis not present

## 2015-08-12 DIAGNOSIS — N2589 Other disorders resulting from impaired renal tubular function: Secondary | ICD-10-CM | POA: Diagnosis not present

## 2015-08-12 DIAGNOSIS — D630 Anemia in neoplastic disease: Secondary | ICD-10-CM | POA: Diagnosis not present

## 2015-08-12 DIAGNOSIS — Z79899 Other long term (current) drug therapy: Secondary | ICD-10-CM | POA: Diagnosis not present

## 2015-08-12 DIAGNOSIS — N186 End stage renal disease: Secondary | ICD-10-CM | POA: Diagnosis not present

## 2015-08-12 DIAGNOSIS — D509 Iron deficiency anemia, unspecified: Secondary | ICD-10-CM | POA: Diagnosis not present

## 2015-08-12 DIAGNOSIS — D631 Anemia in chronic kidney disease: Secondary | ICD-10-CM | POA: Diagnosis not present

## 2015-08-15 DIAGNOSIS — Z79899 Other long term (current) drug therapy: Secondary | ICD-10-CM | POA: Diagnosis not present

## 2015-08-15 DIAGNOSIS — D630 Anemia in neoplastic disease: Secondary | ICD-10-CM | POA: Diagnosis not present

## 2015-08-15 DIAGNOSIS — N2589 Other disorders resulting from impaired renal tubular function: Secondary | ICD-10-CM | POA: Diagnosis not present

## 2015-08-15 DIAGNOSIS — N186 End stage renal disease: Secondary | ICD-10-CM | POA: Diagnosis not present

## 2015-08-15 DIAGNOSIS — D631 Anemia in chronic kidney disease: Secondary | ICD-10-CM | POA: Diagnosis not present

## 2015-08-15 DIAGNOSIS — D509 Iron deficiency anemia, unspecified: Secondary | ICD-10-CM | POA: Diagnosis not present

## 2015-08-17 DIAGNOSIS — N186 End stage renal disease: Secondary | ICD-10-CM | POA: Diagnosis not present

## 2015-08-17 DIAGNOSIS — D509 Iron deficiency anemia, unspecified: Secondary | ICD-10-CM | POA: Diagnosis not present

## 2015-08-17 DIAGNOSIS — Z79899 Other long term (current) drug therapy: Secondary | ICD-10-CM | POA: Diagnosis not present

## 2015-08-17 DIAGNOSIS — D631 Anemia in chronic kidney disease: Secondary | ICD-10-CM | POA: Diagnosis not present

## 2015-08-17 DIAGNOSIS — D630 Anemia in neoplastic disease: Secondary | ICD-10-CM | POA: Diagnosis not present

## 2015-08-17 DIAGNOSIS — N2589 Other disorders resulting from impaired renal tubular function: Secondary | ICD-10-CM | POA: Diagnosis not present

## 2015-08-20 DIAGNOSIS — N2589 Other disorders resulting from impaired renal tubular function: Secondary | ICD-10-CM | POA: Diagnosis not present

## 2015-08-20 DIAGNOSIS — Z79899 Other long term (current) drug therapy: Secondary | ICD-10-CM | POA: Diagnosis not present

## 2015-08-20 DIAGNOSIS — D631 Anemia in chronic kidney disease: Secondary | ICD-10-CM | POA: Diagnosis not present

## 2015-08-20 DIAGNOSIS — D630 Anemia in neoplastic disease: Secondary | ICD-10-CM | POA: Diagnosis not present

## 2015-08-20 DIAGNOSIS — D509 Iron deficiency anemia, unspecified: Secondary | ICD-10-CM | POA: Diagnosis not present

## 2015-08-20 DIAGNOSIS — J0100 Acute maxillary sinusitis, unspecified: Secondary | ICD-10-CM | POA: Diagnosis not present

## 2015-08-20 DIAGNOSIS — J029 Acute pharyngitis, unspecified: Secondary | ICD-10-CM | POA: Diagnosis not present

## 2015-08-20 DIAGNOSIS — N186 End stage renal disease: Secondary | ICD-10-CM | POA: Diagnosis not present

## 2015-08-21 DIAGNOSIS — N2589 Other disorders resulting from impaired renal tubular function: Secondary | ICD-10-CM | POA: Diagnosis not present

## 2015-08-21 DIAGNOSIS — D631 Anemia in chronic kidney disease: Secondary | ICD-10-CM | POA: Diagnosis not present

## 2015-08-21 DIAGNOSIS — N186 End stage renal disease: Secondary | ICD-10-CM | POA: Diagnosis not present

## 2015-08-21 DIAGNOSIS — D509 Iron deficiency anemia, unspecified: Secondary | ICD-10-CM | POA: Diagnosis not present

## 2015-08-21 DIAGNOSIS — D630 Anemia in neoplastic disease: Secondary | ICD-10-CM | POA: Diagnosis not present

## 2015-08-21 DIAGNOSIS — Z79899 Other long term (current) drug therapy: Secondary | ICD-10-CM | POA: Diagnosis not present

## 2015-08-22 DIAGNOSIS — Z79899 Other long term (current) drug therapy: Secondary | ICD-10-CM | POA: Diagnosis not present

## 2015-08-22 DIAGNOSIS — N2589 Other disorders resulting from impaired renal tubular function: Secondary | ICD-10-CM | POA: Diagnosis not present

## 2015-08-22 DIAGNOSIS — D509 Iron deficiency anemia, unspecified: Secondary | ICD-10-CM | POA: Diagnosis not present

## 2015-08-22 DIAGNOSIS — N186 End stage renal disease: Secondary | ICD-10-CM | POA: Diagnosis not present

## 2015-08-22 DIAGNOSIS — D630 Anemia in neoplastic disease: Secondary | ICD-10-CM | POA: Diagnosis not present

## 2015-08-22 DIAGNOSIS — D631 Anemia in chronic kidney disease: Secondary | ICD-10-CM | POA: Diagnosis not present

## 2015-08-23 DIAGNOSIS — N186 End stage renal disease: Secondary | ICD-10-CM | POA: Diagnosis not present

## 2015-08-23 DIAGNOSIS — D630 Anemia in neoplastic disease: Secondary | ICD-10-CM | POA: Diagnosis not present

## 2015-08-23 DIAGNOSIS — D631 Anemia in chronic kidney disease: Secondary | ICD-10-CM | POA: Diagnosis not present

## 2015-08-23 DIAGNOSIS — Z79899 Other long term (current) drug therapy: Secondary | ICD-10-CM | POA: Diagnosis not present

## 2015-08-23 DIAGNOSIS — N2589 Other disorders resulting from impaired renal tubular function: Secondary | ICD-10-CM | POA: Diagnosis not present

## 2015-08-23 DIAGNOSIS — D509 Iron deficiency anemia, unspecified: Secondary | ICD-10-CM | POA: Diagnosis not present

## 2015-08-25 DIAGNOSIS — Z79899 Other long term (current) drug therapy: Secondary | ICD-10-CM | POA: Diagnosis not present

## 2015-08-25 DIAGNOSIS — N186 End stage renal disease: Secondary | ICD-10-CM | POA: Diagnosis not present

## 2015-08-25 DIAGNOSIS — N2589 Other disorders resulting from impaired renal tubular function: Secondary | ICD-10-CM | POA: Diagnosis not present

## 2015-08-25 DIAGNOSIS — D630 Anemia in neoplastic disease: Secondary | ICD-10-CM | POA: Diagnosis not present

## 2015-08-25 DIAGNOSIS — D631 Anemia in chronic kidney disease: Secondary | ICD-10-CM | POA: Diagnosis not present

## 2015-08-25 DIAGNOSIS — D509 Iron deficiency anemia, unspecified: Secondary | ICD-10-CM | POA: Diagnosis not present

## 2015-08-26 DIAGNOSIS — D630 Anemia in neoplastic disease: Secondary | ICD-10-CM | POA: Diagnosis not present

## 2015-08-26 DIAGNOSIS — D509 Iron deficiency anemia, unspecified: Secondary | ICD-10-CM | POA: Diagnosis not present

## 2015-08-26 DIAGNOSIS — N186 End stage renal disease: Secondary | ICD-10-CM | POA: Diagnosis not present

## 2015-08-26 DIAGNOSIS — N2589 Other disorders resulting from impaired renal tubular function: Secondary | ICD-10-CM | POA: Diagnosis not present

## 2015-08-26 DIAGNOSIS — D631 Anemia in chronic kidney disease: Secondary | ICD-10-CM | POA: Diagnosis not present

## 2015-08-26 DIAGNOSIS — Z79899 Other long term (current) drug therapy: Secondary | ICD-10-CM | POA: Diagnosis not present

## 2015-08-28 DIAGNOSIS — Z79899 Other long term (current) drug therapy: Secondary | ICD-10-CM | POA: Diagnosis not present

## 2015-08-28 DIAGNOSIS — N186 End stage renal disease: Secondary | ICD-10-CM | POA: Diagnosis not present

## 2015-08-28 DIAGNOSIS — N2589 Other disorders resulting from impaired renal tubular function: Secondary | ICD-10-CM | POA: Diagnosis not present

## 2015-08-28 DIAGNOSIS — D509 Iron deficiency anemia, unspecified: Secondary | ICD-10-CM | POA: Diagnosis not present

## 2015-08-28 DIAGNOSIS — D630 Anemia in neoplastic disease: Secondary | ICD-10-CM | POA: Diagnosis not present

## 2015-08-28 DIAGNOSIS — D631 Anemia in chronic kidney disease: Secondary | ICD-10-CM | POA: Diagnosis not present

## 2015-08-29 DIAGNOSIS — D630 Anemia in neoplastic disease: Secondary | ICD-10-CM | POA: Diagnosis not present

## 2015-08-29 DIAGNOSIS — N186 End stage renal disease: Secondary | ICD-10-CM | POA: Diagnosis not present

## 2015-08-29 DIAGNOSIS — D631 Anemia in chronic kidney disease: Secondary | ICD-10-CM | POA: Diagnosis not present

## 2015-08-29 DIAGNOSIS — N2589 Other disorders resulting from impaired renal tubular function: Secondary | ICD-10-CM | POA: Diagnosis not present

## 2015-08-29 DIAGNOSIS — Z79899 Other long term (current) drug therapy: Secondary | ICD-10-CM | POA: Diagnosis not present

## 2015-08-29 DIAGNOSIS — D509 Iron deficiency anemia, unspecified: Secondary | ICD-10-CM | POA: Diagnosis not present

## 2015-08-30 DIAGNOSIS — Z79899 Other long term (current) drug therapy: Secondary | ICD-10-CM | POA: Diagnosis not present

## 2015-08-30 DIAGNOSIS — D631 Anemia in chronic kidney disease: Secondary | ICD-10-CM | POA: Diagnosis not present

## 2015-08-30 DIAGNOSIS — D630 Anemia in neoplastic disease: Secondary | ICD-10-CM | POA: Diagnosis not present

## 2015-08-30 DIAGNOSIS — N186 End stage renal disease: Secondary | ICD-10-CM | POA: Diagnosis not present

## 2015-08-30 DIAGNOSIS — N2589 Other disorders resulting from impaired renal tubular function: Secondary | ICD-10-CM | POA: Diagnosis not present

## 2015-08-30 DIAGNOSIS — D509 Iron deficiency anemia, unspecified: Secondary | ICD-10-CM | POA: Diagnosis not present

## 2015-09-01 DIAGNOSIS — D509 Iron deficiency anemia, unspecified: Secondary | ICD-10-CM | POA: Diagnosis not present

## 2015-09-01 DIAGNOSIS — N2589 Other disorders resulting from impaired renal tubular function: Secondary | ICD-10-CM | POA: Diagnosis not present

## 2015-09-01 DIAGNOSIS — D630 Anemia in neoplastic disease: Secondary | ICD-10-CM | POA: Diagnosis not present

## 2015-09-01 DIAGNOSIS — Z79899 Other long term (current) drug therapy: Secondary | ICD-10-CM | POA: Diagnosis not present

## 2015-09-01 DIAGNOSIS — N186 End stage renal disease: Secondary | ICD-10-CM | POA: Diagnosis not present

## 2015-09-01 DIAGNOSIS — D631 Anemia in chronic kidney disease: Secondary | ICD-10-CM | POA: Diagnosis not present

## 2015-09-02 DIAGNOSIS — N08 Glomerular disorders in diseases classified elsewhere: Secondary | ICD-10-CM | POA: Diagnosis not present

## 2015-09-02 DIAGNOSIS — D630 Anemia in neoplastic disease: Secondary | ICD-10-CM | POA: Diagnosis not present

## 2015-09-02 DIAGNOSIS — Z992 Dependence on renal dialysis: Secondary | ICD-10-CM | POA: Diagnosis not present

## 2015-09-02 DIAGNOSIS — J209 Acute bronchitis, unspecified: Secondary | ICD-10-CM | POA: Diagnosis not present

## 2015-09-02 DIAGNOSIS — J0100 Acute maxillary sinusitis, unspecified: Secondary | ICD-10-CM | POA: Diagnosis not present

## 2015-09-02 DIAGNOSIS — R509 Fever, unspecified: Secondary | ICD-10-CM | POA: Diagnosis not present

## 2015-09-02 DIAGNOSIS — D631 Anemia in chronic kidney disease: Secondary | ICD-10-CM | POA: Diagnosis not present

## 2015-09-02 DIAGNOSIS — N2589 Other disorders resulting from impaired renal tubular function: Secondary | ICD-10-CM | POA: Diagnosis not present

## 2015-09-02 DIAGNOSIS — D509 Iron deficiency anemia, unspecified: Secondary | ICD-10-CM | POA: Diagnosis not present

## 2015-09-02 DIAGNOSIS — N186 End stage renal disease: Secondary | ICD-10-CM | POA: Diagnosis not present

## 2015-09-02 DIAGNOSIS — Z79899 Other long term (current) drug therapy: Secondary | ICD-10-CM | POA: Diagnosis not present

## 2015-09-04 DIAGNOSIS — D630 Anemia in neoplastic disease: Secondary | ICD-10-CM | POA: Diagnosis not present

## 2015-09-04 DIAGNOSIS — Z79899 Other long term (current) drug therapy: Secondary | ICD-10-CM | POA: Diagnosis not present

## 2015-09-04 DIAGNOSIS — E875 Hyperkalemia: Secondary | ICD-10-CM | POA: Diagnosis not present

## 2015-09-04 DIAGNOSIS — Z7689 Persons encountering health services in other specified circumstances: Secondary | ICD-10-CM | POA: Diagnosis not present

## 2015-09-04 DIAGNOSIS — N2589 Other disorders resulting from impaired renal tubular function: Secondary | ICD-10-CM | POA: Diagnosis not present

## 2015-09-04 DIAGNOSIS — E8770 Fluid overload, unspecified: Secondary | ICD-10-CM | POA: Diagnosis not present

## 2015-09-04 DIAGNOSIS — N186 End stage renal disease: Secondary | ICD-10-CM | POA: Diagnosis not present

## 2015-09-04 DIAGNOSIS — D509 Iron deficiency anemia, unspecified: Secondary | ICD-10-CM | POA: Diagnosis not present

## 2015-09-04 DIAGNOSIS — N2581 Secondary hyperparathyroidism of renal origin: Secondary | ICD-10-CM | POA: Diagnosis not present

## 2015-09-04 DIAGNOSIS — Z4931 Encounter for adequacy testing for hemodialysis: Secondary | ICD-10-CM | POA: Diagnosis not present

## 2015-09-04 DIAGNOSIS — E440 Moderate protein-calorie malnutrition: Secondary | ICD-10-CM | POA: Diagnosis not present

## 2015-09-06 DIAGNOSIS — D630 Anemia in neoplastic disease: Secondary | ICD-10-CM | POA: Diagnosis not present

## 2015-09-06 DIAGNOSIS — N186 End stage renal disease: Secondary | ICD-10-CM | POA: Diagnosis not present

## 2015-09-06 DIAGNOSIS — Z7689 Persons encountering health services in other specified circumstances: Secondary | ICD-10-CM | POA: Diagnosis not present

## 2015-09-06 DIAGNOSIS — E440 Moderate protein-calorie malnutrition: Secondary | ICD-10-CM | POA: Diagnosis not present

## 2015-09-06 DIAGNOSIS — D509 Iron deficiency anemia, unspecified: Secondary | ICD-10-CM | POA: Diagnosis not present

## 2015-09-06 DIAGNOSIS — E875 Hyperkalemia: Secondary | ICD-10-CM | POA: Diagnosis not present

## 2015-09-08 DIAGNOSIS — N186 End stage renal disease: Secondary | ICD-10-CM | POA: Diagnosis not present

## 2015-09-08 DIAGNOSIS — Z7689 Persons encountering health services in other specified circumstances: Secondary | ICD-10-CM | POA: Diagnosis not present

## 2015-09-08 DIAGNOSIS — D630 Anemia in neoplastic disease: Secondary | ICD-10-CM | POA: Diagnosis not present

## 2015-09-08 DIAGNOSIS — E440 Moderate protein-calorie malnutrition: Secondary | ICD-10-CM | POA: Diagnosis not present

## 2015-09-08 DIAGNOSIS — D509 Iron deficiency anemia, unspecified: Secondary | ICD-10-CM | POA: Diagnosis not present

## 2015-09-08 DIAGNOSIS — E874 Mixed disorder of acid-base balance: Secondary | ICD-10-CM | POA: Diagnosis not present

## 2015-09-08 DIAGNOSIS — E875 Hyperkalemia: Secondary | ICD-10-CM | POA: Diagnosis not present

## 2015-09-09 DIAGNOSIS — D630 Anemia in neoplastic disease: Secondary | ICD-10-CM | POA: Diagnosis not present

## 2015-09-09 DIAGNOSIS — D509 Iron deficiency anemia, unspecified: Secondary | ICD-10-CM | POA: Diagnosis not present

## 2015-09-09 DIAGNOSIS — E440 Moderate protein-calorie malnutrition: Secondary | ICD-10-CM | POA: Diagnosis not present

## 2015-09-09 DIAGNOSIS — E875 Hyperkalemia: Secondary | ICD-10-CM | POA: Diagnosis not present

## 2015-09-09 DIAGNOSIS — Z7689 Persons encountering health services in other specified circumstances: Secondary | ICD-10-CM | POA: Diagnosis not present

## 2015-09-09 DIAGNOSIS — N186 End stage renal disease: Secondary | ICD-10-CM | POA: Diagnosis not present

## 2015-09-11 DIAGNOSIS — D630 Anemia in neoplastic disease: Secondary | ICD-10-CM | POA: Diagnosis not present

## 2015-09-11 DIAGNOSIS — Z7689 Persons encountering health services in other specified circumstances: Secondary | ICD-10-CM | POA: Diagnosis not present

## 2015-09-11 DIAGNOSIS — D509 Iron deficiency anemia, unspecified: Secondary | ICD-10-CM | POA: Diagnosis not present

## 2015-09-11 DIAGNOSIS — E440 Moderate protein-calorie malnutrition: Secondary | ICD-10-CM | POA: Diagnosis not present

## 2015-09-11 DIAGNOSIS — N186 End stage renal disease: Secondary | ICD-10-CM | POA: Diagnosis not present

## 2015-09-11 DIAGNOSIS — E875 Hyperkalemia: Secondary | ICD-10-CM | POA: Diagnosis not present

## 2015-09-12 DIAGNOSIS — D509 Iron deficiency anemia, unspecified: Secondary | ICD-10-CM | POA: Diagnosis not present

## 2015-09-12 DIAGNOSIS — E440 Moderate protein-calorie malnutrition: Secondary | ICD-10-CM | POA: Diagnosis not present

## 2015-09-12 DIAGNOSIS — Z7689 Persons encountering health services in other specified circumstances: Secondary | ICD-10-CM | POA: Diagnosis not present

## 2015-09-12 DIAGNOSIS — D630 Anemia in neoplastic disease: Secondary | ICD-10-CM | POA: Diagnosis not present

## 2015-09-12 DIAGNOSIS — E875 Hyperkalemia: Secondary | ICD-10-CM | POA: Diagnosis not present

## 2015-09-12 DIAGNOSIS — N186 End stage renal disease: Secondary | ICD-10-CM | POA: Diagnosis not present

## 2015-09-13 DIAGNOSIS — D509 Iron deficiency anemia, unspecified: Secondary | ICD-10-CM | POA: Diagnosis not present

## 2015-09-13 DIAGNOSIS — N186 End stage renal disease: Secondary | ICD-10-CM | POA: Diagnosis not present

## 2015-09-13 DIAGNOSIS — D630 Anemia in neoplastic disease: Secondary | ICD-10-CM | POA: Diagnosis not present

## 2015-09-13 DIAGNOSIS — E875 Hyperkalemia: Secondary | ICD-10-CM | POA: Diagnosis not present

## 2015-09-13 DIAGNOSIS — E440 Moderate protein-calorie malnutrition: Secondary | ICD-10-CM | POA: Diagnosis not present

## 2015-09-13 DIAGNOSIS — Z7689 Persons encountering health services in other specified circumstances: Secondary | ICD-10-CM | POA: Diagnosis not present

## 2015-09-15 DIAGNOSIS — N186 End stage renal disease: Secondary | ICD-10-CM | POA: Diagnosis not present

## 2015-09-15 DIAGNOSIS — Z7689 Persons encountering health services in other specified circumstances: Secondary | ICD-10-CM | POA: Diagnosis not present

## 2015-09-15 DIAGNOSIS — D509 Iron deficiency anemia, unspecified: Secondary | ICD-10-CM | POA: Diagnosis not present

## 2015-09-15 DIAGNOSIS — E875 Hyperkalemia: Secondary | ICD-10-CM | POA: Diagnosis not present

## 2015-09-15 DIAGNOSIS — E440 Moderate protein-calorie malnutrition: Secondary | ICD-10-CM | POA: Diagnosis not present

## 2015-09-15 DIAGNOSIS — D630 Anemia in neoplastic disease: Secondary | ICD-10-CM | POA: Diagnosis not present

## 2015-09-16 DIAGNOSIS — D630 Anemia in neoplastic disease: Secondary | ICD-10-CM | POA: Diagnosis not present

## 2015-09-16 DIAGNOSIS — D509 Iron deficiency anemia, unspecified: Secondary | ICD-10-CM | POA: Diagnosis not present

## 2015-09-16 DIAGNOSIS — E875 Hyperkalemia: Secondary | ICD-10-CM | POA: Diagnosis not present

## 2015-09-16 DIAGNOSIS — N186 End stage renal disease: Secondary | ICD-10-CM | POA: Diagnosis not present

## 2015-09-16 DIAGNOSIS — Z7689 Persons encountering health services in other specified circumstances: Secondary | ICD-10-CM | POA: Diagnosis not present

## 2015-09-16 DIAGNOSIS — E440 Moderate protein-calorie malnutrition: Secondary | ICD-10-CM | POA: Diagnosis not present

## 2015-09-18 DIAGNOSIS — D509 Iron deficiency anemia, unspecified: Secondary | ICD-10-CM | POA: Diagnosis not present

## 2015-09-18 DIAGNOSIS — D630 Anemia in neoplastic disease: Secondary | ICD-10-CM | POA: Diagnosis not present

## 2015-09-18 DIAGNOSIS — Z7689 Persons encountering health services in other specified circumstances: Secondary | ICD-10-CM | POA: Diagnosis not present

## 2015-09-18 DIAGNOSIS — E875 Hyperkalemia: Secondary | ICD-10-CM | POA: Diagnosis not present

## 2015-09-18 DIAGNOSIS — E440 Moderate protein-calorie malnutrition: Secondary | ICD-10-CM | POA: Diagnosis not present

## 2015-09-18 DIAGNOSIS — N186 End stage renal disease: Secondary | ICD-10-CM | POA: Diagnosis not present

## 2015-09-20 DIAGNOSIS — D630 Anemia in neoplastic disease: Secondary | ICD-10-CM | POA: Diagnosis not present

## 2015-09-20 DIAGNOSIS — D509 Iron deficiency anemia, unspecified: Secondary | ICD-10-CM | POA: Diagnosis not present

## 2015-09-20 DIAGNOSIS — Z7689 Persons encountering health services in other specified circumstances: Secondary | ICD-10-CM | POA: Diagnosis not present

## 2015-09-20 DIAGNOSIS — N186 End stage renal disease: Secondary | ICD-10-CM | POA: Diagnosis not present

## 2015-09-20 DIAGNOSIS — E875 Hyperkalemia: Secondary | ICD-10-CM | POA: Diagnosis not present

## 2015-09-20 DIAGNOSIS — E440 Moderate protein-calorie malnutrition: Secondary | ICD-10-CM | POA: Diagnosis not present

## 2015-09-22 DIAGNOSIS — E875 Hyperkalemia: Secondary | ICD-10-CM | POA: Diagnosis not present

## 2015-09-22 DIAGNOSIS — D630 Anemia in neoplastic disease: Secondary | ICD-10-CM | POA: Diagnosis not present

## 2015-09-22 DIAGNOSIS — D509 Iron deficiency anemia, unspecified: Secondary | ICD-10-CM | POA: Diagnosis not present

## 2015-09-22 DIAGNOSIS — N186 End stage renal disease: Secondary | ICD-10-CM | POA: Diagnosis not present

## 2015-09-22 DIAGNOSIS — Z7689 Persons encountering health services in other specified circumstances: Secondary | ICD-10-CM | POA: Diagnosis not present

## 2015-09-22 DIAGNOSIS — E440 Moderate protein-calorie malnutrition: Secondary | ICD-10-CM | POA: Diagnosis not present

## 2015-09-23 DIAGNOSIS — Z7689 Persons encountering health services in other specified circumstances: Secondary | ICD-10-CM | POA: Diagnosis not present

## 2015-09-23 DIAGNOSIS — N186 End stage renal disease: Secondary | ICD-10-CM | POA: Diagnosis not present

## 2015-09-23 DIAGNOSIS — E875 Hyperkalemia: Secondary | ICD-10-CM | POA: Diagnosis not present

## 2015-09-23 DIAGNOSIS — D509 Iron deficiency anemia, unspecified: Secondary | ICD-10-CM | POA: Diagnosis not present

## 2015-09-23 DIAGNOSIS — D630 Anemia in neoplastic disease: Secondary | ICD-10-CM | POA: Diagnosis not present

## 2015-09-23 DIAGNOSIS — E440 Moderate protein-calorie malnutrition: Secondary | ICD-10-CM | POA: Diagnosis not present

## 2015-09-24 DIAGNOSIS — Z7689 Persons encountering health services in other specified circumstances: Secondary | ICD-10-CM | POA: Diagnosis not present

## 2015-09-24 DIAGNOSIS — N186 End stage renal disease: Secondary | ICD-10-CM | POA: Diagnosis not present

## 2015-09-24 DIAGNOSIS — E440 Moderate protein-calorie malnutrition: Secondary | ICD-10-CM | POA: Diagnosis not present

## 2015-09-24 DIAGNOSIS — D509 Iron deficiency anemia, unspecified: Secondary | ICD-10-CM | POA: Diagnosis not present

## 2015-09-24 DIAGNOSIS — D630 Anemia in neoplastic disease: Secondary | ICD-10-CM | POA: Diagnosis not present

## 2015-09-24 DIAGNOSIS — E875 Hyperkalemia: Secondary | ICD-10-CM | POA: Diagnosis not present

## 2015-09-25 DIAGNOSIS — E440 Moderate protein-calorie malnutrition: Secondary | ICD-10-CM | POA: Diagnosis not present

## 2015-09-25 DIAGNOSIS — D509 Iron deficiency anemia, unspecified: Secondary | ICD-10-CM | POA: Diagnosis not present

## 2015-09-25 DIAGNOSIS — D630 Anemia in neoplastic disease: Secondary | ICD-10-CM | POA: Diagnosis not present

## 2015-09-25 DIAGNOSIS — E875 Hyperkalemia: Secondary | ICD-10-CM | POA: Diagnosis not present

## 2015-09-25 DIAGNOSIS — N186 End stage renal disease: Secondary | ICD-10-CM | POA: Diagnosis not present

## 2015-09-25 DIAGNOSIS — Z7689 Persons encountering health services in other specified circumstances: Secondary | ICD-10-CM | POA: Diagnosis not present

## 2015-09-26 ENCOUNTER — Telehealth: Payer: Self-pay | Admitting: *Deleted

## 2015-09-26 DIAGNOSIS — E440 Moderate protein-calorie malnutrition: Secondary | ICD-10-CM | POA: Diagnosis not present

## 2015-09-26 DIAGNOSIS — Z7689 Persons encountering health services in other specified circumstances: Secondary | ICD-10-CM | POA: Diagnosis not present

## 2015-09-26 DIAGNOSIS — D509 Iron deficiency anemia, unspecified: Secondary | ICD-10-CM | POA: Diagnosis not present

## 2015-09-26 DIAGNOSIS — E875 Hyperkalemia: Secondary | ICD-10-CM | POA: Diagnosis not present

## 2015-09-26 DIAGNOSIS — T82510D Breakdown (mechanical) of surgically created arteriovenous fistula, subsequent encounter: Secondary | ICD-10-CM

## 2015-09-26 DIAGNOSIS — D630 Anemia in neoplastic disease: Secondary | ICD-10-CM | POA: Diagnosis not present

## 2015-09-26 DIAGNOSIS — N186 End stage renal disease: Secondary | ICD-10-CM | POA: Diagnosis not present

## 2015-09-26 NOTE — Telephone Encounter (Signed)
Patient called to report that he is still having occasional bleeding from the venous side of his access similar to what he had in January; Dr. Bridgett Larsson discussed options at that visit;   Options to help with bleeding after cannulation include: venoplasty of proximal segment, cephalic vein turndown to axillary vein and shortening, straightening, plicating entire fistula.  Will get venous duplex to help determine which option to proceed with  The patient does not have a TDC at this time. Will have our office call Christopher Ritter back with appt time for venous studies and office appt to discuss surgical options. Patient is in agreement.

## 2015-09-27 DIAGNOSIS — E440 Moderate protein-calorie malnutrition: Secondary | ICD-10-CM | POA: Diagnosis not present

## 2015-09-27 DIAGNOSIS — N186 End stage renal disease: Secondary | ICD-10-CM | POA: Diagnosis not present

## 2015-09-27 DIAGNOSIS — Z7689 Persons encountering health services in other specified circumstances: Secondary | ICD-10-CM | POA: Diagnosis not present

## 2015-09-27 DIAGNOSIS — D509 Iron deficiency anemia, unspecified: Secondary | ICD-10-CM | POA: Diagnosis not present

## 2015-09-27 DIAGNOSIS — E875 Hyperkalemia: Secondary | ICD-10-CM | POA: Diagnosis not present

## 2015-09-27 DIAGNOSIS — D630 Anemia in neoplastic disease: Secondary | ICD-10-CM | POA: Diagnosis not present

## 2015-09-29 DIAGNOSIS — D630 Anemia in neoplastic disease: Secondary | ICD-10-CM | POA: Diagnosis not present

## 2015-09-29 DIAGNOSIS — Z7689 Persons encountering health services in other specified circumstances: Secondary | ICD-10-CM | POA: Diagnosis not present

## 2015-09-29 DIAGNOSIS — E875 Hyperkalemia: Secondary | ICD-10-CM | POA: Diagnosis not present

## 2015-09-29 DIAGNOSIS — D509 Iron deficiency anemia, unspecified: Secondary | ICD-10-CM | POA: Diagnosis not present

## 2015-09-29 DIAGNOSIS — E440 Moderate protein-calorie malnutrition: Secondary | ICD-10-CM | POA: Diagnosis not present

## 2015-09-29 DIAGNOSIS — N186 End stage renal disease: Secondary | ICD-10-CM | POA: Diagnosis not present

## 2015-09-30 DIAGNOSIS — Z7689 Persons encountering health services in other specified circumstances: Secondary | ICD-10-CM | POA: Diagnosis not present

## 2015-09-30 DIAGNOSIS — D509 Iron deficiency anemia, unspecified: Secondary | ICD-10-CM | POA: Diagnosis not present

## 2015-09-30 DIAGNOSIS — E440 Moderate protein-calorie malnutrition: Secondary | ICD-10-CM | POA: Diagnosis not present

## 2015-09-30 DIAGNOSIS — J0100 Acute maxillary sinusitis, unspecified: Secondary | ICD-10-CM | POA: Diagnosis not present

## 2015-09-30 DIAGNOSIS — D630 Anemia in neoplastic disease: Secondary | ICD-10-CM | POA: Diagnosis not present

## 2015-09-30 DIAGNOSIS — E875 Hyperkalemia: Secondary | ICD-10-CM | POA: Diagnosis not present

## 2015-09-30 DIAGNOSIS — N186 End stage renal disease: Secondary | ICD-10-CM | POA: Diagnosis not present

## 2015-09-30 DIAGNOSIS — J209 Acute bronchitis, unspecified: Secondary | ICD-10-CM | POA: Diagnosis not present

## 2015-10-03 DIAGNOSIS — N08 Glomerular disorders in diseases classified elsewhere: Secondary | ICD-10-CM | POA: Diagnosis not present

## 2015-10-03 DIAGNOSIS — Z992 Dependence on renal dialysis: Secondary | ICD-10-CM | POA: Diagnosis not present

## 2015-10-03 DIAGNOSIS — N186 End stage renal disease: Secondary | ICD-10-CM | POA: Diagnosis not present

## 2015-10-05 DIAGNOSIS — N186 End stage renal disease: Secondary | ICD-10-CM | POA: Diagnosis not present

## 2015-10-05 DIAGNOSIS — Z4931 Encounter for adequacy testing for hemodialysis: Secondary | ICD-10-CM | POA: Diagnosis not present

## 2015-10-05 DIAGNOSIS — E8770 Fluid overload, unspecified: Secondary | ICD-10-CM | POA: Diagnosis not present

## 2015-10-05 DIAGNOSIS — D630 Anemia in neoplastic disease: Secondary | ICD-10-CM | POA: Diagnosis not present

## 2015-10-05 DIAGNOSIS — D631 Anemia in chronic kidney disease: Secondary | ICD-10-CM | POA: Diagnosis not present

## 2015-10-05 DIAGNOSIS — E440 Moderate protein-calorie malnutrition: Secondary | ICD-10-CM | POA: Diagnosis not present

## 2015-10-05 DIAGNOSIS — Z79899 Other long term (current) drug therapy: Secondary | ICD-10-CM | POA: Diagnosis not present

## 2015-10-05 DIAGNOSIS — N2581 Secondary hyperparathyroidism of renal origin: Secondary | ICD-10-CM | POA: Diagnosis not present

## 2015-10-05 DIAGNOSIS — N2589 Other disorders resulting from impaired renal tubular function: Secondary | ICD-10-CM | POA: Diagnosis not present

## 2015-10-05 DIAGNOSIS — D509 Iron deficiency anemia, unspecified: Secondary | ICD-10-CM | POA: Diagnosis not present

## 2015-10-07 DIAGNOSIS — D509 Iron deficiency anemia, unspecified: Secondary | ICD-10-CM | POA: Diagnosis not present

## 2015-10-07 DIAGNOSIS — N186 End stage renal disease: Secondary | ICD-10-CM | POA: Diagnosis not present

## 2015-10-07 DIAGNOSIS — Z4931 Encounter for adequacy testing for hemodialysis: Secondary | ICD-10-CM | POA: Diagnosis not present

## 2015-10-07 DIAGNOSIS — D630 Anemia in neoplastic disease: Secondary | ICD-10-CM | POA: Diagnosis not present

## 2015-10-07 DIAGNOSIS — N2581 Secondary hyperparathyroidism of renal origin: Secondary | ICD-10-CM | POA: Diagnosis not present

## 2015-10-07 DIAGNOSIS — D631 Anemia in chronic kidney disease: Secondary | ICD-10-CM | POA: Diagnosis not present

## 2015-10-09 DIAGNOSIS — N2581 Secondary hyperparathyroidism of renal origin: Secondary | ICD-10-CM | POA: Diagnosis not present

## 2015-10-09 DIAGNOSIS — E874 Mixed disorder of acid-base balance: Secondary | ICD-10-CM | POA: Diagnosis not present

## 2015-10-09 DIAGNOSIS — D630 Anemia in neoplastic disease: Secondary | ICD-10-CM | POA: Diagnosis not present

## 2015-10-09 DIAGNOSIS — D631 Anemia in chronic kidney disease: Secondary | ICD-10-CM | POA: Diagnosis not present

## 2015-10-09 DIAGNOSIS — N186 End stage renal disease: Secondary | ICD-10-CM | POA: Diagnosis not present

## 2015-10-09 DIAGNOSIS — D509 Iron deficiency anemia, unspecified: Secondary | ICD-10-CM | POA: Diagnosis not present

## 2015-10-09 DIAGNOSIS — Z4931 Encounter for adequacy testing for hemodialysis: Secondary | ICD-10-CM | POA: Diagnosis not present

## 2015-10-11 DIAGNOSIS — N186 End stage renal disease: Secondary | ICD-10-CM | POA: Diagnosis not present

## 2015-10-11 DIAGNOSIS — N2581 Secondary hyperparathyroidism of renal origin: Secondary | ICD-10-CM | POA: Diagnosis not present

## 2015-10-11 DIAGNOSIS — D630 Anemia in neoplastic disease: Secondary | ICD-10-CM | POA: Diagnosis not present

## 2015-10-11 DIAGNOSIS — Z4931 Encounter for adequacy testing for hemodialysis: Secondary | ICD-10-CM | POA: Diagnosis not present

## 2015-10-11 DIAGNOSIS — D631 Anemia in chronic kidney disease: Secondary | ICD-10-CM | POA: Diagnosis not present

## 2015-10-11 DIAGNOSIS — D509 Iron deficiency anemia, unspecified: Secondary | ICD-10-CM | POA: Diagnosis not present

## 2015-10-13 ENCOUNTER — Telehealth: Payer: Self-pay | Admitting: Vascular Surgery

## 2015-10-13 NOTE — Telephone Encounter (Signed)
sched appt on 4/11, lab 10:30, md 11:15. Spoke to pt to inform them of appt.

## 2015-10-13 NOTE — Telephone Encounter (Signed)
-----   Message from Denman George, RN sent at 10/09/2015  4:35 PM EDT ----- Regarding: FYI/ needs an office appt. to evaluate problems with (L) Arm AVF See Kay's message below; with [facility] being out of office the next 2 weeks, need to bring him in to be evaluated. I left a voice message for pt. To call office on Friday AM re: an appt.  ________________________________________________   Mena Goes, RN at 09/26/2015 1:38 PM    Status: Signed      Expand All Collapse All    Patient called to report that he is still having occasional bleeding from the venous side of his access similar to what he had in January; Dr. Bridgett Larsson discussed options at that visit;      Options to help with bleeding after cannulation include: venoplasty of proximal segment, cephalic vein turndown to axillary vein and shortening, straightening, plicating entire fistula.     Will get venous duplex to help determine which option to proceed with  The patient does not have a TDC at this time. Will have our office call Mr. Krupa back with appt time for venous studies and office appt to discuss surgical options. Patient is in agreement.

## 2015-10-14 ENCOUNTER — Inpatient Hospital Stay (HOSPITAL_COMMUNITY)
Admission: RE | Admit: 2015-10-14 | Discharge: 2015-10-14 | Disposition: A | Discharge status: Home / Self Care | Payer: Medicare Other | Source: Ambulatory Visit | Attending: Family | Admitting: Family

## 2015-10-14 ENCOUNTER — Ambulatory Visit: Payer: Medicare Other | Admitting: Family

## 2015-10-14 ENCOUNTER — Encounter: Payer: Self-pay | Admitting: *Deleted

## 2015-10-14 ENCOUNTER — Encounter: Payer: Self-pay | Admitting: Family

## 2015-10-14 ENCOUNTER — Ambulatory Visit (INDEPENDENT_AMBULATORY_CARE_PROVIDER_SITE_OTHER): Payer: Medicare Other | Admitting: Family

## 2015-10-14 VITALS — BP 138/82 | HR 82 | Temp 98.2°F | Resp 16 | Ht 74.0 in | Wt 148.0 lb

## 2015-10-14 DIAGNOSIS — N186 End stage renal disease: Secondary | ICD-10-CM | POA: Diagnosis not present

## 2015-10-14 DIAGNOSIS — T82510D Breakdown (mechanical) of surgically created arteriovenous fistula, subsequent encounter: Secondary | ICD-10-CM

## 2015-10-14 DIAGNOSIS — Z992 Dependence on renal dialysis: Secondary | ICD-10-CM | POA: Diagnosis not present

## 2015-10-14 DIAGNOSIS — T82590A Other mechanical complication of surgically created arteriovenous fistula, initial encounter: Secondary | ICD-10-CM | POA: Diagnosis not present

## 2015-10-14 DIAGNOSIS — D630 Anemia in neoplastic disease: Secondary | ICD-10-CM | POA: Diagnosis not present

## 2015-10-14 DIAGNOSIS — Z4931 Encounter for adequacy testing for hemodialysis: Secondary | ICD-10-CM | POA: Diagnosis not present

## 2015-10-14 DIAGNOSIS — D631 Anemia in chronic kidney disease: Secondary | ICD-10-CM | POA: Diagnosis not present

## 2015-10-14 DIAGNOSIS — N2581 Secondary hyperparathyroidism of renal origin: Secondary | ICD-10-CM | POA: Diagnosis not present

## 2015-10-14 DIAGNOSIS — D509 Iron deficiency anemia, unspecified: Secondary | ICD-10-CM | POA: Diagnosis not present

## 2015-10-14 NOTE — Progress Notes (Signed)
Established Dialysis Access  History of Present Illness  Christopher Ritter is a 45 y.o. (1970-11-24) male patient of Christopher Ritter and Christopher Ritter who is s/p plication of LUA AVF (Date: 07/02/15). The patient's wounds are healed. The patient notes no steal symptoms. The patient is able to complete their activities of daily living. The patient's current symptoms are: excessively long bleeding after cannulation.  He thinks his original AVF was placed in 2007-2010.   Christopher Ritter last saw pt on1/27/17. At that time pat had residual venous stenosis. The patient's access was ready for use. Options to help with bleeding after cannulation include: venoplasty of proximal segment, cephalic vein turndown to axillary vein and shortening, straightening, plicating entire fistula. Will get venous duplex to help determine which option to proceed with. LUE venous duplex (08/01/2015): patent L axillary vein, 7.5 mm At that point, the patient declined any further interventions, but is considering the cephalic vein turndown if needed.  He returns today with c/o bleeding in the venous part of the AVF with accessing in HD and with withdrawing of the needle. He finds that he has to hold pressure on the venous site for 15 minutes to achieve hemostasis; at the arterial site 5 seconds of pressure achieved hemostasis.  He denies steal sx's in left hand. He is right hand dominant.    The patient has not had a previous PPM placed.  Past Medical History  Diagnosis Date  . Hypertension   . Chronic kidney disease   . Chronic headaches     posterior occipital headaches  . History of renal calculi   . Allergy   . Anemia     Iron deficiency  . Thyroid disease   . History of Cytoxan exposure   . GERD (gastroesophageal reflux disease)     hx of- no meds currently  . Coronary artery disease     Mitral Valve Prolapse- per Cardiologist Christopher Ritter  . Constipation   . Shortness of breath dyspnea     shortness of breath at  times related to dialysis  . Heart murmur     Mitral Valveprolasp    Social History Social History  Substance Use Topics  . Smoking status: Never Smoker   . Smokeless tobacco: Never Used  . Alcohol Use: No    Family History Family History  Problem Relation Age of Onset  . Anesthesia problems Neg Hx     Surgical History Past Surgical History  Procedure Laterality Date  . Av fistula placement    . Diatek placed and removed      UNTIL FISTULA MATURED  . Thrombectomy w/ embolectomy  07/08/2011    Procedure: THROMBECTOMY ARTERIOVENOUS FISTULA;  Surgeon: Mal Misty, MD;  Location: [place];  Service: Vascular;  Laterality: Left;  Incision of Ulcerated Skin and Revision of Left Upper Arm Fistula  . Revison of arteriovenous fistula Left 11/28/2012    Procedure: REVISON OF ARTERIOVENOUS FISTULA;  Surgeon: Elam Dutch, MD;  Location: [facility];  Service: Vascular;  Laterality: Left;  Plication   . Peripheral vascular catheterization N/A 03/20/2015    Procedure: A/V Fistulagram;  Surgeon: Conrad Peoria, MD;  Location: [facility];  Service: Cardiovascular;  Laterality: N/A;  . Revison of arteriovenous fistula Left 07/02/2015    Procedure: Pseudoaneurysm Repair of Left Upper Arm Fistula;  Surgeon: Conrad La Cygne, MD;  Location: [facility];  Service: Vascular;  Laterality: Left;    Allergies  Allergen Reactions  . Lisinopril  Cough    Current Outpatient Prescriptions  Medication Sig Dispense Refill  . amLODipine (NORVASC) 10 MG tablet Take 10 mg by mouth daily.  12  . calcitRIOL (ROCALTROL) 0.25 MCG capsule Take 0.25 mcg by mouth daily.    . calcium carbonate (TUMS EX) 750 MG chewable tablet Chew 2-3 tablets by mouth 3 (three) times daily with meals. Chew 3 tablets by mouth 3 times daily with meals and chew 2 tablets by mouth with snacks.    . carvedilol (COREG) 6.25 MG tablet Take 6.25 mg by mouth as needed (Only take when systolic is 123XX123 or higher).   5  . cetirizine (ZYRTEC) 10 MG  tablet Take 10 mg by mouth daily as needed for allergies.     Marland Kitchen docusate sodium (STOOL SOFTENER) 100 MG capsule Take 100 mg by mouth daily as needed for mild constipation.    Marland Kitchen ethyl chloride spray     . HYDROcodone-acetaminophen (NORCO) 10-325 MG tablet Take 1 tablet by mouth every 4 (four) hours as needed for moderate pain. 20 tablet 0  . multivitamin (RENA-VIT) TABS tablet Take 1 tablet by mouth daily.  10  . PROAIR HFA 108 (90 Base) MCG/ACT inhaler INHALE 2 PUFFS 4 TIMES A DAY  0  . promethazine (PHENERGAN) 25 MG tablet Take 25 mg by mouth every 8 (eight) hours as needed for nausea or vomiting.    . ranitidine (ZANTAC) 75 MG tablet Take 75 mg by mouth as needed for heartburn.    . SENSIPAR 30 MG tablet Take 30 mg by mouth daily.     . simvastatin (ZOCOR) 20 MG tablet Take 20 mg by mouth at bedtime.      No current facility-administered medications for this visit.     REVIEW OF SYSTEMS: see HPI for pertinent positives and negatives    PHYSICAL EXAMINATION:  Filed Vitals:   10/14/15 1116  BP: 138/82  Pulse: 82  Temp: 98.2 F (36.8 C)  TempSrc: Oral  Resp: 16  Height: 6\' 2"  (1.88 m)  Weight: 148 lb (67.132 kg)  SpO2: 99%   Body mass index is 18.99 kg/(m^2).  General: The patient appears their stated age.   HEENT:  No gross abnormalities Pulmonary: Respirations are non-labored Abdomen: Soft and non-tender with normal pitched BS. Musculoskeletal: There are no major deformities.   Neurologic: No focal weakness or paresthesias are detected, Skin: There are no ulcer or rashes noted. Psychiatric: The patient has normal affect. Cardiovascular: There is a regular rate and rhythm without significant murmur appreciated. Left arm AVF is aneurysmal with palpable thrill. Left radial pulse is 2+ palpable.  Non-Invasive Vascular Imaging  LUE venous duplex (08/01/2015): patent L axillary vein, 7.5 mm    Medical Decision Making  Christopher Ritter is a 45 y.o. male who is s/p plication  of LUA AVF (Date: 07/02/15). The patient's current symptoms are: excessively long bleeding after cannulation.  He thinks his original AVF was placed in 2007-2010.   Pt's AVF does not bleed other than with HD access and needle removal. He finds that he has to hold pressure on the venous site for 15 minutes to achieve hemostasis; at the arterial site 5 seconds of pressure achieved hemostasis.   Pt performs HD at home on M-W-F and every other Saturday. Will schedule pt to return on 10/22/15 and discuss with Christopher Ritter.   Andie Mungin, Sharmon Leyden, RN, MSN, FNP-C [facility] Office: [phone]  10/14/2015, 11:34 AM  Clinic MD: Scot Dock  on call

## 2015-10-15 ENCOUNTER — Encounter: Payer: Self-pay | Admitting: Vascular Surgery

## 2015-10-16 DIAGNOSIS — N2581 Secondary hyperparathyroidism of renal origin: Secondary | ICD-10-CM | POA: Diagnosis not present

## 2015-10-16 DIAGNOSIS — D631 Anemia in chronic kidney disease: Secondary | ICD-10-CM | POA: Diagnosis not present

## 2015-10-16 DIAGNOSIS — D630 Anemia in neoplastic disease: Secondary | ICD-10-CM | POA: Diagnosis not present

## 2015-10-16 DIAGNOSIS — N186 End stage renal disease: Secondary | ICD-10-CM | POA: Diagnosis not present

## 2015-10-16 DIAGNOSIS — Z4931 Encounter for adequacy testing for hemodialysis: Secondary | ICD-10-CM | POA: Diagnosis not present

## 2015-10-16 DIAGNOSIS — D509 Iron deficiency anemia, unspecified: Secondary | ICD-10-CM | POA: Diagnosis not present

## 2015-10-18 DIAGNOSIS — Z4931 Encounter for adequacy testing for hemodialysis: Secondary | ICD-10-CM | POA: Diagnosis not present

## 2015-10-18 DIAGNOSIS — D630 Anemia in neoplastic disease: Secondary | ICD-10-CM | POA: Diagnosis not present

## 2015-10-18 DIAGNOSIS — D631 Anemia in chronic kidney disease: Secondary | ICD-10-CM | POA: Diagnosis not present

## 2015-10-18 DIAGNOSIS — N186 End stage renal disease: Secondary | ICD-10-CM | POA: Diagnosis not present

## 2015-10-18 DIAGNOSIS — N2581 Secondary hyperparathyroidism of renal origin: Secondary | ICD-10-CM | POA: Diagnosis not present

## 2015-10-18 DIAGNOSIS — D509 Iron deficiency anemia, unspecified: Secondary | ICD-10-CM | POA: Diagnosis not present

## 2015-10-20 DIAGNOSIS — D509 Iron deficiency anemia, unspecified: Secondary | ICD-10-CM | POA: Diagnosis not present

## 2015-10-20 DIAGNOSIS — N186 End stage renal disease: Secondary | ICD-10-CM | POA: Diagnosis not present

## 2015-10-20 DIAGNOSIS — D630 Anemia in neoplastic disease: Secondary | ICD-10-CM | POA: Diagnosis not present

## 2015-10-20 DIAGNOSIS — Z4931 Encounter for adequacy testing for hemodialysis: Secondary | ICD-10-CM | POA: Diagnosis not present

## 2015-10-20 DIAGNOSIS — D631 Anemia in chronic kidney disease: Secondary | ICD-10-CM | POA: Diagnosis not present

## 2015-10-20 DIAGNOSIS — N2581 Secondary hyperparathyroidism of renal origin: Secondary | ICD-10-CM | POA: Diagnosis not present

## 2015-10-21 DIAGNOSIS — N186 End stage renal disease: Secondary | ICD-10-CM | POA: Diagnosis not present

## 2015-10-21 DIAGNOSIS — N2581 Secondary hyperparathyroidism of renal origin: Secondary | ICD-10-CM | POA: Diagnosis not present

## 2015-10-21 DIAGNOSIS — D509 Iron deficiency anemia, unspecified: Secondary | ICD-10-CM | POA: Diagnosis not present

## 2015-10-21 DIAGNOSIS — Z4931 Encounter for adequacy testing for hemodialysis: Secondary | ICD-10-CM | POA: Diagnosis not present

## 2015-10-21 DIAGNOSIS — D631 Anemia in chronic kidney disease: Secondary | ICD-10-CM | POA: Diagnosis not present

## 2015-10-21 DIAGNOSIS — D630 Anemia in neoplastic disease: Secondary | ICD-10-CM | POA: Diagnosis not present

## 2015-10-22 ENCOUNTER — Encounter: Payer: Self-pay | Admitting: Vascular Surgery

## 2015-10-22 ENCOUNTER — Ambulatory Visit (INDEPENDENT_AMBULATORY_CARE_PROVIDER_SITE_OTHER): Payer: Medicare Other | Admitting: Vascular Surgery

## 2015-10-22 VITALS — BP 141/88 | HR 88 | Ht 74.0 in | Wt 146.3 lb

## 2015-10-22 DIAGNOSIS — Z992 Dependence on renal dialysis: Secondary | ICD-10-CM | POA: Diagnosis not present

## 2015-10-22 DIAGNOSIS — I871 Compression of vein: Secondary | ICD-10-CM

## 2015-10-22 DIAGNOSIS — N186 End stage renal disease: Secondary | ICD-10-CM | POA: Diagnosis not present

## 2015-10-22 NOTE — Progress Notes (Signed)
Established Dialysis Access  History of Present Illness  Christopher Ritter is a 45 y.o. ([birth year]) male who present cc: extended bleeding from L BC AVF.  This patient has a known aneurysmal L BC AVF that has undergone multiple plications.  He notes he is having extended bleeding from his cannulation site proximally and elevated pressure on the venous cannula.  Past Medical History  Diagnosis Date  . Hypertension   . Chronic kidney disease   . Chronic headaches     posterior occipital headaches  . History of renal calculi   . Allergy   . Anemia     Iron deficiency  . Thyroid disease   . History of Cytoxan exposure   . GERD (gastroesophageal reflux disease)     hx of- no meds currently  . Coronary artery disease     Mitral Valve Prolapse- per Cardiologist Dr. Bettina Gavia  . Constipation   . Shortness of breath dyspnea     shortness of breath at times related to dialysis  . Heart murmur     Mitral Valveprolasp    Past Surgical History  Procedure Laterality Date  . Av fistula placement    . Diatek placed and removed      UNTIL FISTULA MATURED  . Thrombectomy w/ embolectomy  07/08/2011    Procedure: THROMBECTOMY ARTERIOVENOUS FISTULA;  Surgeon: Mal Misty, MD;  Location: [place];  Service: Vascular;  Laterality: Left;  Incision of Ulcerated Skin and Revision of Left Upper Arm Fistula  . Revison of arteriovenous fistula Left 11/28/2012    Procedure: REVISON OF ARTERIOVENOUS FISTULA;  Surgeon: Elam Dutch, MD;  Location: [place];  Service: Vascular;  Laterality: Left;  Plication   . Peripheral vascular catheterization N/A 03/20/2015    Procedure: A/V Fistulagram;  Surgeon: Conrad Mammoth, MD;  Location: [facility];  Service: Cardiovascular;  Laterality: N/A;  . Revison of arteriovenous fistula Left 07/02/2015    Procedure: Pseudoaneurysm Repair of Left Upper Arm Fistula;  Surgeon: Conrad , MD;  Location: [facility];  Service: Vascular;  Laterality: Left;    Social History     Social History  . Marital Status: Married    Spouse Name: N/A  . Number of Children: N/A  . Years of Education: N/A   Occupational History  . Not on file.   Social History Main Topics  . Smoking status: Never Smoker   . Smokeless tobacco: Never Used  . Alcohol Use: No  . Drug Use: No  . Sexual Activity: Not on file   Other Topics Concern  . Not on file   Social History Narrative    Family History  Problem Relation Age of Onset  . Anesthesia problems Neg Hx     Current Outpatient Prescriptions  Medication Sig Dispense Refill  . amLODipine (NORVASC) 10 MG tablet Take 10 mg by mouth daily.  12  . calcitRIOL (ROCALTROL) 0.25 MCG capsule Take 0.25 mcg by mouth daily.    . calcium carbonate (TUMS EX) 750 MG chewable tablet Chew 2-3 tablets by mouth 3 (three) times daily with meals. Chew 3 tablets by mouth 3 times daily with meals and chew 2 tablets by mouth with snacks.    . carvedilol (COREG) 6.25 MG tablet Take 6.25 mg by mouth as needed (Only take when systolic is 123XX123 or higher).   5  . cetirizine (ZYRTEC) 10 MG tablet Take 10 mg by mouth daily as needed for allergies.     Marland Kitchen  docusate sodium (STOOL SOFTENER) 100 MG capsule Take 100 mg by mouth daily as needed for mild constipation.    Marland Kitchen ethyl chloride spray     . HYDROcodone-acetaminophen (NORCO) 10-325 MG tablet Take 1 tablet by mouth every 4 (four) hours as needed for moderate pain. 20 tablet 0  . multivitamin (RENA-VIT) TABS tablet Take 1 tablet by mouth daily.  10  . PROAIR HFA 108 (90 Base) MCG/ACT inhaler INHALE 2 PUFFS 4 TIMES A DAY  0  . promethazine (PHENERGAN) 25 MG tablet Take 25 mg by mouth every 8 (eight) hours as needed for nausea or vomiting.    . ranitidine (ZANTAC) 75 MG tablet Take 75 mg by mouth as needed for heartburn.    . SENSIPAR 30 MG tablet Take 30 mg by mouth daily.     . simvastatin (ZOCOR) 20 MG tablet Take 20 mg by mouth at bedtime.      No current facility-administered medications for this  visit.     Allergies  Allergen Reactions  . Lisinopril Cough     REVIEW OF SYSTEMS:  (Positives checked otherwise negative)  CARDIOVASCULAR:   [ ]  chest pain,  [ ]  chest pressure,  [ ]  palpitations,  [ ]  shortness of breath when laying flat,  [ ]  shortness of breath with exertion,   [ ]  pain in feet when walking,  [ ]  pain in feet when laying flat, [ ]  history of blood clot in veins (DVT),  [ ]  history of phlebitis,  [ ]  swelling in legs,  [ ]  varicose veins  PULMONARY:   [ ]  productive cough,  [ ]  asthma,  [ ]  wheezing  NEUROLOGIC:   [ ]  weakness in arms or legs,  [ ]  numbness in arms or legs,  [ ]  difficulty speaking or slurred speech,  [ ]  temporary loss of vision in one eye,  [ ]  dizziness  HEMATOLOGIC:   [ ]  bleeding problems,  [ ]  problems with blood clotting too easily  MUSCULOSKEL:   [ ]  joint pain, [ ]  joint swelling  GASTROINTEST:   [ ]  vomiting blood,  [ ]  blood in stool     GENITOURINARY:   [ ]  burning with urination,  [ ]  blood in urine [x]  ESRD-home HD  PSYCHIATRIC:   [ ]  history of major depression  INTEGUMENTARY:   [ ]  rashes,  [ ]  ulcers  CONSTITUTIONAL:   [ ]  fever,  [ ]  chills     Physical Examination  Filed Vitals:   10/22/15 1259 10/22/15 1301  BP: 145/91 141/88  Pulse: 88   Height: 6\' 2"  (1.88 m)   Weight: 146 lb 4.8 oz (66.361 kg)   SpO2: 100%    Body mass index is 18.78 kg/(m^2).  General: A&O x 3, WD, WN  Pulmonary: Sym exp, good air movt, CTAB, no rales, rhonchi, & wheezing  Cardiac: RRR, Nl S1, S2, no Murmurs, rubs or gallops  Vascular: Vessel Right Left  Radial Palpable Palpable  Ulnar Not Palpable Not Palpable  Brachial Palpable Palpable   Gastrointestinal: soft, NTND, no G/R, bo HSM, no masses, no CVAT B  Musculoskeletal: M/S 5/5 throughout , Extremities without  ischemic changes , palpable thrill in access in L UA, bruit in access, aneurysmal distal 3/5 of L BC AVF, no particularly worrisome  segment, dilated proximal cephalic vein evident down to clavicle  Neurologic: Pain and light touch intact in extremities , Motor exam as listed above   Medical Decision Making  Christopher Ritter is a 45 y.o. 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  This might help relieve the venous stenosis, but can't be certain as the proximal vein on prior fistulogram was already 7 mm.  I doubt venoplasty of have any long-term patency, so would proceed with the turndown as a salvage maneuver.  The other option is ligation of the fistula and placement of a new access, but event the long-term patency of this fistula, I am reluctant to do such as is the patient.  At this point, the patient wants to hold off on proceeding with the cepahlic vein turndown.   Adele Barthel, MD [facility] Office: [phone] Pager: [phone]  10/22/2015, 4:57 PM

## 2015-10-23 DIAGNOSIS — D631 Anemia in chronic kidney disease: Secondary | ICD-10-CM | POA: Diagnosis not present

## 2015-10-23 DIAGNOSIS — D509 Iron deficiency anemia, unspecified: Secondary | ICD-10-CM | POA: Diagnosis not present

## 2015-10-23 DIAGNOSIS — N2581 Secondary hyperparathyroidism of renal origin: Secondary | ICD-10-CM | POA: Diagnosis not present

## 2015-10-23 DIAGNOSIS — N186 End stage renal disease: Secondary | ICD-10-CM | POA: Diagnosis not present

## 2015-10-23 DIAGNOSIS — D630 Anemia in neoplastic disease: Secondary | ICD-10-CM | POA: Diagnosis not present

## 2015-10-23 DIAGNOSIS — Z4931 Encounter for adequacy testing for hemodialysis: Secondary | ICD-10-CM | POA: Diagnosis not present

## 2015-10-25 DIAGNOSIS — D631 Anemia in chronic kidney disease: Secondary | ICD-10-CM | POA: Diagnosis not present

## 2015-10-25 DIAGNOSIS — Z4931 Encounter for adequacy testing for hemodialysis: Secondary | ICD-10-CM | POA: Diagnosis not present

## 2015-10-25 DIAGNOSIS — D509 Iron deficiency anemia, unspecified: Secondary | ICD-10-CM | POA: Diagnosis not present

## 2015-10-25 DIAGNOSIS — D630 Anemia in neoplastic disease: Secondary | ICD-10-CM | POA: Diagnosis not present

## 2015-10-25 DIAGNOSIS — N2581 Secondary hyperparathyroidism of renal origin: Secondary | ICD-10-CM | POA: Diagnosis not present

## 2015-10-25 DIAGNOSIS — N186 End stage renal disease: Secondary | ICD-10-CM | POA: Diagnosis not present

## 2015-10-27 DIAGNOSIS — N186 End stage renal disease: Secondary | ICD-10-CM | POA: Diagnosis not present

## 2015-10-27 DIAGNOSIS — Z4931 Encounter for adequacy testing for hemodialysis: Secondary | ICD-10-CM | POA: Diagnosis not present

## 2015-10-27 DIAGNOSIS — D631 Anemia in chronic kidney disease: Secondary | ICD-10-CM | POA: Diagnosis not present

## 2015-10-27 DIAGNOSIS — D630 Anemia in neoplastic disease: Secondary | ICD-10-CM | POA: Diagnosis not present

## 2015-10-27 DIAGNOSIS — N2581 Secondary hyperparathyroidism of renal origin: Secondary | ICD-10-CM | POA: Diagnosis not present

## 2015-10-27 DIAGNOSIS — D509 Iron deficiency anemia, unspecified: Secondary | ICD-10-CM | POA: Diagnosis not present

## 2015-10-28 DIAGNOSIS — D631 Anemia in chronic kidney disease: Secondary | ICD-10-CM | POA: Diagnosis not present

## 2015-10-28 DIAGNOSIS — N186 End stage renal disease: Secondary | ICD-10-CM | POA: Diagnosis not present

## 2015-10-28 DIAGNOSIS — D630 Anemia in neoplastic disease: Secondary | ICD-10-CM | POA: Diagnosis not present

## 2015-10-28 DIAGNOSIS — Z4931 Encounter for adequacy testing for hemodialysis: Secondary | ICD-10-CM | POA: Diagnosis not present

## 2015-10-28 DIAGNOSIS — N2581 Secondary hyperparathyroidism of renal origin: Secondary | ICD-10-CM | POA: Diagnosis not present

## 2015-10-28 DIAGNOSIS — D509 Iron deficiency anemia, unspecified: Secondary | ICD-10-CM | POA: Diagnosis not present

## 2015-10-29 DIAGNOSIS — N186 End stage renal disease: Secondary | ICD-10-CM | POA: Diagnosis not present

## 2015-10-29 DIAGNOSIS — N2581 Secondary hyperparathyroidism of renal origin: Secondary | ICD-10-CM | POA: Diagnosis not present

## 2015-10-29 DIAGNOSIS — Z4931 Encounter for adequacy testing for hemodialysis: Secondary | ICD-10-CM | POA: Diagnosis not present

## 2015-10-29 DIAGNOSIS — D630 Anemia in neoplastic disease: Secondary | ICD-10-CM | POA: Diagnosis not present

## 2015-10-29 DIAGNOSIS — D631 Anemia in chronic kidney disease: Secondary | ICD-10-CM | POA: Diagnosis not present

## 2015-10-29 DIAGNOSIS — D509 Iron deficiency anemia, unspecified: Secondary | ICD-10-CM | POA: Diagnosis not present

## 2015-10-30 DIAGNOSIS — D630 Anemia in neoplastic disease: Secondary | ICD-10-CM | POA: Diagnosis not present

## 2015-10-30 DIAGNOSIS — D509 Iron deficiency anemia, unspecified: Secondary | ICD-10-CM | POA: Diagnosis not present

## 2015-10-30 DIAGNOSIS — N186 End stage renal disease: Secondary | ICD-10-CM | POA: Diagnosis not present

## 2015-10-30 DIAGNOSIS — D631 Anemia in chronic kidney disease: Secondary | ICD-10-CM | POA: Diagnosis not present

## 2015-10-30 DIAGNOSIS — Z4931 Encounter for adequacy testing for hemodialysis: Secondary | ICD-10-CM | POA: Diagnosis not present

## 2015-10-30 DIAGNOSIS — N2581 Secondary hyperparathyroidism of renal origin: Secondary | ICD-10-CM | POA: Diagnosis not present

## 2015-11-01 DIAGNOSIS — N2581 Secondary hyperparathyroidism of renal origin: Secondary | ICD-10-CM | POA: Diagnosis not present

## 2015-11-01 DIAGNOSIS — D509 Iron deficiency anemia, unspecified: Secondary | ICD-10-CM | POA: Diagnosis not present

## 2015-11-01 DIAGNOSIS — Z4931 Encounter for adequacy testing for hemodialysis: Secondary | ICD-10-CM | POA: Diagnosis not present

## 2015-11-01 DIAGNOSIS — N186 End stage renal disease: Secondary | ICD-10-CM | POA: Diagnosis not present

## 2015-11-01 DIAGNOSIS — D630 Anemia in neoplastic disease: Secondary | ICD-10-CM | POA: Diagnosis not present

## 2015-11-01 DIAGNOSIS — D631 Anemia in chronic kidney disease: Secondary | ICD-10-CM | POA: Diagnosis not present

## 2015-11-02 DIAGNOSIS — N186 End stage renal disease: Secondary | ICD-10-CM | POA: Diagnosis not present

## 2015-11-02 DIAGNOSIS — N08 Glomerular disorders in diseases classified elsewhere: Secondary | ICD-10-CM | POA: Diagnosis not present

## 2015-11-02 DIAGNOSIS — Z992 Dependence on renal dialysis: Secondary | ICD-10-CM | POA: Diagnosis not present

## 2015-11-03 DIAGNOSIS — E8770 Fluid overload, unspecified: Secondary | ICD-10-CM | POA: Diagnosis not present

## 2015-11-03 DIAGNOSIS — N2589 Other disorders resulting from impaired renal tubular function: Secondary | ICD-10-CM | POA: Diagnosis not present

## 2015-11-03 DIAGNOSIS — N186 End stage renal disease: Secondary | ICD-10-CM | POA: Diagnosis not present

## 2015-11-03 DIAGNOSIS — N2581 Secondary hyperparathyroidism of renal origin: Secondary | ICD-10-CM | POA: Diagnosis not present

## 2015-11-03 DIAGNOSIS — E440 Moderate protein-calorie malnutrition: Secondary | ICD-10-CM | POA: Diagnosis not present

## 2015-11-03 DIAGNOSIS — D509 Iron deficiency anemia, unspecified: Secondary | ICD-10-CM | POA: Diagnosis not present

## 2015-11-03 DIAGNOSIS — D630 Anemia in neoplastic disease: Secondary | ICD-10-CM | POA: Diagnosis not present

## 2015-11-03 DIAGNOSIS — Z79899 Other long term (current) drug therapy: Secondary | ICD-10-CM | POA: Diagnosis not present

## 2015-11-03 DIAGNOSIS — Z4931 Encounter for adequacy testing for hemodialysis: Secondary | ICD-10-CM | POA: Diagnosis not present

## 2015-11-04 DIAGNOSIS — D630 Anemia in neoplastic disease: Secondary | ICD-10-CM | POA: Diagnosis not present

## 2015-11-04 DIAGNOSIS — D509 Iron deficiency anemia, unspecified: Secondary | ICD-10-CM | POA: Diagnosis not present

## 2015-11-04 DIAGNOSIS — Z4931 Encounter for adequacy testing for hemodialysis: Secondary | ICD-10-CM | POA: Diagnosis not present

## 2015-11-04 DIAGNOSIS — E874 Mixed disorder of acid-base balance: Secondary | ICD-10-CM | POA: Diagnosis not present

## 2015-11-04 DIAGNOSIS — N186 End stage renal disease: Secondary | ICD-10-CM | POA: Diagnosis not present

## 2015-11-04 DIAGNOSIS — N2581 Secondary hyperparathyroidism of renal origin: Secondary | ICD-10-CM | POA: Diagnosis not present

## 2015-11-04 DIAGNOSIS — E440 Moderate protein-calorie malnutrition: Secondary | ICD-10-CM | POA: Diagnosis not present

## 2015-11-05 DIAGNOSIS — M25551 Pain in right hip: Secondary | ICD-10-CM | POA: Diagnosis not present

## 2015-11-05 DIAGNOSIS — M87051 Idiopathic aseptic necrosis of right femur: Secondary | ICD-10-CM | POA: Diagnosis not present

## 2015-11-06 DIAGNOSIS — E440 Moderate protein-calorie malnutrition: Secondary | ICD-10-CM | POA: Diagnosis not present

## 2015-11-06 DIAGNOSIS — D509 Iron deficiency anemia, unspecified: Secondary | ICD-10-CM | POA: Diagnosis not present

## 2015-11-06 DIAGNOSIS — N2581 Secondary hyperparathyroidism of renal origin: Secondary | ICD-10-CM | POA: Diagnosis not present

## 2015-11-06 DIAGNOSIS — N186 End stage renal disease: Secondary | ICD-10-CM | POA: Diagnosis not present

## 2015-11-06 DIAGNOSIS — D630 Anemia in neoplastic disease: Secondary | ICD-10-CM | POA: Diagnosis not present

## 2015-11-06 DIAGNOSIS — Z4931 Encounter for adequacy testing for hemodialysis: Secondary | ICD-10-CM | POA: Diagnosis not present

## 2015-11-09 DIAGNOSIS — Z4931 Encounter for adequacy testing for hemodialysis: Secondary | ICD-10-CM | POA: Diagnosis not present

## 2015-11-09 DIAGNOSIS — D630 Anemia in neoplastic disease: Secondary | ICD-10-CM | POA: Diagnosis not present

## 2015-11-09 DIAGNOSIS — E440 Moderate protein-calorie malnutrition: Secondary | ICD-10-CM | POA: Diagnosis not present

## 2015-11-09 DIAGNOSIS — N2581 Secondary hyperparathyroidism of renal origin: Secondary | ICD-10-CM | POA: Diagnosis not present

## 2015-11-09 DIAGNOSIS — D509 Iron deficiency anemia, unspecified: Secondary | ICD-10-CM | POA: Diagnosis not present

## 2015-11-09 DIAGNOSIS — N186 End stage renal disease: Secondary | ICD-10-CM | POA: Diagnosis not present

## 2015-11-11 DIAGNOSIS — D509 Iron deficiency anemia, unspecified: Secondary | ICD-10-CM | POA: Diagnosis not present

## 2015-11-11 DIAGNOSIS — N186 End stage renal disease: Secondary | ICD-10-CM | POA: Diagnosis not present

## 2015-11-11 DIAGNOSIS — E440 Moderate protein-calorie malnutrition: Secondary | ICD-10-CM | POA: Diagnosis not present

## 2015-11-11 DIAGNOSIS — Z4931 Encounter for adequacy testing for hemodialysis: Secondary | ICD-10-CM | POA: Diagnosis not present

## 2015-11-11 DIAGNOSIS — N2581 Secondary hyperparathyroidism of renal origin: Secondary | ICD-10-CM | POA: Diagnosis not present

## 2015-11-11 DIAGNOSIS — D630 Anemia in neoplastic disease: Secondary | ICD-10-CM | POA: Diagnosis not present

## 2015-11-13 DIAGNOSIS — D509 Iron deficiency anemia, unspecified: Secondary | ICD-10-CM | POA: Diagnosis not present

## 2015-11-13 DIAGNOSIS — Z4931 Encounter for adequacy testing for hemodialysis: Secondary | ICD-10-CM | POA: Diagnosis not present

## 2015-11-13 DIAGNOSIS — N186 End stage renal disease: Secondary | ICD-10-CM | POA: Diagnosis not present

## 2015-11-13 DIAGNOSIS — E440 Moderate protein-calorie malnutrition: Secondary | ICD-10-CM | POA: Diagnosis not present

## 2015-11-13 DIAGNOSIS — N2581 Secondary hyperparathyroidism of renal origin: Secondary | ICD-10-CM | POA: Diagnosis not present

## 2015-11-13 DIAGNOSIS — D630 Anemia in neoplastic disease: Secondary | ICD-10-CM | POA: Diagnosis not present

## 2015-11-14 DIAGNOSIS — D630 Anemia in neoplastic disease: Secondary | ICD-10-CM | POA: Diagnosis not present

## 2015-11-14 DIAGNOSIS — E440 Moderate protein-calorie malnutrition: Secondary | ICD-10-CM | POA: Diagnosis not present

## 2015-11-14 DIAGNOSIS — N186 End stage renal disease: Secondary | ICD-10-CM | POA: Diagnosis not present

## 2015-11-14 DIAGNOSIS — D509 Iron deficiency anemia, unspecified: Secondary | ICD-10-CM | POA: Diagnosis not present

## 2015-11-14 DIAGNOSIS — N2581 Secondary hyperparathyroidism of renal origin: Secondary | ICD-10-CM | POA: Diagnosis not present

## 2015-11-14 DIAGNOSIS — Z4931 Encounter for adequacy testing for hemodialysis: Secondary | ICD-10-CM | POA: Diagnosis not present

## 2015-11-16 DIAGNOSIS — N186 End stage renal disease: Secondary | ICD-10-CM | POA: Diagnosis not present

## 2015-11-16 DIAGNOSIS — D630 Anemia in neoplastic disease: Secondary | ICD-10-CM | POA: Diagnosis not present

## 2015-11-16 DIAGNOSIS — N2581 Secondary hyperparathyroidism of renal origin: Secondary | ICD-10-CM | POA: Diagnosis not present

## 2015-11-16 DIAGNOSIS — E440 Moderate protein-calorie malnutrition: Secondary | ICD-10-CM | POA: Diagnosis not present

## 2015-11-16 DIAGNOSIS — D509 Iron deficiency anemia, unspecified: Secondary | ICD-10-CM | POA: Diagnosis not present

## 2015-11-16 DIAGNOSIS — Z4931 Encounter for adequacy testing for hemodialysis: Secondary | ICD-10-CM | POA: Diagnosis not present

## 2015-11-18 DIAGNOSIS — N2581 Secondary hyperparathyroidism of renal origin: Secondary | ICD-10-CM | POA: Diagnosis not present

## 2015-11-18 DIAGNOSIS — E440 Moderate protein-calorie malnutrition: Secondary | ICD-10-CM | POA: Diagnosis not present

## 2015-11-18 DIAGNOSIS — D509 Iron deficiency anemia, unspecified: Secondary | ICD-10-CM | POA: Diagnosis not present

## 2015-11-18 DIAGNOSIS — Z4931 Encounter for adequacy testing for hemodialysis: Secondary | ICD-10-CM | POA: Diagnosis not present

## 2015-11-18 DIAGNOSIS — D630 Anemia in neoplastic disease: Secondary | ICD-10-CM | POA: Diagnosis not present

## 2015-11-18 DIAGNOSIS — N186 End stage renal disease: Secondary | ICD-10-CM | POA: Diagnosis not present

## 2015-11-20 DIAGNOSIS — E440 Moderate protein-calorie malnutrition: Secondary | ICD-10-CM | POA: Diagnosis not present

## 2015-11-20 DIAGNOSIS — Z4931 Encounter for adequacy testing for hemodialysis: Secondary | ICD-10-CM | POA: Diagnosis not present

## 2015-11-20 DIAGNOSIS — N186 End stage renal disease: Secondary | ICD-10-CM | POA: Diagnosis not present

## 2015-11-20 DIAGNOSIS — D630 Anemia in neoplastic disease: Secondary | ICD-10-CM | POA: Diagnosis not present

## 2015-11-20 DIAGNOSIS — D509 Iron deficiency anemia, unspecified: Secondary | ICD-10-CM | POA: Diagnosis not present

## 2015-11-20 DIAGNOSIS — N2581 Secondary hyperparathyroidism of renal origin: Secondary | ICD-10-CM | POA: Diagnosis not present

## 2015-11-22 DIAGNOSIS — E440 Moderate protein-calorie malnutrition: Secondary | ICD-10-CM | POA: Diagnosis not present

## 2015-11-22 DIAGNOSIS — Z4931 Encounter for adequacy testing for hemodialysis: Secondary | ICD-10-CM | POA: Diagnosis not present

## 2015-11-22 DIAGNOSIS — N186 End stage renal disease: Secondary | ICD-10-CM | POA: Diagnosis not present

## 2015-11-22 DIAGNOSIS — N2581 Secondary hyperparathyroidism of renal origin: Secondary | ICD-10-CM | POA: Diagnosis not present

## 2015-11-22 DIAGNOSIS — D509 Iron deficiency anemia, unspecified: Secondary | ICD-10-CM | POA: Diagnosis not present

## 2015-11-22 DIAGNOSIS — D630 Anemia in neoplastic disease: Secondary | ICD-10-CM | POA: Diagnosis not present

## 2015-11-25 DIAGNOSIS — N2581 Secondary hyperparathyroidism of renal origin: Secondary | ICD-10-CM | POA: Diagnosis not present

## 2015-11-25 DIAGNOSIS — Z4931 Encounter for adequacy testing for hemodialysis: Secondary | ICD-10-CM | POA: Diagnosis not present

## 2015-11-25 DIAGNOSIS — D509 Iron deficiency anemia, unspecified: Secondary | ICD-10-CM | POA: Diagnosis not present

## 2015-11-25 DIAGNOSIS — D630 Anemia in neoplastic disease: Secondary | ICD-10-CM | POA: Diagnosis not present

## 2015-11-25 DIAGNOSIS — N186 End stage renal disease: Secondary | ICD-10-CM | POA: Diagnosis not present

## 2015-11-25 DIAGNOSIS — E440 Moderate protein-calorie malnutrition: Secondary | ICD-10-CM | POA: Diagnosis not present

## 2015-11-27 DIAGNOSIS — D630 Anemia in neoplastic disease: Secondary | ICD-10-CM | POA: Diagnosis not present

## 2015-11-27 DIAGNOSIS — N186 End stage renal disease: Secondary | ICD-10-CM | POA: Diagnosis not present

## 2015-11-27 DIAGNOSIS — Z4931 Encounter for adequacy testing for hemodialysis: Secondary | ICD-10-CM | POA: Diagnosis not present

## 2015-11-27 DIAGNOSIS — E440 Moderate protein-calorie malnutrition: Secondary | ICD-10-CM | POA: Diagnosis not present

## 2015-11-27 DIAGNOSIS — D509 Iron deficiency anemia, unspecified: Secondary | ICD-10-CM | POA: Diagnosis not present

## 2015-11-27 DIAGNOSIS — N2581 Secondary hyperparathyroidism of renal origin: Secondary | ICD-10-CM | POA: Diagnosis not present

## 2015-11-30 DIAGNOSIS — Z4931 Encounter for adequacy testing for hemodialysis: Secondary | ICD-10-CM | POA: Diagnosis not present

## 2015-11-30 DIAGNOSIS — D630 Anemia in neoplastic disease: Secondary | ICD-10-CM | POA: Diagnosis not present

## 2015-11-30 DIAGNOSIS — D509 Iron deficiency anemia, unspecified: Secondary | ICD-10-CM | POA: Diagnosis not present

## 2015-11-30 DIAGNOSIS — E440 Moderate protein-calorie malnutrition: Secondary | ICD-10-CM | POA: Diagnosis not present

## 2015-11-30 DIAGNOSIS — N2581 Secondary hyperparathyroidism of renal origin: Secondary | ICD-10-CM | POA: Diagnosis not present

## 2015-11-30 DIAGNOSIS — N186 End stage renal disease: Secondary | ICD-10-CM | POA: Diagnosis not present

## 2015-12-02 DIAGNOSIS — D630 Anemia in neoplastic disease: Secondary | ICD-10-CM | POA: Diagnosis not present

## 2015-12-02 DIAGNOSIS — Z4931 Encounter for adequacy testing for hemodialysis: Secondary | ICD-10-CM | POA: Diagnosis not present

## 2015-12-02 DIAGNOSIS — D509 Iron deficiency anemia, unspecified: Secondary | ICD-10-CM | POA: Diagnosis not present

## 2015-12-02 DIAGNOSIS — N186 End stage renal disease: Secondary | ICD-10-CM | POA: Diagnosis not present

## 2015-12-02 DIAGNOSIS — E440 Moderate protein-calorie malnutrition: Secondary | ICD-10-CM | POA: Diagnosis not present

## 2015-12-02 DIAGNOSIS — N2581 Secondary hyperparathyroidism of renal origin: Secondary | ICD-10-CM | POA: Diagnosis not present

## 2015-12-03 DIAGNOSIS — Z992 Dependence on renal dialysis: Secondary | ICD-10-CM | POA: Diagnosis not present

## 2015-12-03 DIAGNOSIS — N186 End stage renal disease: Secondary | ICD-10-CM | POA: Diagnosis not present

## 2015-12-03 DIAGNOSIS — N08 Glomerular disorders in diseases classified elsewhere: Secondary | ICD-10-CM | POA: Diagnosis not present

## 2015-12-04 DIAGNOSIS — N186 End stage renal disease: Secondary | ICD-10-CM | POA: Diagnosis not present

## 2015-12-04 DIAGNOSIS — E440 Moderate protein-calorie malnutrition: Secondary | ICD-10-CM | POA: Diagnosis not present

## 2015-12-04 DIAGNOSIS — N2589 Other disorders resulting from impaired renal tubular function: Secondary | ICD-10-CM | POA: Diagnosis not present

## 2015-12-04 DIAGNOSIS — N2581 Secondary hyperparathyroidism of renal origin: Secondary | ICD-10-CM | POA: Diagnosis not present

## 2015-12-04 DIAGNOSIS — D630 Anemia in neoplastic disease: Secondary | ICD-10-CM | POA: Diagnosis not present

## 2015-12-04 DIAGNOSIS — E8770 Fluid overload, unspecified: Secondary | ICD-10-CM | POA: Diagnosis not present

## 2015-12-04 DIAGNOSIS — D509 Iron deficiency anemia, unspecified: Secondary | ICD-10-CM | POA: Diagnosis not present

## 2015-12-04 DIAGNOSIS — Z4931 Encounter for adequacy testing for hemodialysis: Secondary | ICD-10-CM | POA: Diagnosis not present

## 2015-12-06 DIAGNOSIS — Z4931 Encounter for adequacy testing for hemodialysis: Secondary | ICD-10-CM | POA: Diagnosis not present

## 2015-12-06 DIAGNOSIS — E440 Moderate protein-calorie malnutrition: Secondary | ICD-10-CM | POA: Diagnosis not present

## 2015-12-06 DIAGNOSIS — D509 Iron deficiency anemia, unspecified: Secondary | ICD-10-CM | POA: Diagnosis not present

## 2015-12-06 DIAGNOSIS — D630 Anemia in neoplastic disease: Secondary | ICD-10-CM | POA: Diagnosis not present

## 2015-12-06 DIAGNOSIS — N186 End stage renal disease: Secondary | ICD-10-CM | POA: Diagnosis not present

## 2015-12-06 DIAGNOSIS — N2581 Secondary hyperparathyroidism of renal origin: Secondary | ICD-10-CM | POA: Diagnosis not present

## 2015-12-09 DIAGNOSIS — N200 Calculus of kidney: Secondary | ICD-10-CM | POA: Diagnosis not present

## 2015-12-09 DIAGNOSIS — N5203 Combined arterial insufficiency and corporo-venous occlusive erectile dysfunction: Secondary | ICD-10-CM | POA: Diagnosis not present

## 2015-12-09 DIAGNOSIS — N189 Chronic kidney disease, unspecified: Secondary | ICD-10-CM | POA: Diagnosis not present

## 2015-12-09 DIAGNOSIS — N3000 Acute cystitis without hematuria: Secondary | ICD-10-CM | POA: Diagnosis not present

## 2015-12-10 DIAGNOSIS — D630 Anemia in neoplastic disease: Secondary | ICD-10-CM | POA: Diagnosis not present

## 2015-12-10 DIAGNOSIS — N2581 Secondary hyperparathyroidism of renal origin: Secondary | ICD-10-CM | POA: Diagnosis not present

## 2015-12-10 DIAGNOSIS — E440 Moderate protein-calorie malnutrition: Secondary | ICD-10-CM | POA: Diagnosis not present

## 2015-12-10 DIAGNOSIS — N186 End stage renal disease: Secondary | ICD-10-CM | POA: Diagnosis not present

## 2015-12-10 DIAGNOSIS — D509 Iron deficiency anemia, unspecified: Secondary | ICD-10-CM | POA: Diagnosis not present

## 2015-12-10 DIAGNOSIS — Z4931 Encounter for adequacy testing for hemodialysis: Secondary | ICD-10-CM | POA: Diagnosis not present

## 2015-12-11 DIAGNOSIS — R1031 Right lower quadrant pain: Secondary | ICD-10-CM | POA: Diagnosis not present

## 2015-12-11 DIAGNOSIS — Q613 Polycystic kidney, unspecified: Secondary | ICD-10-CM | POA: Diagnosis not present

## 2015-12-11 DIAGNOSIS — N200 Calculus of kidney: Secondary | ICD-10-CM | POA: Diagnosis not present

## 2015-12-11 DIAGNOSIS — N400 Enlarged prostate without lower urinary tract symptoms: Secondary | ICD-10-CM | POA: Diagnosis not present

## 2015-12-11 DIAGNOSIS — M879 Osteonecrosis, unspecified: Secondary | ICD-10-CM | POA: Diagnosis not present

## 2015-12-12 DIAGNOSIS — Z4931 Encounter for adequacy testing for hemodialysis: Secondary | ICD-10-CM | POA: Diagnosis not present

## 2015-12-12 DIAGNOSIS — N318 Other neuromuscular dysfunction of bladder: Secondary | ICD-10-CM | POA: Diagnosis not present

## 2015-12-12 DIAGNOSIS — R311 Benign essential microscopic hematuria: Secondary | ICD-10-CM | POA: Diagnosis not present

## 2015-12-12 DIAGNOSIS — E440 Moderate protein-calorie malnutrition: Secondary | ICD-10-CM | POA: Diagnosis not present

## 2015-12-12 DIAGNOSIS — N5203 Combined arterial insufficiency and corporo-venous occlusive erectile dysfunction: Secondary | ICD-10-CM | POA: Diagnosis not present

## 2015-12-12 DIAGNOSIS — N2581 Secondary hyperparathyroidism of renal origin: Secondary | ICD-10-CM | POA: Diagnosis not present

## 2015-12-12 DIAGNOSIS — D630 Anemia in neoplastic disease: Secondary | ICD-10-CM | POA: Diagnosis not present

## 2015-12-12 DIAGNOSIS — N186 End stage renal disease: Secondary | ICD-10-CM | POA: Diagnosis not present

## 2015-12-12 DIAGNOSIS — N3021 Other chronic cystitis with hematuria: Secondary | ICD-10-CM | POA: Diagnosis not present

## 2015-12-12 DIAGNOSIS — D509 Iron deficiency anemia, unspecified: Secondary | ICD-10-CM | POA: Diagnosis not present

## 2015-12-12 DIAGNOSIS — N200 Calculus of kidney: Secondary | ICD-10-CM | POA: Diagnosis not present

## 2015-12-14 DIAGNOSIS — D630 Anemia in neoplastic disease: Secondary | ICD-10-CM | POA: Diagnosis not present

## 2015-12-14 DIAGNOSIS — Z4931 Encounter for adequacy testing for hemodialysis: Secondary | ICD-10-CM | POA: Diagnosis not present

## 2015-12-14 DIAGNOSIS — N186 End stage renal disease: Secondary | ICD-10-CM | POA: Diagnosis not present

## 2015-12-14 DIAGNOSIS — E440 Moderate protein-calorie malnutrition: Secondary | ICD-10-CM | POA: Diagnosis not present

## 2015-12-14 DIAGNOSIS — N2581 Secondary hyperparathyroidism of renal origin: Secondary | ICD-10-CM | POA: Diagnosis not present

## 2015-12-14 DIAGNOSIS — D509 Iron deficiency anemia, unspecified: Secondary | ICD-10-CM | POA: Diagnosis not present

## 2015-12-16 DIAGNOSIS — N2581 Secondary hyperparathyroidism of renal origin: Secondary | ICD-10-CM | POA: Diagnosis not present

## 2015-12-16 DIAGNOSIS — D630 Anemia in neoplastic disease: Secondary | ICD-10-CM | POA: Diagnosis not present

## 2015-12-16 DIAGNOSIS — N186 End stage renal disease: Secondary | ICD-10-CM | POA: Diagnosis not present

## 2015-12-16 DIAGNOSIS — E440 Moderate protein-calorie malnutrition: Secondary | ICD-10-CM | POA: Diagnosis not present

## 2015-12-16 DIAGNOSIS — D509 Iron deficiency anemia, unspecified: Secondary | ICD-10-CM | POA: Diagnosis not present

## 2015-12-16 DIAGNOSIS — Z4931 Encounter for adequacy testing for hemodialysis: Secondary | ICD-10-CM | POA: Diagnosis not present

## 2015-12-18 DIAGNOSIS — D630 Anemia in neoplastic disease: Secondary | ICD-10-CM | POA: Diagnosis not present

## 2015-12-18 DIAGNOSIS — N186 End stage renal disease: Secondary | ICD-10-CM | POA: Diagnosis not present

## 2015-12-18 DIAGNOSIS — E440 Moderate protein-calorie malnutrition: Secondary | ICD-10-CM | POA: Diagnosis not present

## 2015-12-18 DIAGNOSIS — N2581 Secondary hyperparathyroidism of renal origin: Secondary | ICD-10-CM | POA: Diagnosis not present

## 2015-12-18 DIAGNOSIS — D509 Iron deficiency anemia, unspecified: Secondary | ICD-10-CM | POA: Diagnosis not present

## 2015-12-18 DIAGNOSIS — Z4931 Encounter for adequacy testing for hemodialysis: Secondary | ICD-10-CM | POA: Diagnosis not present

## 2015-12-20 DIAGNOSIS — E440 Moderate protein-calorie malnutrition: Secondary | ICD-10-CM | POA: Diagnosis not present

## 2015-12-20 DIAGNOSIS — N186 End stage renal disease: Secondary | ICD-10-CM | POA: Diagnosis not present

## 2015-12-20 DIAGNOSIS — D630 Anemia in neoplastic disease: Secondary | ICD-10-CM | POA: Diagnosis not present

## 2015-12-20 DIAGNOSIS — Z4931 Encounter for adequacy testing for hemodialysis: Secondary | ICD-10-CM | POA: Diagnosis not present

## 2015-12-20 DIAGNOSIS — D509 Iron deficiency anemia, unspecified: Secondary | ICD-10-CM | POA: Diagnosis not present

## 2015-12-20 DIAGNOSIS — N2581 Secondary hyperparathyroidism of renal origin: Secondary | ICD-10-CM | POA: Diagnosis not present

## 2015-12-22 DIAGNOSIS — E440 Moderate protein-calorie malnutrition: Secondary | ICD-10-CM | POA: Diagnosis not present

## 2015-12-22 DIAGNOSIS — D509 Iron deficiency anemia, unspecified: Secondary | ICD-10-CM | POA: Diagnosis not present

## 2015-12-22 DIAGNOSIS — N2581 Secondary hyperparathyroidism of renal origin: Secondary | ICD-10-CM | POA: Diagnosis not present

## 2015-12-22 DIAGNOSIS — Z4931 Encounter for adequacy testing for hemodialysis: Secondary | ICD-10-CM | POA: Diagnosis not present

## 2015-12-22 DIAGNOSIS — D630 Anemia in neoplastic disease: Secondary | ICD-10-CM | POA: Diagnosis not present

## 2015-12-22 DIAGNOSIS — N186 End stage renal disease: Secondary | ICD-10-CM | POA: Diagnosis not present

## 2015-12-24 DIAGNOSIS — E440 Moderate protein-calorie malnutrition: Secondary | ICD-10-CM | POA: Diagnosis not present

## 2015-12-24 DIAGNOSIS — D509 Iron deficiency anemia, unspecified: Secondary | ICD-10-CM | POA: Diagnosis not present

## 2015-12-24 DIAGNOSIS — D630 Anemia in neoplastic disease: Secondary | ICD-10-CM | POA: Diagnosis not present

## 2015-12-24 DIAGNOSIS — N186 End stage renal disease: Secondary | ICD-10-CM | POA: Diagnosis not present

## 2015-12-24 DIAGNOSIS — Z4931 Encounter for adequacy testing for hemodialysis: Secondary | ICD-10-CM | POA: Diagnosis not present

## 2015-12-24 DIAGNOSIS — N2581 Secondary hyperparathyroidism of renal origin: Secondary | ICD-10-CM | POA: Diagnosis not present

## 2015-12-25 DIAGNOSIS — Z79891 Long term (current) use of opiate analgesic: Secondary | ICD-10-CM | POA: Diagnosis not present

## 2015-12-25 DIAGNOSIS — G894 Chronic pain syndrome: Secondary | ICD-10-CM | POA: Diagnosis not present

## 2015-12-25 DIAGNOSIS — M87021 Idiopathic aseptic necrosis of right humerus: Secondary | ICD-10-CM | POA: Diagnosis not present

## 2015-12-25 DIAGNOSIS — M87022 Idiopathic aseptic necrosis of left humerus: Secondary | ICD-10-CM | POA: Diagnosis not present

## 2015-12-26 DIAGNOSIS — D630 Anemia in neoplastic disease: Secondary | ICD-10-CM | POA: Diagnosis not present

## 2015-12-26 DIAGNOSIS — E440 Moderate protein-calorie malnutrition: Secondary | ICD-10-CM | POA: Diagnosis not present

## 2015-12-26 DIAGNOSIS — N2581 Secondary hyperparathyroidism of renal origin: Secondary | ICD-10-CM | POA: Diagnosis not present

## 2015-12-26 DIAGNOSIS — D509 Iron deficiency anemia, unspecified: Secondary | ICD-10-CM | POA: Diagnosis not present

## 2015-12-26 DIAGNOSIS — N186 End stage renal disease: Secondary | ICD-10-CM | POA: Diagnosis not present

## 2015-12-26 DIAGNOSIS — Z4931 Encounter for adequacy testing for hemodialysis: Secondary | ICD-10-CM | POA: Diagnosis not present

## 2015-12-29 DIAGNOSIS — D509 Iron deficiency anemia, unspecified: Secondary | ICD-10-CM | POA: Diagnosis not present

## 2015-12-29 DIAGNOSIS — D630 Anemia in neoplastic disease: Secondary | ICD-10-CM | POA: Diagnosis not present

## 2015-12-29 DIAGNOSIS — Z4931 Encounter for adequacy testing for hemodialysis: Secondary | ICD-10-CM | POA: Diagnosis not present

## 2015-12-29 DIAGNOSIS — N2581 Secondary hyperparathyroidism of renal origin: Secondary | ICD-10-CM | POA: Diagnosis not present

## 2015-12-29 DIAGNOSIS — E440 Moderate protein-calorie malnutrition: Secondary | ICD-10-CM | POA: Diagnosis not present

## 2015-12-29 DIAGNOSIS — N186 End stage renal disease: Secondary | ICD-10-CM | POA: Diagnosis not present

## 2015-12-31 DIAGNOSIS — Z4931 Encounter for adequacy testing for hemodialysis: Secondary | ICD-10-CM | POA: Diagnosis not present

## 2015-12-31 DIAGNOSIS — N186 End stage renal disease: Secondary | ICD-10-CM | POA: Diagnosis not present

## 2015-12-31 DIAGNOSIS — N2581 Secondary hyperparathyroidism of renal origin: Secondary | ICD-10-CM | POA: Diagnosis not present

## 2015-12-31 DIAGNOSIS — E440 Moderate protein-calorie malnutrition: Secondary | ICD-10-CM | POA: Diagnosis not present

## 2015-12-31 DIAGNOSIS — D630 Anemia in neoplastic disease: Secondary | ICD-10-CM | POA: Diagnosis not present

## 2015-12-31 DIAGNOSIS — D509 Iron deficiency anemia, unspecified: Secondary | ICD-10-CM | POA: Diagnosis not present

## 2016-01-02 DIAGNOSIS — N186 End stage renal disease: Secondary | ICD-10-CM | POA: Diagnosis not present

## 2016-01-02 DIAGNOSIS — N08 Glomerular disorders in diseases classified elsewhere: Secondary | ICD-10-CM | POA: Diagnosis not present

## 2016-01-02 DIAGNOSIS — Z992 Dependence on renal dialysis: Secondary | ICD-10-CM | POA: Diagnosis not present

## 2016-01-03 DIAGNOSIS — Z79899 Other long term (current) drug therapy: Secondary | ICD-10-CM | POA: Diagnosis not present

## 2016-01-03 DIAGNOSIS — E440 Moderate protein-calorie malnutrition: Secondary | ICD-10-CM | POA: Diagnosis not present

## 2016-01-03 DIAGNOSIS — N2581 Secondary hyperparathyroidism of renal origin: Secondary | ICD-10-CM | POA: Diagnosis not present

## 2016-01-03 DIAGNOSIS — E8770 Fluid overload, unspecified: Secondary | ICD-10-CM | POA: Diagnosis not present

## 2016-01-03 DIAGNOSIS — D631 Anemia in chronic kidney disease: Secondary | ICD-10-CM | POA: Diagnosis not present

## 2016-01-03 DIAGNOSIS — N186 End stage renal disease: Secondary | ICD-10-CM | POA: Diagnosis not present

## 2016-01-03 DIAGNOSIS — D630 Anemia in neoplastic disease: Secondary | ICD-10-CM | POA: Diagnosis not present

## 2016-01-03 DIAGNOSIS — N2589 Other disorders resulting from impaired renal tubular function: Secondary | ICD-10-CM | POA: Diagnosis not present

## 2016-01-03 DIAGNOSIS — D509 Iron deficiency anemia, unspecified: Secondary | ICD-10-CM | POA: Diagnosis not present

## 2016-01-05 DIAGNOSIS — N186 End stage renal disease: Secondary | ICD-10-CM | POA: Diagnosis not present

## 2016-01-05 DIAGNOSIS — Z79899 Other long term (current) drug therapy: Secondary | ICD-10-CM | POA: Diagnosis not present

## 2016-01-05 DIAGNOSIS — D509 Iron deficiency anemia, unspecified: Secondary | ICD-10-CM | POA: Diagnosis not present

## 2016-01-05 DIAGNOSIS — N2589 Other disorders resulting from impaired renal tubular function: Secondary | ICD-10-CM | POA: Diagnosis not present

## 2016-01-05 DIAGNOSIS — E440 Moderate protein-calorie malnutrition: Secondary | ICD-10-CM | POA: Diagnosis not present

## 2016-01-05 DIAGNOSIS — N2581 Secondary hyperparathyroidism of renal origin: Secondary | ICD-10-CM | POA: Diagnosis not present

## 2016-01-08 DIAGNOSIS — N186 End stage renal disease: Secondary | ICD-10-CM | POA: Diagnosis not present

## 2016-01-08 DIAGNOSIS — Z79899 Other long term (current) drug therapy: Secondary | ICD-10-CM | POA: Diagnosis not present

## 2016-01-08 DIAGNOSIS — N2581 Secondary hyperparathyroidism of renal origin: Secondary | ICD-10-CM | POA: Diagnosis not present

## 2016-01-08 DIAGNOSIS — E440 Moderate protein-calorie malnutrition: Secondary | ICD-10-CM | POA: Diagnosis not present

## 2016-01-08 DIAGNOSIS — D509 Iron deficiency anemia, unspecified: Secondary | ICD-10-CM | POA: Diagnosis not present

## 2016-01-08 DIAGNOSIS — N2589 Other disorders resulting from impaired renal tubular function: Secondary | ICD-10-CM | POA: Diagnosis not present

## 2016-01-11 DIAGNOSIS — E440 Moderate protein-calorie malnutrition: Secondary | ICD-10-CM | POA: Diagnosis not present

## 2016-01-11 DIAGNOSIS — N2581 Secondary hyperparathyroidism of renal origin: Secondary | ICD-10-CM | POA: Diagnosis not present

## 2016-01-11 DIAGNOSIS — N186 End stage renal disease: Secondary | ICD-10-CM | POA: Diagnosis not present

## 2016-01-11 DIAGNOSIS — N2589 Other disorders resulting from impaired renal tubular function: Secondary | ICD-10-CM | POA: Diagnosis not present

## 2016-01-11 DIAGNOSIS — Z79899 Other long term (current) drug therapy: Secondary | ICD-10-CM | POA: Diagnosis not present

## 2016-01-11 DIAGNOSIS — D509 Iron deficiency anemia, unspecified: Secondary | ICD-10-CM | POA: Diagnosis not present

## 2016-01-13 DIAGNOSIS — Z79899 Other long term (current) drug therapy: Secondary | ICD-10-CM | POA: Diagnosis not present

## 2016-01-13 DIAGNOSIS — N2589 Other disorders resulting from impaired renal tubular function: Secondary | ICD-10-CM | POA: Diagnosis not present

## 2016-01-13 DIAGNOSIS — D509 Iron deficiency anemia, unspecified: Secondary | ICD-10-CM | POA: Diagnosis not present

## 2016-01-13 DIAGNOSIS — E440 Moderate protein-calorie malnutrition: Secondary | ICD-10-CM | POA: Diagnosis not present

## 2016-01-13 DIAGNOSIS — N186 End stage renal disease: Secondary | ICD-10-CM | POA: Diagnosis not present

## 2016-01-13 DIAGNOSIS — N2581 Secondary hyperparathyroidism of renal origin: Secondary | ICD-10-CM | POA: Diagnosis not present

## 2016-01-16 DIAGNOSIS — N2581 Secondary hyperparathyroidism of renal origin: Secondary | ICD-10-CM | POA: Diagnosis not present

## 2016-01-16 DIAGNOSIS — N2589 Other disorders resulting from impaired renal tubular function: Secondary | ICD-10-CM | POA: Diagnosis not present

## 2016-01-16 DIAGNOSIS — Z79899 Other long term (current) drug therapy: Secondary | ICD-10-CM | POA: Diagnosis not present

## 2016-01-16 DIAGNOSIS — D509 Iron deficiency anemia, unspecified: Secondary | ICD-10-CM | POA: Diagnosis not present

## 2016-01-16 DIAGNOSIS — N186 End stage renal disease: Secondary | ICD-10-CM | POA: Diagnosis not present

## 2016-01-16 DIAGNOSIS — E440 Moderate protein-calorie malnutrition: Secondary | ICD-10-CM | POA: Diagnosis not present

## 2016-01-19 DIAGNOSIS — N2589 Other disorders resulting from impaired renal tubular function: Secondary | ICD-10-CM | POA: Diagnosis not present

## 2016-01-19 DIAGNOSIS — R7989 Other specified abnormal findings of blood chemistry: Secondary | ICD-10-CM | POA: Diagnosis not present

## 2016-01-19 DIAGNOSIS — Z79899 Other long term (current) drug therapy: Secondary | ICD-10-CM | POA: Diagnosis not present

## 2016-01-19 DIAGNOSIS — D509 Iron deficiency anemia, unspecified: Secondary | ICD-10-CM | POA: Diagnosis not present

## 2016-01-19 DIAGNOSIS — E7800 Pure hypercholesterolemia, unspecified: Secondary | ICD-10-CM | POA: Diagnosis not present

## 2016-01-19 DIAGNOSIS — E874 Mixed disorder of acid-base balance: Secondary | ICD-10-CM | POA: Diagnosis not present

## 2016-01-19 DIAGNOSIS — N2581 Secondary hyperparathyroidism of renal origin: Secondary | ICD-10-CM | POA: Diagnosis not present

## 2016-01-19 DIAGNOSIS — E440 Moderate protein-calorie malnutrition: Secondary | ICD-10-CM | POA: Diagnosis not present

## 2016-01-19 DIAGNOSIS — N186 End stage renal disease: Secondary | ICD-10-CM | POA: Diagnosis not present

## 2016-01-20 DIAGNOSIS — Z79899 Other long term (current) drug therapy: Secondary | ICD-10-CM | POA: Diagnosis not present

## 2016-01-20 DIAGNOSIS — N186 End stage renal disease: Secondary | ICD-10-CM | POA: Diagnosis not present

## 2016-01-20 DIAGNOSIS — M87051 Idiopathic aseptic necrosis of right femur: Secondary | ICD-10-CM | POA: Diagnosis not present

## 2016-01-20 DIAGNOSIS — N2581 Secondary hyperparathyroidism of renal origin: Secondary | ICD-10-CM | POA: Diagnosis not present

## 2016-01-20 DIAGNOSIS — D509 Iron deficiency anemia, unspecified: Secondary | ICD-10-CM | POA: Diagnosis not present

## 2016-01-20 DIAGNOSIS — E440 Moderate protein-calorie malnutrition: Secondary | ICD-10-CM | POA: Diagnosis not present

## 2016-01-20 DIAGNOSIS — N2589 Other disorders resulting from impaired renal tubular function: Secondary | ICD-10-CM | POA: Diagnosis not present

## 2016-01-26 DIAGNOSIS — M87051 Idiopathic aseptic necrosis of right femur: Secondary | ICD-10-CM | POA: Diagnosis not present

## 2016-01-29 DIAGNOSIS — E440 Moderate protein-calorie malnutrition: Secondary | ICD-10-CM | POA: Diagnosis not present

## 2016-01-29 DIAGNOSIS — N2589 Other disorders resulting from impaired renal tubular function: Secondary | ICD-10-CM | POA: Diagnosis not present

## 2016-01-29 DIAGNOSIS — D509 Iron deficiency anemia, unspecified: Secondary | ICD-10-CM | POA: Diagnosis not present

## 2016-01-29 DIAGNOSIS — Z79899 Other long term (current) drug therapy: Secondary | ICD-10-CM | POA: Diagnosis not present

## 2016-01-29 DIAGNOSIS — N186 End stage renal disease: Secondary | ICD-10-CM | POA: Diagnosis not present

## 2016-01-29 DIAGNOSIS — N2581 Secondary hyperparathyroidism of renal origin: Secondary | ICD-10-CM | POA: Diagnosis not present

## 2016-01-31 DIAGNOSIS — N2581 Secondary hyperparathyroidism of renal origin: Secondary | ICD-10-CM | POA: Diagnosis not present

## 2016-01-31 DIAGNOSIS — E440 Moderate protein-calorie malnutrition: Secondary | ICD-10-CM | POA: Diagnosis not present

## 2016-01-31 DIAGNOSIS — N186 End stage renal disease: Secondary | ICD-10-CM | POA: Diagnosis not present

## 2016-01-31 DIAGNOSIS — N2589 Other disorders resulting from impaired renal tubular function: Secondary | ICD-10-CM | POA: Diagnosis not present

## 2016-01-31 DIAGNOSIS — Z79899 Other long term (current) drug therapy: Secondary | ICD-10-CM | POA: Diagnosis not present

## 2016-01-31 DIAGNOSIS — D509 Iron deficiency anemia, unspecified: Secondary | ICD-10-CM | POA: Diagnosis not present

## 2016-02-02 DIAGNOSIS — N186 End stage renal disease: Secondary | ICD-10-CM | POA: Diagnosis not present

## 2016-02-02 DIAGNOSIS — N08 Glomerular disorders in diseases classified elsewhere: Secondary | ICD-10-CM | POA: Diagnosis not present

## 2016-02-02 DIAGNOSIS — Z992 Dependence on renal dialysis: Secondary | ICD-10-CM | POA: Diagnosis not present

## 2016-02-04 DIAGNOSIS — D509 Iron deficiency anemia, unspecified: Secondary | ICD-10-CM | POA: Diagnosis not present

## 2016-02-04 DIAGNOSIS — N2581 Secondary hyperparathyroidism of renal origin: Secondary | ICD-10-CM | POA: Diagnosis not present

## 2016-02-04 DIAGNOSIS — D631 Anemia in chronic kidney disease: Secondary | ICD-10-CM | POA: Diagnosis not present

## 2016-02-04 DIAGNOSIS — N2589 Other disorders resulting from impaired renal tubular function: Secondary | ICD-10-CM | POA: Diagnosis not present

## 2016-02-04 DIAGNOSIS — D630 Anemia in neoplastic disease: Secondary | ICD-10-CM | POA: Diagnosis not present

## 2016-02-04 DIAGNOSIS — E8770 Fluid overload, unspecified: Secondary | ICD-10-CM | POA: Diagnosis not present

## 2016-02-04 DIAGNOSIS — N186 End stage renal disease: Secondary | ICD-10-CM | POA: Diagnosis not present

## 2016-02-04 DIAGNOSIS — E7800 Pure hypercholesterolemia, unspecified: Secondary | ICD-10-CM | POA: Diagnosis not present

## 2016-02-04 DIAGNOSIS — R7989 Other specified abnormal findings of blood chemistry: Secondary | ICD-10-CM | POA: Diagnosis not present

## 2016-02-04 DIAGNOSIS — E440 Moderate protein-calorie malnutrition: Secondary | ICD-10-CM | POA: Diagnosis not present

## 2016-02-04 DIAGNOSIS — Z79899 Other long term (current) drug therapy: Secondary | ICD-10-CM | POA: Diagnosis not present

## 2016-02-04 DIAGNOSIS — K7689 Other specified diseases of liver: Secondary | ICD-10-CM | POA: Diagnosis not present

## 2016-02-04 DIAGNOSIS — E874 Mixed disorder of acid-base balance: Secondary | ICD-10-CM | POA: Diagnosis not present

## 2016-02-06 DIAGNOSIS — D509 Iron deficiency anemia, unspecified: Secondary | ICD-10-CM | POA: Diagnosis not present

## 2016-02-06 DIAGNOSIS — N2581 Secondary hyperparathyroidism of renal origin: Secondary | ICD-10-CM | POA: Diagnosis not present

## 2016-02-06 DIAGNOSIS — D631 Anemia in chronic kidney disease: Secondary | ICD-10-CM | POA: Diagnosis not present

## 2016-02-06 DIAGNOSIS — N186 End stage renal disease: Secondary | ICD-10-CM | POA: Diagnosis not present

## 2016-02-06 DIAGNOSIS — K7689 Other specified diseases of liver: Secondary | ICD-10-CM | POA: Diagnosis not present

## 2016-02-06 DIAGNOSIS — E440 Moderate protein-calorie malnutrition: Secondary | ICD-10-CM | POA: Diagnosis not present

## 2016-02-08 DIAGNOSIS — N186 End stage renal disease: Secondary | ICD-10-CM | POA: Diagnosis not present

## 2016-02-08 DIAGNOSIS — E440 Moderate protein-calorie malnutrition: Secondary | ICD-10-CM | POA: Diagnosis not present

## 2016-02-08 DIAGNOSIS — N2581 Secondary hyperparathyroidism of renal origin: Secondary | ICD-10-CM | POA: Diagnosis not present

## 2016-02-08 DIAGNOSIS — K7689 Other specified diseases of liver: Secondary | ICD-10-CM | POA: Diagnosis not present

## 2016-02-08 DIAGNOSIS — D631 Anemia in chronic kidney disease: Secondary | ICD-10-CM | POA: Diagnosis not present

## 2016-02-08 DIAGNOSIS — D509 Iron deficiency anemia, unspecified: Secondary | ICD-10-CM | POA: Diagnosis not present

## 2016-02-10 DIAGNOSIS — E440 Moderate protein-calorie malnutrition: Secondary | ICD-10-CM | POA: Diagnosis not present

## 2016-02-10 DIAGNOSIS — N2581 Secondary hyperparathyroidism of renal origin: Secondary | ICD-10-CM | POA: Diagnosis not present

## 2016-02-10 DIAGNOSIS — N186 End stage renal disease: Secondary | ICD-10-CM | POA: Diagnosis not present

## 2016-02-10 DIAGNOSIS — D509 Iron deficiency anemia, unspecified: Secondary | ICD-10-CM | POA: Diagnosis not present

## 2016-02-10 DIAGNOSIS — D631 Anemia in chronic kidney disease: Secondary | ICD-10-CM | POA: Diagnosis not present

## 2016-02-10 DIAGNOSIS — K7689 Other specified diseases of liver: Secondary | ICD-10-CM | POA: Diagnosis not present

## 2016-02-12 DIAGNOSIS — D509 Iron deficiency anemia, unspecified: Secondary | ICD-10-CM | POA: Diagnosis not present

## 2016-02-12 DIAGNOSIS — N186 End stage renal disease: Secondary | ICD-10-CM | POA: Diagnosis not present

## 2016-02-12 DIAGNOSIS — K7689 Other specified diseases of liver: Secondary | ICD-10-CM | POA: Diagnosis not present

## 2016-02-12 DIAGNOSIS — E440 Moderate protein-calorie malnutrition: Secondary | ICD-10-CM | POA: Diagnosis not present

## 2016-02-12 DIAGNOSIS — D631 Anemia in chronic kidney disease: Secondary | ICD-10-CM | POA: Diagnosis not present

## 2016-02-12 DIAGNOSIS — N2581 Secondary hyperparathyroidism of renal origin: Secondary | ICD-10-CM | POA: Diagnosis not present

## 2016-02-14 DIAGNOSIS — D509 Iron deficiency anemia, unspecified: Secondary | ICD-10-CM | POA: Diagnosis not present

## 2016-02-14 DIAGNOSIS — D631 Anemia in chronic kidney disease: Secondary | ICD-10-CM | POA: Diagnosis not present

## 2016-02-14 DIAGNOSIS — K7689 Other specified diseases of liver: Secondary | ICD-10-CM | POA: Diagnosis not present

## 2016-02-14 DIAGNOSIS — N186 End stage renal disease: Secondary | ICD-10-CM | POA: Diagnosis not present

## 2016-02-14 DIAGNOSIS — E440 Moderate protein-calorie malnutrition: Secondary | ICD-10-CM | POA: Diagnosis not present

## 2016-02-14 DIAGNOSIS — N2581 Secondary hyperparathyroidism of renal origin: Secondary | ICD-10-CM | POA: Diagnosis not present

## 2016-02-17 DIAGNOSIS — D509 Iron deficiency anemia, unspecified: Secondary | ICD-10-CM | POA: Diagnosis not present

## 2016-02-17 DIAGNOSIS — K7689 Other specified diseases of liver: Secondary | ICD-10-CM | POA: Diagnosis not present

## 2016-02-17 DIAGNOSIS — N186 End stage renal disease: Secondary | ICD-10-CM | POA: Diagnosis not present

## 2016-02-17 DIAGNOSIS — D631 Anemia in chronic kidney disease: Secondary | ICD-10-CM | POA: Diagnosis not present

## 2016-02-17 DIAGNOSIS — N2581 Secondary hyperparathyroidism of renal origin: Secondary | ICD-10-CM | POA: Diagnosis not present

## 2016-02-17 DIAGNOSIS — E440 Moderate protein-calorie malnutrition: Secondary | ICD-10-CM | POA: Diagnosis not present

## 2016-02-18 DIAGNOSIS — M87051 Idiopathic aseptic necrosis of right femur: Secondary | ICD-10-CM | POA: Diagnosis not present

## 2016-02-18 DIAGNOSIS — M25551 Pain in right hip: Secondary | ICD-10-CM | POA: Diagnosis not present

## 2016-02-19 DIAGNOSIS — N2581 Secondary hyperparathyroidism of renal origin: Secondary | ICD-10-CM | POA: Diagnosis not present

## 2016-02-19 DIAGNOSIS — D631 Anemia in chronic kidney disease: Secondary | ICD-10-CM | POA: Diagnosis not present

## 2016-02-19 DIAGNOSIS — E440 Moderate protein-calorie malnutrition: Secondary | ICD-10-CM | POA: Diagnosis not present

## 2016-02-19 DIAGNOSIS — D509 Iron deficiency anemia, unspecified: Secondary | ICD-10-CM | POA: Diagnosis not present

## 2016-02-19 DIAGNOSIS — K7689 Other specified diseases of liver: Secondary | ICD-10-CM | POA: Diagnosis not present

## 2016-02-19 DIAGNOSIS — N186 End stage renal disease: Secondary | ICD-10-CM | POA: Diagnosis not present

## 2016-02-22 DIAGNOSIS — D509 Iron deficiency anemia, unspecified: Secondary | ICD-10-CM | POA: Diagnosis not present

## 2016-02-22 DIAGNOSIS — N186 End stage renal disease: Secondary | ICD-10-CM | POA: Diagnosis not present

## 2016-02-22 DIAGNOSIS — K7689 Other specified diseases of liver: Secondary | ICD-10-CM | POA: Diagnosis not present

## 2016-02-22 DIAGNOSIS — N2581 Secondary hyperparathyroidism of renal origin: Secondary | ICD-10-CM | POA: Diagnosis not present

## 2016-02-22 DIAGNOSIS — D631 Anemia in chronic kidney disease: Secondary | ICD-10-CM | POA: Diagnosis not present

## 2016-02-22 DIAGNOSIS — E440 Moderate protein-calorie malnutrition: Secondary | ICD-10-CM | POA: Diagnosis not present

## 2016-02-23 DIAGNOSIS — N2581 Secondary hyperparathyroidism of renal origin: Secondary | ICD-10-CM | POA: Diagnosis not present

## 2016-02-23 DIAGNOSIS — D631 Anemia in chronic kidney disease: Secondary | ICD-10-CM | POA: Diagnosis not present

## 2016-02-23 DIAGNOSIS — K7689 Other specified diseases of liver: Secondary | ICD-10-CM | POA: Diagnosis not present

## 2016-02-23 DIAGNOSIS — E440 Moderate protein-calorie malnutrition: Secondary | ICD-10-CM | POA: Diagnosis not present

## 2016-02-23 DIAGNOSIS — N186 End stage renal disease: Secondary | ICD-10-CM | POA: Diagnosis not present

## 2016-02-23 DIAGNOSIS — D509 Iron deficiency anemia, unspecified: Secondary | ICD-10-CM | POA: Diagnosis not present

## 2016-02-24 DIAGNOSIS — Z9181 History of falling: Secondary | ICD-10-CM | POA: Diagnosis not present

## 2016-02-24 DIAGNOSIS — Z0000 Encounter for general adult medical examination without abnormal findings: Secondary | ICD-10-CM | POA: Diagnosis not present

## 2016-02-24 DIAGNOSIS — Z1389 Encounter for screening for other disorder: Secondary | ICD-10-CM | POA: Diagnosis not present

## 2016-02-24 DIAGNOSIS — N186 End stage renal disease: Secondary | ICD-10-CM | POA: Diagnosis not present

## 2016-02-24 DIAGNOSIS — Z992 Dependence on renal dialysis: Secondary | ICD-10-CM | POA: Diagnosis not present

## 2016-02-25 DIAGNOSIS — K7689 Other specified diseases of liver: Secondary | ICD-10-CM | POA: Diagnosis not present

## 2016-02-25 DIAGNOSIS — E440 Moderate protein-calorie malnutrition: Secondary | ICD-10-CM | POA: Diagnosis not present

## 2016-02-25 DIAGNOSIS — N2581 Secondary hyperparathyroidism of renal origin: Secondary | ICD-10-CM | POA: Diagnosis not present

## 2016-02-25 DIAGNOSIS — D509 Iron deficiency anemia, unspecified: Secondary | ICD-10-CM | POA: Diagnosis not present

## 2016-02-25 DIAGNOSIS — D631 Anemia in chronic kidney disease: Secondary | ICD-10-CM | POA: Diagnosis not present

## 2016-02-25 DIAGNOSIS — N186 End stage renal disease: Secondary | ICD-10-CM | POA: Diagnosis not present

## 2016-02-26 ENCOUNTER — Other Ambulatory Visit: Payer: Self-pay | Admitting: Surgical

## 2016-02-26 DIAGNOSIS — N186 End stage renal disease: Secondary | ICD-10-CM | POA: Diagnosis not present

## 2016-02-26 DIAGNOSIS — D631 Anemia in chronic kidney disease: Secondary | ICD-10-CM | POA: Diagnosis not present

## 2016-02-26 DIAGNOSIS — K7689 Other specified diseases of liver: Secondary | ICD-10-CM | POA: Diagnosis not present

## 2016-02-26 DIAGNOSIS — D509 Iron deficiency anemia, unspecified: Secondary | ICD-10-CM | POA: Diagnosis not present

## 2016-02-26 DIAGNOSIS — N2581 Secondary hyperparathyroidism of renal origin: Secondary | ICD-10-CM | POA: Diagnosis not present

## 2016-02-26 DIAGNOSIS — E440 Moderate protein-calorie malnutrition: Secondary | ICD-10-CM | POA: Diagnosis not present

## 2016-02-26 NOTE — Progress Notes (Signed)
01/15/2015- Last office note from Dr. Danie Binder from [facility] on chart. 02/07/2015-Exercise Treadmill Stress test on chart from [facility]. 01/22/2015- Echo from [facility] on chart.

## 2016-02-26 NOTE — Patient Instructions (Addendum)
Christopher Ritter  02/26/2016   Your procedure is scheduled on: Friday 03/05/2016  Report to [facility] Main  Entrance take [facility]  elevators to 3rd floor to  [place] at  100 PM.  Call this number if you have problems the morning of surgery [phone]   Remember: ONLY 1 PERSON MAY GO WITH YOU TO SHORT STAY TO GET  READY MORNING OF [name].   Do not eat food  :After Midnight.  MAY HAVE CLEAR LIQUIDS FROM MIDNIGHT UP UNTIL 0900 AM THEN NOTHING UNTIL AFTER SURGERY!     CLEAR LIQUID DIET   Foods Allowed                                                                     Foods Excluded  Coffee and tea, regular and decaf                             liquids that you cannot  Plain Jell-O in any flavor                                             see through such as: Fruit ices (not with fruit pulp)                                     milk, soups, orange juice  Iced Popsicles                                    All solid food Carbonated beverages, regular and diet                                    Cranberry, grape and apple juices Sports drinks like Gatorade Lightly seasoned clear broth or consume(fat free) Sugar, honey syrup  Sample Menu Breakfast                                Lunch                                     Supper Cranberry juice                    Beef broth                            Chicken broth Jell-O                                     Grape juice  Apple juice Coffee or tea                        Jell-O                                      Popsicle                                                Coffee or tea                        Coffee or tea  _____________________________________________________________________     Take these medicines the morning of surgery with A SIP OF WATER: Amlodipine, Carvedilol (Coreg), Pro-Air inhaler if needed, Sensipar                                 You may not have any  metal on your body including hair pins and              piercings  Do not wear jewelry, make-up, lotions, powders or perfumes, deodorant             Do not wear nail polish.  Do not shave  48 hours prior to surgery.              Men may shave face and neck.   Do not bring valuables to the hospital. Christopher Ritter.  Contacts, dentures or bridgework may not be worn into surgery.  Leave suitcase in the car. After surgery it may be brought to your room.                 Please read over the following fact sheets you were given: _____________________________________________________________________             [facility] - Preparing for Surgery Before surgery, you can play an important role.  Because skin is not sterile, your skin needs to be as free of germs as possible.  You can reduce the number of germs on your skin by washing with CHG (chlorahexidine gluconate) soap before surgery.  CHG is an antiseptic cleaner which kills germs and bonds with the skin to continue killing germs even after washing. Please DO NOT use if you have an allergy to CHG or antibacterial soaps.  If your skin becomes reddened/irritated stop using the CHG and inform your nurse when you arrive at Short Stay. Do not shave (including legs and underarms) for at least 48 hours prior to the first CHG shower.  You may shave your face/neck. Please follow these instructions carefully:  1.  Shower with CHG Soap the night before surgery and the  morning of Surgery.  2.  If you choose to wash your hair, wash your hair first as usual with your  normal  shampoo.  3.  After you shampoo, rinse your hair and body thoroughly to remove the  shampoo.                           4.  Use CHG as you would any other liquid soap.  You can apply chg directly  to the skin and wash                       Gently with a scrungie or clean washcloth.  5.  Apply the CHG Soap to your body ONLY FROM THE NECK DOWN.    Do not use on face/ open                           Wound or open sores. Avoid contact with eyes, ears mouth and genitals (private parts).                       Wash face,  Genitals (private parts) with your normal soap.             6.  Wash thoroughly, paying special attention to the area where your surgery  will be performed.  7.  Thoroughly rinse your body with warm water from the neck down.  8.  DO NOT shower/wash with your normal soap after using and rinsing off  the CHG Soap.                9.  Pat yourself dry with a clean towel.            10.  Wear clean pajamas.            11.  Place clean sheets on your bed the night of your first shower and do not  sleep with pets. Day of Surgery : Do not apply any lotions/deodorants the morning of surgery.  Please wear clean clothes to the hospital/surgery center.  FAILURE TO FOLLOW THESE INSTRUCTIONS MAY RESULT IN THE CANCELLATION OF YOUR SURGERY PATIENT SIGNATURE_________________________________  NURSE SIGNATURE__________________________________  ________________________________________________________________________   Christopher Ritter  An incentive spirometer is a tool that can help keep your lungs clear and active. This tool measures how well you are filling your lungs with each breath. Taking long deep breaths may help reverse or decrease the chance of developing breathing (pulmonary) problems (especially infection) following:  A long period of time when you are unable to move or be active. BEFORE THE PROCEDURE   If the spirometer includes an indicator to show your best effort, your nurse or respiratory therapist will set it to a desired goal.  If possible, sit up straight or lean slightly forward. Try not to slouch.  Hold the incentive spirometer in an upright position. INSTRUCTIONS FOR USE  1. Sit on the edge of your bed if possible, or sit up as far as you can in bed or on a chair. 2. Hold the incentive spirometer in an  upright position. 3. Breathe out normally. 4. Place the mouthpiece in your mouth and seal your lips tightly around it. 5. Breathe in slowly and as deeply as possible, raising the piston or the ball toward the top of the column. 6. Hold your breath for 3-5 seconds or for as long as possible. Allow the piston or ball to fall to the bottom of the column. 7. Remove the mouthpiece from your mouth and breathe out normally. 8. Rest for a few seconds and repeat Steps 1 through 7 at least 10 times every 1-2 hours when you are awake. Take your time and take a few normal breaths between deep breaths. 9. The spirometer may include an indicator to show your  best effort. Use the indicator as a goal to work toward during each repetition. 10. After each set of 10 deep breaths, practice coughing to be sure your lungs are clear. If you have an incision (the cut made at the time of surgery), support your incision when coughing by placing a pillow or rolled up towels firmly against it. Once you are able to get out of bed, walk around indoors and cough well. You may stop using the incentive spirometer when instructed by your caregiver.  RISKS AND COMPLICATIONS  Take your time so you do not get dizzy or light-headed.  If you are in pain, you may need to take or ask for pain medication before doing incentive spirometry. It is harder to take a deep breath if you are having pain. AFTER USE  Rest and breathe slowly and easily.  It can be helpful to keep track of a log of your progress. Your caregiver can provide you with a simple table to help with this. If you are using the spirometer at home, follow these instructions: [place] IF:   You are having difficultly using the spirometer.  You have trouble using the spirometer as often as instructed.  Your pain medication is not giving enough relief while using the spirometer.  You develop fever of 100.5 F (38.1 C) or higher. SEEK IMMEDIATE MEDICAL CARE  IF:   You cough up bloody sputum that had not been present before.  You develop fever of 102 F (38.9 C) or greater.  You develop worsening pain at or near the incision site. MAKE SURE YOU:   Understand these instructions.  Will watch your condition.  Will get help right away if you are not doing well or get worse. Document Released: 11/01/2006 Document Revised: 09/13/2011 Document Reviewed: 01/02/2007 Exit[employer] Patient Information 2014 Exit[employer], [place].   ________________________________________________________________________  WHAT IS A BLOOD TRANSFUSION? Blood Transfusion Information  A transfusion is the replacement of blood or some of its parts. Blood is made up of multiple cells which provide different functions.  Red blood cells carry oxygen and are used for blood loss replacement.  White blood cells fight against infection.  Platelets control bleeding.  Plasma helps clot blood.  Other blood products are available for specialized needs, such as hemophilia or other clotting disorders. BEFORE THE TRANSFUSION  Who gives blood for transfusions?   Healthy volunteers who are fully evaluated to make sure their blood is safe. This is blood bank blood. Transfusion therapy is the safest it has ever been in the practice of medicine. Before blood is taken from a donor, a complete history is taken to make sure that person has no history of diseases nor engages in risky social behavior (examples are intravenous drug use or sexual activity with multiple partners). The donor's travel history is screened to minimize risk of transmitting infections, such as malaria. The donated blood is tested for signs of infectious diseases, such as HIV and hepatitis. The blood is then tested to be sure it is compatible with you in order to minimize the chance of a transfusion reaction. If you or a relative donates blood, this is often done in anticipation of surgery and is not appropriate for emergency  situations. It takes many days to process the donated blood. RISKS AND COMPLICATIONS Although transfusion therapy is very safe and saves many lives, the main dangers of transfusion include:   Getting an infectious disease.  Developing a transfusion reaction. This is an allergic reaction to something  in the blood you were given. Every precaution is taken to prevent this. The decision to have a blood transfusion has been considered carefully by your caregiver before blood is given. Blood is not given unless the benefits outweigh the risks. AFTER THE TRANSFUSION  Right after receiving a blood transfusion, you will usually feel much better and more energetic. This is especially true if your red blood cells have gotten low (anemic). The transfusion raises the level of the red blood cells which carry oxygen, and this usually causes an energy increase.  The nurse administering the transfusion will monitor you carefully for complications. HOME CARE INSTRUCTIONS  No special instructions are needed after a transfusion. You may find your energy is better. Speak with your caregiver about any limitations on activity for underlying diseases you may have. SEEK MEDICAL CARE IF:   Your condition is not improving after your transfusion.  You develop redness or irritation at the intravenous (IV) site. SEEK IMMEDIATE MEDICAL CARE IF:  Any of the following symptoms occur over the next 12 hours:  Shaking chills.  You have a temperature by mouth above 102 F (38.9 C), not controlled by medicine.  Chest, back, or muscle pain.  People around you feel you are not acting correctly or are confused.  Shortness of breath or difficulty breathing.  Dizziness and fainting.  You get a rash or develop hives.  You have a decrease in urine output.  Your urine turns a dark color or changes to pink, red, or brown. Any of the following symptoms occur over the next 10 days:  You have a temperature by mouth above  102 F (38.9 C), not controlled by medicine.  Shortness of breath.  Weakness after normal activity.  The white part of the eye turns yellow (jaundice).  You have a decrease in the amount of urine or are urinating less often.  Your urine turns a dark color or changes to pink, red, or brown. Document Released: 06/18/2000 Document Revised: 09/13/2011 Document Reviewed: 02/05/2008 [facility] Patient Information 2014 [place], [place].  _______________________________________________________________________

## 2016-02-27 ENCOUNTER — Encounter (HOSPITAL_COMMUNITY): Payer: Self-pay

## 2016-02-27 ENCOUNTER — Encounter (HOSPITAL_COMMUNITY)
Admission: RE | Admit: 2016-02-27 | Discharge: 2016-02-27 | Disposition: A | Discharge status: Home / Self Care | Payer: Medicare Other | Source: Ambulatory Visit | Attending: Orthopedic Surgery | Admitting: Orthopedic Surgery

## 2016-02-27 ENCOUNTER — Other Ambulatory Visit (HOSPITAL_COMMUNITY): Payer: Medicare Other

## 2016-02-27 DIAGNOSIS — M87851 Other osteonecrosis, right femur: Secondary | ICD-10-CM | POA: Insufficient documentation

## 2016-02-27 DIAGNOSIS — N186 End stage renal disease: Secondary | ICD-10-CM | POA: Diagnosis not present

## 2016-02-27 DIAGNOSIS — K7689 Other specified diseases of liver: Secondary | ICD-10-CM | POA: Diagnosis not present

## 2016-02-27 DIAGNOSIS — Z01812 Encounter for preprocedural laboratory examination: Secondary | ICD-10-CM | POA: Diagnosis not present

## 2016-02-27 DIAGNOSIS — N2581 Secondary hyperparathyroidism of renal origin: Secondary | ICD-10-CM | POA: Diagnosis not present

## 2016-02-27 DIAGNOSIS — D509 Iron deficiency anemia, unspecified: Secondary | ICD-10-CM | POA: Diagnosis not present

## 2016-02-27 DIAGNOSIS — E440 Moderate protein-calorie malnutrition: Secondary | ICD-10-CM | POA: Diagnosis not present

## 2016-02-27 DIAGNOSIS — D631 Anemia in chronic kidney disease: Secondary | ICD-10-CM | POA: Diagnosis not present

## 2016-02-27 HISTORY — DX: Disorder of parathyroid gland, unspecified: E21.5

## 2016-02-27 LAB — URINALYSIS, ROUTINE W REFLEX MICROSCOPIC
Bilirubin Urine: NEGATIVE
Glucose, UA: NEGATIVE mg/dL
Ketones, ur: NEGATIVE mg/dL
Leukocytes, UA: NEGATIVE
Nitrite: NEGATIVE
Protein, ur: 30 mg/dL — AB
Specific Gravity, Urine: 1.014 (ref 1.005–1.030)
pH: 6 (ref 5.0–8.0)

## 2016-02-27 LAB — SURGICAL PCR SCREEN
MRSA, PCR: NEGATIVE
STAPHYLOCOCCUS AUREUS: NEGATIVE

## 2016-02-27 LAB — URINE MICROSCOPIC-ADD ON

## 2016-02-27 NOTE — Progress Notes (Signed)
   02/27/16 1021  OBSTRUCTIVE SLEEP APNEA  Have you ever been diagnosed with sleep apnea through a sleep study? No  Do you snore loudly (loud enough to be heard through closed doors)?  1  Do you often feel tired, fatigued, or sleepy during the daytime (such as falling asleep during driving or talking to someone)? 1  Has anyone observed you stop breathing during your sleep? 1  Do you have, or are you being treated for high blood pressure? 1  BMI more than 35 kg/m2? 0  Age > 50 (1-yes) 0  Neck circumference greater than:Male 16 inches or larger, Male 17inches or larger? 1  Male Gender (Yes=1) 1  Obstructive Sleep Apnea Score 6  Score 5 or greater  Results sent to PCP

## 2016-03-01 DIAGNOSIS — D509 Iron deficiency anemia, unspecified: Secondary | ICD-10-CM | POA: Diagnosis not present

## 2016-03-01 DIAGNOSIS — N186 End stage renal disease: Secondary | ICD-10-CM | POA: Diagnosis not present

## 2016-03-01 DIAGNOSIS — D631 Anemia in chronic kidney disease: Secondary | ICD-10-CM | POA: Diagnosis not present

## 2016-03-01 DIAGNOSIS — E440 Moderate protein-calorie malnutrition: Secondary | ICD-10-CM | POA: Diagnosis not present

## 2016-03-01 DIAGNOSIS — N2581 Secondary hyperparathyroidism of renal origin: Secondary | ICD-10-CM | POA: Diagnosis not present

## 2016-03-01 DIAGNOSIS — K7689 Other specified diseases of liver: Secondary | ICD-10-CM | POA: Diagnosis not present

## 2016-03-02 DIAGNOSIS — N2581 Secondary hyperparathyroidism of renal origin: Secondary | ICD-10-CM | POA: Diagnosis not present

## 2016-03-02 DIAGNOSIS — N186 End stage renal disease: Secondary | ICD-10-CM | POA: Diagnosis not present

## 2016-03-02 DIAGNOSIS — D631 Anemia in chronic kidney disease: Secondary | ICD-10-CM | POA: Diagnosis not present

## 2016-03-02 DIAGNOSIS — E440 Moderate protein-calorie malnutrition: Secondary | ICD-10-CM | POA: Diagnosis not present

## 2016-03-02 DIAGNOSIS — D509 Iron deficiency anemia, unspecified: Secondary | ICD-10-CM | POA: Diagnosis not present

## 2016-03-02 DIAGNOSIS — K7689 Other specified diseases of liver: Secondary | ICD-10-CM | POA: Diagnosis not present

## 2016-03-03 NOTE — H&P (Signed)
TOTAL HIP ADMISSION H&P  Patient is admitted for right total hip arthroplasty, anterior approach.  Subjective:  Chief Complaint:     Right hip AVN / pain  HPI: Christopher Ritter, 45 y.o. male, has a history of pain and functional disability in the right hip(s) due to arthritis and AVN and patient has failed non-surgical conservative treatments for greater than 12 weeks to include NSAID's and/or analgesics, corticosteriod injections, use of assistive devices and activity modification.  Onset of symptoms was gradual starting 5+ years ago with gradually worsening course since that time.The patient noted no past surgery on the right hip(s).  Patient currently rates pain in the right hip at 8 out of 10 with activity. Patient has night pain, worsening of pain with activity and weight bearing, trendelenberg gait, pain that interfers with activities of daily living and pain with passive range of motion. Patient has evidence of periarticular osteophytes, joint space narrowing and AVN by imaging studies. This condition presents safety issues increasing the risk of falls.  There is no current active infection.  Risks, benefits and expectations were discussed with the patient.  Risks including but not limited to the risk of anesthesia, blood clots, nerve damage, blood vessel damage, failure of the prosthesis, infection and up to and including death.  Patient understand the risks, benefits and expectations and wishes to proceed with surgery.   PCP: Angelina Sheriff., MD  D/C Plans:      Home with HHPT  Post-op Meds:       No Rx given  Tranexamic Acid:      To be given - IV  Decadron:      Is to be given  FYI:     ASA  (? - On dialysis at home)  [place]    Patient Active Problem List   Diagnosis Date Noted  . Venous stenosis of left upper extremity 01/24/2015  . Mechanical complication of other vascular device, implant, and graft 11/23/2012  . End stage renal disease ([place]) 07/29/2011  . Other  complications due to renal dialysis device, implant, and graft 07/29/2011  . ESRD on dialysis [facility]) 06/09/2011   Past Medical History:  Diagnosis Date  . Allergy   . Anemia    Iron deficiency  . Chronic headaches    posterior occipital headaches  . Chronic kidney disease   . Constipation   . Coronary artery disease    Mitral Valve Prolapse- per Cardiologist Dr. Bettina Gavia  . GERD (gastroesophageal reflux disease)    hx of- no meds currently  . Heart murmur    Mitral Valveprolasp  . History of Cytoxan exposure   . History of renal calculi   . Hypertension   . Parathyroid abnormality ([place])   . Shortness of breath dyspnea    shortness of breath at times related to dialysis  . Thyroid disease     Past Surgical History:  Procedure Laterality Date  . AV FISTULA PLACEMENT    . DIATEK PLACED AND REMOVED     UNTIL FISTULA MATURED  . PERIPHERAL VASCULAR CATHETERIZATION N/A 03/20/2015   Procedure: A/V Fistulagram;  Surgeon: Conrad Brookhaven, MD;  Location: [place] [facility];  Service: Cardiovascular;  Laterality: N/A;  . REVISON OF ARTERIOVENOUS FISTULA Left 11/28/2012   Procedure: REVISON OF ARTERIOVENOUS FISTULA;  Surgeon: Elam Dutch, MD;  Location: [place];  Service: Vascular;  Laterality: Left;  Plication   . REVISON OF ARTERIOVENOUS FISTULA Left 07/02/2015   Procedure: Pseudoaneurysm Repair of Left  Upper Arm Fistula;  Surgeon: Conrad Cramerton, MD;  Location: [place];  Service: Vascular;  Laterality: Left;  . THROMBECTOMY W/ EMBOLECTOMY  07/08/2011   Procedure: THROMBECTOMY ARTERIOVENOUS FISTULA;  Surgeon: Mal Misty, MD;  Location: [place];  Service: Vascular;  Laterality: Left;  Incision of Ulcerated Skin and Revision of Left Upper Arm Fistula    No prescriptions prior to admission.   Allergies  Allergen Reactions  . Lisinopril Cough    Social History  Substance Use Topics  . Smoking status: Never Smoker  . Smokeless tobacco: Never Used  . Alcohol use No    Family History   Problem Relation Age of Onset  . Anesthesia problems Neg Hx      Review of Systems  Constitutional: Negative.   Eyes: Negative.   Respiratory: Positive for shortness of breath (with exertion).   Cardiovascular: Negative.   Gastrointestinal: Positive for constipation and heartburn.  Genitourinary: Negative.   Musculoskeletal: Positive for joint pain.  Skin: Negative.   Neurological: Positive for headaches.  Endo/Heme/Allergies: Positive for environmental allergies.  Psychiatric/Behavioral: Negative.     Objective:  Physical Exam  Constitutional: He is oriented to person, place, and time. He appears well-developed.  HENT:  Head: Normocephalic.  Eyes: Pupils are equal, round, and reactive to light.  Neck: Neck supple. No JVD present. No tracheal deviation present. No thyromegaly present.  Cardiovascular: Normal rate, regular rhythm and intact distal pulses.   Murmur heard. Respiratory: Effort normal and breath sounds normal. No respiratory distress. He has no wheezes.  GI: Soft. There is no tenderness. There is no guarding.  Musculoskeletal:       Right hip: He exhibits decreased range of motion, decreased strength, tenderness and bony tenderness. He exhibits no swelling, no deformity and no laceration.  Lymphadenopathy:    He has no cervical adenopathy.  Neurological: He is alert and oriented to person, place, and time.  Skin: Skin is warm and dry.  Psychiatric: He has a normal mood and affect.      Labs:  Estimated body mass index is 18.14 kg/m as calculated from the following:   Height as of 02/27/16: 6' 2.5" (1.892 m).   Weight as of 02/27/16: 65 kg (143 lb 3.2 oz).   Imaging Review Plain radiographs demonstrate severe degenerative joint disease of the right hip(s). The bone quality appears to be good for age and reported activity level.  Assessment/Plan:  End stage arthritis, right hip(s)  The patient history, physical examination, clinical judgement of the  provider and imaging studies are consistent with end stage degenerative joint disease of the right hip(s) and total hip arthroplasty is deemed medically necessary. The treatment options including medical management, injection therapy, arthroscopy and arthroplasty were discussed at length. The risks and benefits of total hip arthroplasty were presented and reviewed. The risks due to aseptic loosening, infection, stiffness, dislocation/subluxation,  thromboembolic complications and other imponderables were discussed.  The patient acknowledged the explanation, agreed to proceed with the plan and consent was signed. Patient is being admitted for inpatient treatment for surgery, pain control, PT, OT, prophylactic antibiotics, VTE prophylaxis, progressive ambulation and ADL's and discharge planning.The patient is planning to be discharged home with home health services.     [place] Joanne Salah   PA-C  03/03/2016, 4:26 PM

## 2016-03-04 DIAGNOSIS — K7689 Other specified diseases of liver: Secondary | ICD-10-CM | POA: Diagnosis not present

## 2016-03-04 DIAGNOSIS — D509 Iron deficiency anemia, unspecified: Secondary | ICD-10-CM | POA: Diagnosis not present

## 2016-03-04 DIAGNOSIS — Z992 Dependence on renal dialysis: Secondary | ICD-10-CM | POA: Diagnosis not present

## 2016-03-04 DIAGNOSIS — E440 Moderate protein-calorie malnutrition: Secondary | ICD-10-CM | POA: Diagnosis not present

## 2016-03-04 DIAGNOSIS — N2581 Secondary hyperparathyroidism of renal origin: Secondary | ICD-10-CM | POA: Diagnosis not present

## 2016-03-04 DIAGNOSIS — N08 Glomerular disorders in diseases classified elsewhere: Secondary | ICD-10-CM | POA: Diagnosis not present

## 2016-03-04 DIAGNOSIS — D631 Anemia in chronic kidney disease: Secondary | ICD-10-CM | POA: Diagnosis not present

## 2016-03-04 DIAGNOSIS — N186 End stage renal disease: Secondary | ICD-10-CM | POA: Diagnosis not present

## 2016-03-05 ENCOUNTER — Encounter (HOSPITAL_COMMUNITY)
Admission: RE | Disposition: A | Discharge status: Home / Self Care | Payer: Self-pay | Source: Ambulatory Visit | Attending: Orthopedic Surgery

## 2016-03-05 ENCOUNTER — Inpatient Hospital Stay (HOSPITAL_COMMUNITY): Payer: Medicare Other

## 2016-03-05 ENCOUNTER — Encounter (HOSPITAL_COMMUNITY): Payer: Self-pay | Admitting: *Deleted

## 2016-03-05 ENCOUNTER — Inpatient Hospital Stay (HOSPITAL_COMMUNITY)
Admission: RE | Admit: 2016-03-05 | Discharge: 2016-03-10 | DRG: 469 | Disposition: A | Discharge status: Home / Self Care | Payer: Medicare Other | Source: Ambulatory Visit | Attending: Orthopedic Surgery | Admitting: Orthopedic Surgery

## 2016-03-05 ENCOUNTER — Inpatient Hospital Stay (HOSPITAL_COMMUNITY): Payer: Medicare Other | Admitting: Anesthesiology

## 2016-03-05 DIAGNOSIS — Z888 Allergy status to other drugs, medicaments and biological substances status: Secondary | ICD-10-CM

## 2016-03-05 DIAGNOSIS — I2510 Atherosclerotic heart disease of native coronary artery without angina pectoris: Secondary | ICD-10-CM | POA: Diagnosis present

## 2016-03-05 DIAGNOSIS — E8889 Other specified metabolic disorders: Secondary | ICD-10-CM | POA: Diagnosis not present

## 2016-03-05 DIAGNOSIS — Z96641 Presence of right artificial hip joint: Secondary | ICD-10-CM

## 2016-03-05 DIAGNOSIS — M1611 Unilateral primary osteoarthritis, right hip: Secondary | ICD-10-CM | POA: Diagnosis not present

## 2016-03-05 DIAGNOSIS — E875 Hyperkalemia: Secondary | ICD-10-CM | POA: Diagnosis not present

## 2016-03-05 DIAGNOSIS — D630 Anemia in neoplastic disease: Secondary | ICD-10-CM | POA: Diagnosis not present

## 2016-03-05 DIAGNOSIS — M87851 Other osteonecrosis, right femur: Secondary | ICD-10-CM | POA: Diagnosis not present

## 2016-03-05 DIAGNOSIS — N028 Recurrent and persistent hematuria with other morphologic changes: Secondary | ICD-10-CM | POA: Diagnosis present

## 2016-03-05 DIAGNOSIS — I120 Hypertensive chronic kidney disease with stage 5 chronic kidney disease or end stage renal disease: Secondary | ICD-10-CM | POA: Diagnosis present

## 2016-03-05 DIAGNOSIS — D631 Anemia in chronic kidney disease: Secondary | ICD-10-CM | POA: Diagnosis present

## 2016-03-05 DIAGNOSIS — Z992 Dependence on renal dialysis: Secondary | ICD-10-CM

## 2016-03-05 DIAGNOSIS — D509 Iron deficiency anemia, unspecified: Secondary | ICD-10-CM | POA: Diagnosis not present

## 2016-03-05 DIAGNOSIS — E440 Moderate protein-calorie malnutrition: Secondary | ICD-10-CM | POA: Diagnosis not present

## 2016-03-05 DIAGNOSIS — Z79899 Other long term (current) drug therapy: Secondary | ICD-10-CM

## 2016-03-05 DIAGNOSIS — D62 Acute posthemorrhagic anemia: Secondary | ICD-10-CM | POA: Diagnosis not present

## 2016-03-05 DIAGNOSIS — E8770 Fluid overload, unspecified: Secondary | ICD-10-CM | POA: Diagnosis not present

## 2016-03-05 DIAGNOSIS — N2581 Secondary hyperparathyroidism of renal origin: Secondary | ICD-10-CM | POA: Diagnosis not present

## 2016-03-05 DIAGNOSIS — Z471 Aftercare following joint replacement surgery: Secondary | ICD-10-CM | POA: Diagnosis not present

## 2016-03-05 DIAGNOSIS — M25551 Pain in right hip: Secondary | ICD-10-CM | POA: Diagnosis not present

## 2016-03-05 DIAGNOSIS — N186 End stage renal disease: Secondary | ICD-10-CM | POA: Diagnosis present

## 2016-03-05 DIAGNOSIS — M87051 Idiopathic aseptic necrosis of right femur: Secondary | ICD-10-CM | POA: Diagnosis not present

## 2016-03-05 HISTORY — PX: TOTAL HIP ARTHROPLASTY: SHX124

## 2016-03-05 LAB — CBC WITH DIFFERENTIAL/PLATELET
Basophils Absolute: 0 10*3/uL (ref 0.0–0.1)
Basophils Relative: 1 %
Eosinophils Absolute: 0 10*3/uL (ref 0.0–0.7)
Eosinophils Relative: 0 %
HCT: 34.7 % — ABNORMAL LOW (ref 39.0–52.0)
Hemoglobin: 11.4 g/dL — ABNORMAL LOW (ref 13.0–17.0)
Lymphocytes Relative: 12 %
Lymphs Abs: 0.7 10*3/uL (ref 0.7–4.0)
MCH: 32.6 pg (ref 26.0–34.0)
MCHC: 32.9 g/dL (ref 30.0–36.0)
MCV: 99.1 fL (ref 78.0–100.0)
Monocytes Absolute: 0.7 10*3/uL (ref 0.1–1.0)
Monocytes Relative: 13 %
Neutro Abs: 4.3 10*3/uL (ref 1.7–7.7)
Neutrophils Relative %: 74 %
Platelets: 185 10*3/uL (ref 150–400)
RBC: 3.5 MIL/uL — ABNORMAL LOW (ref 4.22–5.81)
RDW: 14.7 % (ref 11.5–15.5)
WBC: 5.7 10*3/uL (ref 4.0–10.5)

## 2016-03-05 LAB — COMPREHENSIVE METABOLIC PANEL
ALT: 11 U/L — ABNORMAL LOW (ref 17–63)
AST: 12 U/L — ABNORMAL LOW (ref 15–41)
Albumin: 4.4 g/dL (ref 3.5–5.0)
Alkaline Phosphatase: 54 U/L (ref 38–126)
Anion gap: 13 (ref 5–15)
BUN: 48 mg/dL — ABNORMAL HIGH (ref 6–20)
CO2: 21 mmol/L — ABNORMAL LOW (ref 22–32)
Calcium: 9.1 mg/dL (ref 8.9–10.3)
Chloride: 100 mmol/L — ABNORMAL LOW (ref 101–111)
Creatinine, Ser: 11.56 mg/dL — ABNORMAL HIGH (ref 0.61–1.24)
GFR calc Af Amer: 5 mL/min — ABNORMAL LOW (ref >60)
GFR calc non Af Amer: 5 mL/min — ABNORMAL LOW (ref >60)
Glucose, Bld: 85 mg/dL (ref 65–99)
Potassium: 4.4 mmol/L (ref 3.5–5.1)
Sodium: 134 mmol/L — ABNORMAL LOW (ref 135–145)
Total Bilirubin: 0.8 mg/dL (ref 0.3–1.2)
Total Protein: 7.8 g/dL (ref 6.5–8.1)

## 2016-03-05 LAB — PROTIME-INR
INR: 1.04
Prothrombin Time: 13.6 seconds (ref 11.4–15.2)

## 2016-03-05 LAB — ABO/RH: ABO/RH(D): O POS

## 2016-03-05 SURGERY — ARTHROPLASTY, HIP, TOTAL, ANTERIOR APPROACH
Anesthesia: Spinal | Site: Hip | Laterality: Right

## 2016-03-05 MED ORDER — MIDAZOLAM HCL 5 MG/5ML IJ SOLN
INTRAMUSCULAR | Status: DC | PRN
  Administered 2016-03-05 (×2): 1 mg via INTRAVENOUS

## 2016-03-05 MED ORDER — CHLORHEXIDINE GLUCONATE 4 % EX LIQD: 60.0000 mL | Freq: Once | CUTANEOUS | Status: DC

## 2016-03-05 MED ORDER — HYDROMORPHONE HCL 1 MG/ML IJ SOLN
0.5000 mg | INTRAMUSCULAR | Status: DC | PRN
  Administered 2016-03-05 – 2016-03-08 (×6): 1 mg via INTRAVENOUS
  Filled 2016-03-05 (×7): qty 1

## 2016-03-05 MED ORDER — MEPERIDINE HCL 50 MG/ML IJ SOLN: 6.2500 mg | INTRAMUSCULAR | Status: DC | PRN

## 2016-03-05 MED ORDER — LORATADINE 10 MG PO TABS: 10.0000 mg | ORAL_TABLET | Freq: Every day | ORAL | Status: DC | PRN

## 2016-03-05 MED ORDER — DEXAMETHASONE SODIUM PHOSPHATE 10 MG/ML IJ SOLN
INTRAMUSCULAR | Status: DC | PRN
  Administered 2016-03-05: 10 mg via INTRAVENOUS

## 2016-03-05 MED ORDER — ACETAMINOPHEN 10 MG/ML IV SOLN
INTRAVENOUS | Status: DC | PRN
  Administered 2016-03-05: 1000 mg via INTRAVENOUS

## 2016-03-05 MED ORDER — SODIUM CHLORIDE 0.9 % IV BOLUS (SEPSIS)
250.0000 mL | Freq: Once | INTRAVENOUS | Status: AC
  Administered 2016-03-05: 250 mL via INTRAVENOUS

## 2016-03-05 MED ORDER — FENTANYL CITRATE (PF) 100 MCG/2ML IJ SOLN
INTRAMUSCULAR | Status: DC | PRN
  Administered 2016-03-05 (×2): 50 ug via INTRAVENOUS

## 2016-03-05 MED ORDER — PROMETHAZINE HCL 25 MG PO TABS: 25.0000 mg | ORAL_TABLET | Freq: Three times a day (TID) | ORAL | Status: DC | PRN

## 2016-03-05 MED ORDER — HYDROCODONE-ACETAMINOPHEN 7.5-325 MG PO TABS: 1.0000 | ORAL_TABLET | ORAL | 0 refills | Status: AC | PRN

## 2016-03-05 MED ORDER — CEFAZOLIN SODIUM-DEXTROSE 2-4 GM/100ML-% IV SOLN
2.0000 g | INTRAVENOUS | Status: AC
  Administered 2016-03-05: 2 g via INTRAVENOUS
  Filled 2016-03-05: qty 100

## 2016-03-05 MED ORDER — PHENOL 1.4 % MT LIQD: 1.0000 | OROMUCOSAL | Status: DC | PRN

## 2016-03-05 MED ORDER — METOCLOPRAMIDE HCL 5 MG PO TABS: 5.0000 mg | ORAL_TABLET | Freq: Three times a day (TID) | ORAL | Status: DC | PRN

## 2016-03-05 MED ORDER — PROPOFOL 10 MG/ML IV BOLUS
INTRAVENOUS | Status: AC
  Filled 2016-03-05: qty 60

## 2016-03-05 MED ORDER — CARVEDILOL 6.25 MG PO TABS: 6.2500 mg | ORAL_TABLET | ORAL | Status: DC | PRN

## 2016-03-05 MED ORDER — PROPOFOL 10 MG/ML IV BOLUS
INTRAVENOUS | Status: DC | PRN
  Administered 2016-03-05 (×2): 10 mg via INTRAVENOUS

## 2016-03-05 MED ORDER — FAMOTIDINE 20 MG PO TABS: 20.0000 mg | ORAL_TABLET | Freq: Two times a day (BID) | ORAL | Status: DC

## 2016-03-05 MED ORDER — ONDANSETRON HCL 4 MG PO TABS: 4.0000 mg | ORAL_TABLET | Freq: Four times a day (QID) | ORAL | Status: DC | PRN

## 2016-03-05 MED ORDER — SODIUM CHLORIDE 0.9 % IR SOLN
Status: DC | PRN
  Administered 2016-03-05: 1000 mL

## 2016-03-05 MED ORDER — CINACALCET HCL 30 MG PO TABS
90.0000 mg | ORAL_TABLET | Freq: Every day | ORAL | Status: DC
  Administered 2016-03-06 – 2016-03-07 (×2): 90 mg via ORAL
  Filled 2016-03-05 (×2): qty 3

## 2016-03-05 MED ORDER — ACETAMINOPHEN 650 MG RE SUPP
650.0000 mg | Freq: Four times a day (QID) | RECTAL | Status: DC | PRN
Start: 2016-03-05 — End: 2016-03-10

## 2016-03-05 MED ORDER — LIDOCAINE HCL (CARDIAC) 20 MG/ML IV SOLN
INTRAVENOUS | Status: DC | PRN
  Administered 2016-03-05: 100 mg via INTRAVENOUS

## 2016-03-05 MED ORDER — MIDAZOLAM HCL 2 MG/2ML IJ SOLN
INTRAMUSCULAR | Status: AC
  Filled 2016-03-05: qty 2

## 2016-03-05 MED ORDER — ONDANSETRON HCL 4 MG/2ML IJ SOLN
4.0000 mg | Freq: Four times a day (QID) | INTRAMUSCULAR | Status: DC | PRN
  Administered 2016-03-06: 4 mg via INTRAVENOUS
  Filled 2016-03-05: qty 2

## 2016-03-05 MED ORDER — ALBUTEROL SULFATE (2.5 MG/3ML) 0.083% IN NEBU: 3.0000 mL | INHALATION_SOLUTION | Freq: Four times a day (QID) | RESPIRATORY_TRACT | Status: DC | PRN

## 2016-03-05 MED ORDER — PHENYLEPHRINE HCL 10 MG/ML IJ SOLN
INTRAMUSCULAR | Status: DC | PRN
  Administered 2016-03-05: 15 ug/min via INTRAVENOUS

## 2016-03-05 MED ORDER — POLYETHYLENE GLYCOL 3350 17 G PO PACK
17.0000 g | PACK | Freq: Every day | ORAL | Status: DC | PRN
  Administered 2016-03-07 – 2016-03-09 (×3): 17 g via ORAL
  Filled 2016-03-05 (×3): qty 1

## 2016-03-05 MED ORDER — METHOCARBAMOL 1000 MG/10ML IJ SOLN: 500.0000 mg | Freq: Four times a day (QID) | INTRAVENOUS | Status: DC | PRN

## 2016-03-05 MED ORDER — HYDROCODONE-ACETAMINOPHEN 7.5-325 MG PO TABS
1.0000 | ORAL_TABLET | ORAL | Status: DC | PRN
  Administered 2016-03-05: 1 via ORAL
  Administered 2016-03-06 – 2016-03-09 (×7): 2 via ORAL
  Filled 2016-03-05 (×3): qty 2
  Filled 2016-03-05: qty 1
  Filled 2016-03-05: qty 2
  Filled 2016-03-05: qty 1
  Filled 2016-03-05 (×5): qty 2

## 2016-03-05 MED ORDER — STERILE WATER FOR IRRIGATION IR SOLN
Status: DC | PRN
  Administered 2016-03-05: 2000 mL

## 2016-03-05 MED ORDER — SODIUM CHLORIDE 0.9 % IV SOLN
INTRAVENOUS | Status: DC
  Administered 2016-03-05: 1000 mL via INTRAVENOUS
  Administered 2016-03-05: 17:00:00 via INTRAVENOUS

## 2016-03-05 MED ORDER — CALCIUM CARBONATE ANTACID 500 MG PO CHEW
800.0000 mg | CHEWABLE_TABLET | Freq: Three times a day (TID) | ORAL | Status: DC
  Administered 2016-03-06 – 2016-03-08 (×5): 800 mg via ORAL
  Filled 2016-03-05 (×8): qty 4

## 2016-03-05 MED ORDER — GLYCOPYRROLATE 0.2 MG/ML IJ SOLN
INTRAMUSCULAR | Status: DC | PRN
  Administered 2016-03-05: 0.2 mg via INTRAVENOUS

## 2016-03-05 MED ORDER — ACETAMINOPHEN 10 MG/ML IV SOLN
INTRAVENOUS | Status: AC
Start: 2016-03-05 — End: 2016-03-05
  Filled 2016-03-05: qty 100

## 2016-03-05 MED ORDER — ACETAMINOPHEN 325 MG PO TABS: 650.0000 mg | ORAL_TABLET | Freq: Four times a day (QID) | ORAL | Status: DC | PRN

## 2016-03-05 MED ORDER — SODIUM CHLORIDE 0.9 % IV SOLN: INTRAVENOUS | Status: DC

## 2016-03-05 MED ORDER — ONDANSETRON HCL 4 MG/2ML IJ SOLN
INTRAMUSCULAR | Status: DC | PRN
Start: 2016-03-05 — End: 2016-03-05
  Administered 2016-03-05: 4 mg via INTRAVENOUS

## 2016-03-05 MED ORDER — SIMVASTATIN 20 MG PO TABS
20.0000 mg | ORAL_TABLET | Freq: Every day | ORAL | Status: DC
  Administered 2016-03-05 – 2016-03-09 (×5): 20 mg via ORAL
  Filled 2016-03-05 (×5): qty 1

## 2016-03-05 MED ORDER — PROPOFOL 500 MG/50ML IV EMUL
INTRAVENOUS | Status: DC | PRN
  Administered 2016-03-05: 100 ug/kg/min via INTRAVENOUS

## 2016-03-05 MED ORDER — METHOCARBAMOL 500 MG PO TABS
500.0000 mg | ORAL_TABLET | Freq: Four times a day (QID) | ORAL | Status: DC | PRN
  Administered 2016-03-05 – 2016-03-09 (×5): 500 mg via ORAL
  Filled 2016-03-05 (×5): qty 1

## 2016-03-05 MED ORDER — CALCITRIOL 0.25 MCG PO CAPS
0.2500 ug | ORAL_CAPSULE | Freq: Every day | ORAL | Status: DC
  Administered 2016-03-06 – 2016-03-07 (×2): 0.25 ug via ORAL
  Filled 2016-03-05 (×3): qty 1

## 2016-03-05 MED ORDER — METHOCARBAMOL 500 MG PO TABS: 500.0000 mg | ORAL_TABLET | Freq: Four times a day (QID) | ORAL | 1 refills | Status: AC | PRN

## 2016-03-05 MED ORDER — CALCIUM CARBONATE ANTACID 500 MG PO CHEW
3.0000 | CHEWABLE_TABLET | Freq: Two times a day (BID) | ORAL | Status: DC | PRN
  Administered 2016-03-09 – 2016-03-10 (×3): 600 mg via ORAL
  Filled 2016-03-05 (×2): qty 3

## 2016-03-05 MED ORDER — PROMETHAZINE HCL 25 MG/ML IJ SOLN: 6.2500 mg | INTRAMUSCULAR | Status: DC | PRN

## 2016-03-05 MED ORDER — CEFAZOLIN IN D5W 1 GM/50ML IV SOLN
1.0000 g | Freq: Two times a day (BID) | INTRAVENOUS | Status: AC
  Administered 2016-03-06 (×2): 1 g via INTRAVENOUS
  Filled 2016-03-05 (×2): qty 50

## 2016-03-05 MED ORDER — FENTANYL CITRATE (PF) 100 MCG/2ML IJ SOLN
INTRAMUSCULAR | Status: AC
  Filled 2016-03-05: qty 2

## 2016-03-05 MED ORDER — AMLODIPINE BESYLATE 10 MG PO TABS
10.0000 mg | ORAL_TABLET | Freq: Every day | ORAL | Status: DC
  Administered 2016-03-06 – 2016-03-10 (×5): 10 mg via ORAL
  Filled 2016-03-05 (×5): qty 1

## 2016-03-05 MED ORDER — DOCUSATE SODIUM 100 MG PO CAPS
100.0000 mg | ORAL_CAPSULE | Freq: Two times a day (BID) | ORAL | Status: DC
  Administered 2016-03-05 – 2016-03-10 (×10): 100 mg via ORAL
  Filled 2016-03-05 (×10): qty 1

## 2016-03-05 MED ORDER — HYDROMORPHONE HCL 1 MG/ML IJ SOLN: 0.2500 mg | INTRAMUSCULAR | Status: DC | PRN

## 2016-03-05 MED ORDER — ASPIRIN 81 MG PO CHEW
81.0000 mg | CHEWABLE_TABLET | Freq: Two times a day (BID) | ORAL | Status: DC
  Administered 2016-03-05 – 2016-03-10 (×10): 81 mg via ORAL
  Filled 2016-03-05 (×10): qty 1

## 2016-03-05 MED ORDER — RENA-VITE PO TABS
1.0000 | ORAL_TABLET | Freq: Every day | ORAL | Status: DC
  Administered 2016-03-06 – 2016-03-10 (×5): 1 via ORAL
  Filled 2016-03-05 (×6): qty 1

## 2016-03-05 MED ORDER — DEXAMETHASONE SODIUM PHOSPHATE 10 MG/ML IJ SOLN
INTRAMUSCULAR | Status: AC
  Filled 2016-03-05: qty 1

## 2016-03-05 MED ORDER — CEFAZOLIN SODIUM-DEXTROSE 2-4 GM/100ML-% IV SOLN
INTRAVENOUS | Status: AC
  Filled 2016-03-05: qty 100

## 2016-03-05 MED ORDER — ASPIRIN 81 MG PO CHEW: 81.0000 mg | CHEWABLE_TABLET | Freq: Two times a day (BID) | ORAL | 1 refills | Status: AC

## 2016-03-05 MED ORDER — FAMOTIDINE 20 MG PO TABS
20.0000 mg | ORAL_TABLET | Freq: Every day | ORAL | Status: DC
  Administered 2016-03-06 – 2016-03-10 (×5): 20 mg via ORAL
  Filled 2016-03-05 (×5): qty 1

## 2016-03-05 MED ORDER — MENTHOL 3 MG MT LOZG: 1.0000 | LOZENGE | OROMUCOSAL | Status: DC | PRN

## 2016-03-05 MED ORDER — TRANEXAMIC ACID 1000 MG/10ML IV SOLN
1000.0000 mg | INTRAVENOUS | Status: AC
  Administered 2016-03-05: 1000 mg via INTRAVENOUS
  Filled 2016-03-05 (×2): qty 10

## 2016-03-05 MED ORDER — METOCLOPRAMIDE HCL 5 MG/ML IJ SOLN: 5.0000 mg | Freq: Three times a day (TID) | INTRAMUSCULAR | Status: DC | PRN

## 2016-03-05 SURGICAL SUPPLY — 34 items
BAG DECANTER FOR FLEXI CONT (MISCELLANEOUS) IMPLANT
BAG ZIPLOCK 12X15 (MISCELLANEOUS) IMPLANT
CAPT HIP TOTAL 2 ×2 IMPLANT
CLOTH BEACON ORANGE TIMEOUT ST (SAFETY) ×2 IMPLANT
COVER PERINEAL POST (MISCELLANEOUS) ×2 IMPLANT
DRAPE STERI IOBAN 125X83 (DRAPES) ×2 IMPLANT
DRAPE U-SHAPE 47X51 STRL (DRAPES) ×6 IMPLANT
DRESSING AQUACEL AG SP 3.5X10 (GAUZE/BANDAGES/DRESSINGS) ×1 IMPLANT
DRSG AQUACEL AG ADV 3.5X10 (GAUZE/BANDAGES/DRESSINGS) ×2 IMPLANT
DRSG AQUACEL AG SP 3.5X10 (GAUZE/BANDAGES/DRESSINGS) ×2
DURAPREP 26ML APPLICATOR (WOUND CARE) ×2 IMPLANT
ELECT REM PT RETURN 15FT ADLT (MISCELLANEOUS) IMPLANT
ELECT REM PT RETURN 9FT ADLT (ELECTROSURGICAL) ×2
ELECTRODE REM PT RTRN 9FT ADLT (ELECTROSURGICAL) ×1 IMPLANT
GLOVE BIOGEL M STRL SZ7.5 (GLOVE) IMPLANT
GLOVE BIOGEL PI IND STRL 7.5 (GLOVE) ×1 IMPLANT
GLOVE BIOGEL PI IND STRL 8.5 (GLOVE) ×1 IMPLANT
GLOVE BIOGEL PI INDICATOR 7.5 (GLOVE) ×1
GLOVE BIOGEL PI INDICATOR 8.5 (GLOVE) ×1
GLOVE ECLIPSE 8.0 STRL XLNG CF (GLOVE) ×4 IMPLANT
GLOVE ORTHO TXT STRL SZ7.5 (GLOVE) ×2 IMPLANT
GOWN STRL REUS W/TWL LRG LVL3 (GOWN DISPOSABLE) ×2 IMPLANT
GOWN STRL REUS W/TWL XL LVL3 (GOWN DISPOSABLE) ×2 IMPLANT
HOLDER FOLEY CATH W/STRAP (MISCELLANEOUS) ×2 IMPLANT
LIQUID BAND (GAUZE/BANDAGES/DRESSINGS) ×2 IMPLANT
PACK ANTERIOR HIP CUSTOM (KITS) ×2 IMPLANT
SAW OSC TIP CART 19.5X105X1.3 (SAW) ×2 IMPLANT
SUT MNCRL AB 4-0 PS2 18 (SUTURE) ×2 IMPLANT
SUT VIC AB 1 CT1 36 (SUTURE) ×6 IMPLANT
SUT VIC AB 2-0 CT1 27 (SUTURE) ×2
SUT VIC AB 2-0 CT1 TAPERPNT 27 (SUTURE) ×2 IMPLANT
SUT VLOC 180 0 24IN GS25 (SUTURE) ×2 IMPLANT
WATER STERILE IRR 1500ML POUR (IV SOLUTION) ×2 IMPLANT
YANKAUER SUCT BULB TIP 10FT TU (MISCELLANEOUS) IMPLANT

## 2016-03-05 NOTE — Progress Notes (Signed)
X-ray at bedside

## 2016-03-05 NOTE — Transfer of Care (Signed)
Immediate Anesthesia Transfer of Care Note  Patient: Christopher Ritter  Procedure(s) Performed: Procedure(s): TOTAL HIP ARTHROPLASTY ANTERIOR APPROACH (Right)  Patient Location: PACU  Anesthesia Type:Spinal  Level of Consciousness: awake, alert , oriented and patient cooperative  Airway & Oxygen Therapy: Patient Spontanous Breathing and Patient connected to face mask oxygen  Post-op Assessment: Report given to RN and Post -op Vital signs reviewed and stable  Post vital signs: stable  Last Vitals:  Vitals:   03/05/16 1656 03/05/16 1700  BP: 111/62 114/83  Pulse: 99 96  Resp: 16 14  Temp:      Last Pain:  Vitals:   03/05/16 1344  TempSrc: Oral      Patients Stated Pain Goal: 4 ([phone] [id number])  Complications: No apparent anesthesia complications  L5 spinal level

## 2016-03-05 NOTE — Op Note (Signed)
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      SURGEON:  Pietro Cassis. Alvan Dame, M.D.      ASSISTANT:       ANESTHESIA:  Spinal.      SPECIMENS:  None.      COMPLICATIONS:  None.      BLOOD LOSS:  600 cc     DRAINS:  None      INDICATION OF THE PROCEDURE:  Christopher MCGINITY is a 45 y.o. male who had   presented to office for evaluation of right hip pain.  Radiographs revealed significant findings consistent with avascular necrosis with femoral head collapse and complete loss of joint space with bone-on-bone   articulation to the  hip joint.  The patient had painful limited range of   motion significantly affecting their overall quality of life.  The patient was failing to    respond to conservative measures, and at this point was ready   to proceed with more definitive measures.  The patient has noted progressive   degenerative changes in his hip, progressive problems and dysfunction   with regarding the hip prior to surgery.  Consent was obtained for   benefit of pain relief.  Specific risk of infection, DVT, component   failure, dislocation, need for revision surgery, as well discussion of   the anterior versus posterior approach were reviewed.  Consent was   obtained for benefit of anterior pain relief through an anterior   approach.   He self dialyzes at home for ESRD.  Reviewed associated reported increased risks associated with ESRD patients.     PROCEDURE IN DETAIL:  The patient was brought to operative theater.   Once adequate anesthesia, preoperative antibiotics, 2gm of Ancef, 1 gm of Tranexamic Acid, 1 gm of IV Tyelonol administered.   The patient was positioned supine on the OSI Hanna table.  Once adequate   padding of boney process was carried out, we had predraped out the hip, and  used fluoroscopy to confirm orientation of the pelvis and position.      The right hip was then prepped and draped from proximal iliac crest to   mid thigh with shower curtain technique.      Time-out was performed identifying the patient, planned procedure, and   extremity.     An incision was then made 2 cm distal and lateral to the   anterior superior iliac spine extending over the orientation  of the   tensor fascia lata muscle and sharp dissection was carried down to the   fascia of the muscle and protractor placed in the soft tissues.      The fascia was then incised.  The muscle belly was identified and swept   laterally and retractor placed along the superior neck.  Following   cauterization of the circumflex vessels and removing some pericapsular   fat, a second cobra retractor was placed on the inferior neck.  A third   retractor was placed on the anterior acetabulum after elevating the   anterior rectus.  A capsulectomy was performed due to the significant intra-articular inflammation.  We then identified the trochanteric fossa and   orientation of my neck cut, confirmed this radiographically   and then made a neck osteotomy with the femur on traction.  The femoral   head was removed without difficulty or complication.  Traction was let   off and retractors were placed posterior and anterior around the   acetabulum.      The labrum and foveal tissue were debrided.  I began reaming with a 31mm   reamer and reamed up  to 77mm reamer with good bony bed preparation and a 46mm   cup was chosen.  The final 46mm Pinnacle cup was then impacted under fluoroscopy  to confirm the depth of penetration and orientation with respect to   abduction.  A screw was placed followed by the hole eliminator.  The final   36+4 neutral Altrex liner was impacted with good visualized rim fit.  The cup was positioned anatomically within the acetabular portion of the pelvis.      At this point, the femur was rolled at 80 degrees.  Further capsule was   released off the inferior aspect of the femoral neck.  I then   released the superior capsule proximally.  The hook was placed laterally   along the femur and elevated manually and held in position with the bed   hook.  The leg was then extended and adducted with the leg rolled to 100   degrees of external rotation.  Once the proximal femur was fully   exposed, I used a box osteotome to set orientation.  I then began   broaching with the starting chili pepper broach and passed this by hand and then broached up to 7.  With the 7 broach in place I chose a high offset neck and did trial reductions.  The offset was appropriate, leg lengths   appeared to be equal best matched with the 1.5 head ball confirmed radiographically.   Given these findings, I went ahead and dislocated the hip, repositioned all   retractors and positioned the right hip in the extended and abducted position.  The final 7 Hi Tri Lock stem was   chosen and it was impacted down to the level of neck cut.  Based on this   and the trial reduction, a 36+1.5 delta ceramic ball was chosen and   impacted onto a clean and dry trunnion, and the hip was reduced.  The   hip had been irrigated throughout the case again at this point.  The fascia of the   tensor fascia lata muscle was then reapproximated using #1 Vicryl and #0 V-lock sutures.  The   remaining wound was closed with 2-0 Vicryl and running 4-0 Monocryl.   The hip was  cleaned, dried, and dressed sterilely using Dermabond and   Aquacel dressing.  He was then brought   to recovery room in stable condition tolerating the procedure well.            Pietro Cassis Alvan Dame, M.D.        03/05/2016 2:45 PM

## 2016-03-05 NOTE — Anesthesia Postprocedure Evaluation (Signed)
Anesthesia Post Note  Patient: Christopher Ritter  Procedure(s) Performed: Procedure(s) (LRB): TOTAL HIP ARTHROPLASTY ANTERIOR APPROACH (Right)  Patient location during evaluation: PACU Anesthesia Type: Spinal and MAC Level of consciousness: awake and alert Pain management: pain level controlled Vital Signs Assessment: post-procedure vital signs reviewed and stable Respiratory status: spontaneous breathing and respiratory function stable Cardiovascular status: blood pressure returned to baseline and stable Postop Assessment: spinal receding Anesthetic complications: no    Last Vitals:  Vitals:   03/05/16 2002 03/05/16 2107  BP: (!) 87/60 (!) 93/50  Pulse: (!) 59 63  Resp: 12 12  Temp: 36.4 C 36.8 C    Last Pain:  Vitals:   03/05/16 2107  TempSrc: Oral  PainSc:                  Nolon Nations

## 2016-03-05 NOTE — Discharge Instructions (Signed)
INSTRUCTIONS AFTER JOINT REPLACEMENT  ° °o Remove items at home which could result in a fall. This includes throw rugs or furniture in walking pathways °o ICE to the affected joint every three hours while awake for 30 minutes at a time, for at least the first 3-5 days, and then as needed for pain and swelling.  Continue to use ice for pain and swelling. You may notice swelling that will progress down to the foot and ankle.  This is normal after surgery.  Elevate your leg when you are not up walking on it.   °o Continue to use the breathing machine you got in the hospital (incentive spirometer) which will help keep your temperature down.  It is common for your temperature to cycle up and down following surgery, especially at night when you are not up moving around and exerting yourself.  The breathing machine keeps your lungs expanded and your temperature down. ° ° °DIET:  As you were doing prior to hospitalization, we recommend a well-balanced diet. ° °DRESSING / WOUND CARE / SHOWERING ° °Keep the surgical dressing until follow up.  The dressing is water proof, so you can shower without any extra covering.  IF THE DRESSING FALLS OFF or the wound gets wet inside, change the dressing with sterile gauze.  Please use good hand washing techniques before changing the dressing.  Do not use any lotions or creams on the incision until instructed by your surgeon.   ° °ACTIVITY ° °o Increase activity slowly as tolerated, but follow the weight bearing instructions below.   °o No driving for 6 weeks or until further direction given by your physician.  You cannot drive while taking narcotics.  °o No lifting or carrying greater than 10 lbs. until further directed by your surgeon. °o Avoid periods of inactivity such as sitting longer than an hour when not asleep. This helps prevent blood clots.  °o You may return to work once you are authorized by your doctor.  ° ° ° °WEIGHT BEARING  ° °Weight bearing as tolerated with assist  device (walker, cane, etc) as directed, use it as long as suggested by your surgeon or therapist, typically at least 4-6 weeks. ° ° °EXERCISES ° °Results after joint replacement surgery are often greatly improved when you follow the exercise, range of motion and muscle strengthening exercises prescribed by your doctor. Safety measures are also important to protect the joint from further injury. Any time any of these exercises cause you to have increased pain or swelling, decrease what you are doing until you are comfortable again and then slowly increase them. If you have problems or questions, call your caregiver or physical therapist for advice.  ° °Rehabilitation is important following a joint replacement. After just a few days of immobilization, the muscles of the leg can become weakened and shrink (atrophy).  These exercises are designed to build up the tone and strength of the thigh and leg muscles and to improve motion. Often times heat used for twenty to thirty minutes before working out will loosen up your tissues and help with improving the range of motion but do not use heat for the first two weeks following surgery (sometimes heat can increase post-operative swelling).  ° °These exercises can be done on a training (exercise) mat, on the floor, on a table or on a bed. Use whatever works the best and is most comfortable for you.    Use music or television while you are exercising so that   the exercises are a pleasant break in your day. This will make your life better with the exercises acting as a break in your routine that you can look forward to.   Perform all exercises about fifteen times, three times per day or as directed.  You should exercise both the operative leg and the other leg as well.  Exercises include:    Quad Sets - Tighten up the muscle on the front of the thigh (Quad) and hold for 5-10 seconds.    Straight Leg Raises - With your knee straight (if you were given a brace, keep it on),  lift the leg to 60 degrees, hold for 3 seconds, and slowly lower the leg.  Perform this exercise against resistance later as your leg gets stronger.   Leg Slides: Lying on your back, slowly slide your foot toward your buttocks, bending your knee up off the floor (only go as far as is comfortable). Then slowly slide your foot back down until your leg is flat on the floor again.   Angel Wings: Lying on your back spread your legs to the side as far apart as you can without causing discomfort.   Hamstring Strength:  Lying on your back, push your heel against the floor with your leg straight by tightening up the muscles of your buttocks.  Repeat, but this time bend your knee to a comfortable angle, and push your heel against the floor.  You may put a pillow under the heel to make it more comfortable if necessary.   A rehabilitation program following joint replacement surgery can speed recovery and prevent re-injury in the future due to weakened muscles. Contact your doctor or a physical therapist for more information on knee rehabilitation.    CONSTIPATION  Constipation is defined medically as fewer than three stools per week and severe constipation as less than one stool per week.  Even if you have a regular bowel pattern at home, your normal regimen is likely to be disrupted due to multiple reasons following surgery.  Combination of anesthesia, postoperative narcotics, change in appetite and fluid intake all can affect your bowels.   YOU MUST use at least one of the following options; they are listed in order of increasing strength to get the job done.  They are all available over the counter, and you may need to use some, POSSIBLY even all of these options:    Drink plenty of fluids (prune juice may be helpful) and high fiber foods Colace 100 mg by mouth twice a day  Senokot for constipation as directed and as needed Dulcolax (bisacodyl), take with full glass of water  .  If you have tried all  these things and are unable to have a bowel movement in the first 3-4 days after surgery call either your surgeon or your primary doctor.    If you experience loose stools or diarrhea, hold the medications until you stool forms back up.  If your symptoms do not get better within 1 week or if they get worse, check with your doctor.  If you experience "the worst abdominal pain ever" or develop nausea or vomiting, please contact the office immediately for further recommendations for treatment.   ITCHING:  If you experience itching with your medications, try taking only a single pain pill, or even half a pain pill at a time.  You can also use Benadryl over the counter for itching or also to help with sleep.   TED HOSE STOCKINGS:  Use stockings on both legs until for at least 2 weeks or as directed by physician office. They may be removed at night for sleeping.  MEDICATIONS:  See your medication summary on the After Visit Summary that nursing will review with you.  You may have some home medications which will be placed on hold until you complete the course of blood thinner medication.  It is important for you to complete the blood thinner medication as prescribed.  PRECAUTIONS:  If you experience chest pain or shortness of breath - call 911 immediately for transfer to the hospital emergency department.   If you develop a fever greater that 101 F, purulent drainage from wound, increased redness or drainage from wound, foul odor from the wound/dressing, or calf pain - CONTACT YOUR SURGEON.                                                   FOLLOW-UP APPOINTMENTS:  If you do not already have a post-op appointment, please call the office for an appointment to be seen by your surgeon.  Guidelines for how soon to be seen are listed in your After Visit Summary, but are typically between 1-4 weeks after surgery.  OTHER INSTRUCTIONS:   Knee Replacement:  Do not place pillow under knee, focus on keeping the  knee straight while resting. CPM instructions: 0-90 degrees, 2 hours in the morning, 2 hours in the afternoon, and 2 hours in the evening. Place foam block, curve side up under heel at all times except when in CPM or when walking.  DO NOT modify, tear, cut, or change the foam block in any way.  MAKE SURE YOU:   Understand these instructions.   Get help right away if you are not doing well or get worse.    Thank you for letting us be a part of your medical care team.  It is a privilege we respect greatly.  We hope these instructions will help you stay on track for a fast and full recovery!

## 2016-03-05 NOTE — Anesthesia Preprocedure Evaluation (Addendum)
Anesthesia Evaluation  Patient identified by MRN, date of birth, ID band Patient awake    Reviewed: Allergy & Precautions, NPO status , Patient's Chart, lab work & pertinent test results, reviewed documented beta blocker date and time   History of Anesthesia Complications Negative for: history of anesthetic complications  Airway Mallampati: II  TM Distance: >3 FB Neck ROM: Full    Dental  (+) Teeth Intact   Pulmonary shortness of breath,    breath sounds clear to auscultation       Cardiovascular hypertension, Pt. on medications and Pt. on home beta blockers (-) angina+ CAD  (-) Past MI and (-) CHF + Valvular Problems/Murmurs MR  Rhythm:Regular + Systolic murmurs    Neuro/Psych  Headaches, neg Seizures negative psych ROS   GI/Hepatic Neg liver ROS, GERD  Medicated and Controlled,  Endo/Other  negative endocrine ROS  Renal/GU ESRF and DialysisRenal disease     Musculoskeletal   Abdominal   Peds  Hematology  (+) anemia ,   Anesthesia Other Findings   Reproductive/Obstetrics                            Anesthesia Physical  Anesthesia Plan  ASA: III  Anesthesia Plan:    Post-op Pain Management:    Induction:   Airway Management Planned:   Additional Equipment:   Intra-op Plan:   Post-operative Plan:   Informed Consent: I have reviewed the patients History and Physical, chart, labs and discussed the procedure including the risks, benefits and alternatives for the proposed anesthesia with the patient or authorized representative who has indicated his/her understanding and acceptance.   Dental advisory given  Plan Discussed with: CRNA and Surgeon  Anesthesia Plan Comments:         Anesthesia Quick Evaluation

## 2016-03-06 LAB — CBC
HCT: 26.3 % — ABNORMAL LOW (ref 39.0–52.0)
HEMOGLOBIN: 8.7 g/dL — AB (ref 13.0–17.0)
MCH: 32.8 pg (ref 26.0–34.0)
MCHC: 33.1 g/dL (ref 30.0–36.0)
MCV: 99.2 fL (ref 78.0–100.0)
Platelets: 145 10*3/uL — ABNORMAL LOW (ref 150–400)
RBC: 2.65 MIL/uL — AB (ref 4.22–5.81)
RDW: 14.7 % (ref 11.5–15.5)
WBC: 12.1 10*3/uL — ABNORMAL HIGH (ref 4.0–10.5)

## 2016-03-06 LAB — BASIC METABOLIC PANEL
ANION GAP: 13 (ref 5–15)
BUN: 68 mg/dL — ABNORMAL HIGH (ref 6–20)
CALCIUM: 8.4 mg/dL — AB (ref 8.9–10.3)
CHLORIDE: 101 mmol/L (ref 101–111)
CO2: 19 mmol/L — AB (ref 22–32)
Creatinine, Ser: 13.92 mg/dL — ABNORMAL HIGH (ref 0.61–1.24)
GFR calc non Af Amer: 4 mL/min — ABNORMAL LOW (ref >60)
GFR, EST AFRICAN AMERICAN: 4 mL/min — AB (ref >60)
Glucose, Bld: 89 mg/dL (ref 65–99)
POTASSIUM: 5.1 mmol/L (ref 3.5–5.1)
Sodium: 133 mmol/L — ABNORMAL LOW (ref 135–145)

## 2016-03-06 NOTE — Evaluation (Signed)
Occupational Therapy Evaluation Patient Details Name: Christopher Ritter MRN: [id number] DOB: 1970/07/22 Today's Date: 03/06/2016    History of Present Illness Pt s/p R THR.  Pt is dialysis pt.   Clinical Impression   Pt is s/p THA resulting in the deficits listed below (see OT Problem List). Pt will benefit from skilled OT to increase their safety and independence with ADL and functional mobility for ADL to facilitate discharge to venue listed below.       Follow Up Recommendations  Home health OT;Supervision/Assistance - 24 hour    Equipment Recommendations  3 in 1 bedside comode       Precautions / Restrictions Precautions Precautions: Fall;Other (comment) Precaution Comments: Per pt and spouse, Dr Alvan Dame advised them "not to bend too far or twist on it" Restrictions Weight Bearing Restrictions: No Other Position/Activity Restrictions: WBAT (not in orders, found by nursing in their dc package)      Mobility Bed Mobility               General bed mobility comments: pt in chair  Transfers Overall transfer level: Needs assistance Equipment used: Rolling walker (2 wheeled) Transfers: Sit to/from Stand Sit to Stand: Min assist;From elevated surface         General transfer comment: cues for LE management and use of UEs to self assist         ADL Overall ADL's : Needs assistance/impaired                     Lower Body Dressing: Maximal assistance;With adaptive equipment;Sit to/from stand;Cueing for safety   Toilet Transfer: Minimal assistance;RW;Cueing for safety Toilet Transfer Details (indicate cue type and reason): sit to stand with urinal           General ADL Comments: Instructed pt in AE - reacher, sock aid  as well as 3 n 1 over toilet               Pertinent Vitals/Pain Pain Score: 2  Pain Location: R hip Pain Descriptors / Indicators: Aching Pain Intervention(s): Monitored during session;Repositioned;Premedicated before session         Extremity/Trunk Assessment Upper Extremity Assessment Upper Extremity Assessment: Generalized weakness           Communication Communication Communication: No difficulties   Cognition Arousal/Alertness: Awake/alert Behavior During Therapy: WFL for tasks assessed/performed Overall Cognitive Status: Within Functional Limits for tasks assessed                                Home Living Family/patient expects to be discharged to:: Private residence Living Arrangements: Spouse/significant other;Children Available Help at Discharge: Family Type of Home: House Home Access: Stairs to enter Technical brewer of Steps: 3 Entrance Stairs-Rails: None Home Layout: Two level Alternate Level Stairs-Number of Steps: 14 Alternate Level Stairs-Rails: Right           Home Equipment: Walker - 2 wheels;Bedside commode          Prior Functioning/Environment Level of Independence: Needs assistance  Gait / Transfers Assistance Needed: Utilizing RW prior to admit ADL's / Homemaking Assistance Needed: Spouse assisting as needed        OT Diagnosis: Generalized weakness;Acute pain   OT Problem List: Decreased strength;Pain;Decreased knowledge of use of DME or AE   OT Treatment/Interventions: Self-care/ADL training;DME and/or AE instruction;Patient/family education    OT Goals(Current goals can be found in the care plan  section) Acute Rehab OT Goals Patient Stated Goal: Regain IND OT Goal Formulation: With patient Time For Goal Achievement: 03/20/16 Potential to Achieve Goals: Good  OT Frequency: Min 2X/week              End of Session Nurse Communication: Mobility status  Activity Tolerance: Patient tolerated treatment well Patient left: in chair   Time: 1355-1423 OT Time Calculation (min): 28 min Charges:  OT General Charges $OT Visit: 1 Procedure OT Evaluation $OT Eval Low Complexity: 1 Procedure OT Treatments $Self Care/Home Management :  8-22 mins G-Codes:    Payton Mccallum D 11-Mar-2016, 2:40 PM

## 2016-03-06 NOTE — Progress Notes (Signed)
Patient ID: Christopher Ritter, male   DOB: 29-Aug-1970, 45 y.o.   MRN: [id number]    Subjective: 1 Day Post-Op Procedure(s) (LRB): TOTAL HIP ARTHROPLASTY ANTERIOR APPROACH (Right) Patient reports pain as 4 on 0-10 scale.   Denies CP or SOB.  Pt is dialysis pt. Positive flatus. No nausea.  Pt eating well.  Has help when he returns home.   Objective: Vital signs in last 24 hours: Temp:  [97.6 F (36.4 C)-99.1 F (37.3 C)] 98.6 F (37 C) (09/02 0639) Pulse Rate:  [59-154] 75 (09/02 0639) Resp:  [11-18] 12 (09/02 0639) BP: (70-127)/(50-90) 110/63 (09/02 0639) SpO2:  [99 %-100 %] 100 % (09/02 0639) Weight:  [64.9 kg (143 lb)] 64.9 kg (143 lb) (09/01 1807)  Intake/Output from previous day: 09/01 0701 - 09/02 0700 In: 1703.7 [P.O.:300; I.V.:1353.7; IV Piggyback:50] Out: 550 [Blood:550] Intake/Output this shift: No intake/output data recorded.  Labs:  Recent Labs  03/05/16 1330  HGB 11.4*    Recent Labs  03/05/16 1330  WBC 5.7  RBC 3.50*  HCT 34.7*  PLT 185    Recent Labs  03/05/16 1330  NA 134*  K 4.4  CL 100*  CO2 21*  BUN 48*  CREATININE 11.56*  GLUCOSE 85  CALCIUM 9.1    Recent Labs  03/05/16 1330  INR 1.04    Physical Exam: Neurologically intact ABD soft Sensation intact distally Dorsiflexion/Plantar flexion intact Incision: no drainage Compartment soft  Assessment/Plan: 1 Day Post-Op Procedure(s) (LRB): TOTAL HIP ARTHROPLASTY ANTERIOR APPROACH (Right) Up with PT May DC home after cleared by PT Pt will let us know if they recommend any home equipment Dc with home health [employer] on chart for pt upon [place] for Dr. Melina Schools Procedure [facility] [phone] 03/06/2016, 8:14 AM

## 2016-03-06 NOTE — Evaluation (Signed)
Physical Therapy Evaluation Patient Details Name: Christopher Ritter MRN: [id number] DOB: April 05, 1971 Today's Date: 03/06/2016   History of Present Illness  Pt s/p R THR.  Pt is dialysis pt.  Clinical Impression  Pt s/p R THR presents with decreased R LE strength/ROM and post op pain limiting functional mobility.  Pt should progress to dc home with family assist and HHPT follow up.    Follow Up Recommendations Home health PT    Equipment Recommendations  None recommended by PT    Recommendations for Other Services OT consult     Precautions / Restrictions Precautions Precautions: Fall;Other (comment) Precaution Comments: Per pt and spouse, Dr Alvan Dame advised them "not to bend too far or twist on it" Restrictions Weight Bearing Restrictions: No Other Position/Activity Restrictions: WBAT (not in orders, found by nursing in their dc package)      Mobility  Bed Mobility Overal bed mobility: Needs Assistance;+2 for physical assistance Bed Mobility: Supine to Sit     Supine to sit: Min assist;+2 for physical assistance     General bed mobility comments: cues for sequence and use of L LE to self assist.  Physical assist to manage R LE and to bring trunk to upright  Transfers Overall transfer level: Needs assistance Equipment used: Rolling walker (2 wheeled) Transfers: Sit to/from Stand Sit to Stand: Min assist;From elevated surface         General transfer comment: cues for LE management and use of UEs to self assist  Ambulation/Gait Ambulation/Gait assistance: Min assist;+2 safety/equipment (follow with chair) Ambulation Distance (Feet): 22 Feet Assistive device: Rolling walker (2 wheeled) Gait Pattern/deviations: Step-to pattern;Decreased step length - right;Decreased step length - left;Shuffle;Trunk flexed Gait velocity: decr Gait velocity interpretation: Below normal speed for age/gender General Gait Details: cues for sequence, posture, position from RW.  Distance ltd by  fatigue  Stairs            Wheelchair Mobility    Modified Rankin (Stroke Patients Only)       Balance                                             Pertinent Vitals/Pain Pain Assessment: 0-10 Pain Score: 3  Pain Location: R hip Pain Descriptors / Indicators: Aching;Sore Pain Intervention(s): Limited activity within patient's tolerance;Monitored during session;Premedicated before session;Ice applied    Home Living Family/patient expects to be discharged to:: Private residence Living Arrangements: Spouse/significant other;Children Available Help at Discharge: Family Type of Home: House Home Access: Stairs to enter Entrance Stairs-Rails: None Entrance Stairs-Number of Steps: 3 Home Layout: Two level Home Equipment: Environmental consultant - 2 wheels;Bedside commode      Prior Function Level of Independence: Needs assistance   Gait / Transfers Assistance Needed: Utilizing RW prior to admit  ADL's / Homemaking Assistance Needed: Spouse assisting as needed        Hand Dominance        Extremity/Trunk Assessment   Upper Extremity Assessment: Generalized weakness           Lower Extremity Assessment: Generalized weakness;RLE deficits/detail RLE Deficits / Details: Strength at hip 2+/5 with AAROM at hip to 80 flex and 15 abd    Cervical / Trunk Assessment: Normal  Communication   Communication: No difficulties  Cognition Arousal/Alertness: Awake/alert Behavior During Therapy: WFL for tasks assessed/performed Overall Cognitive Status: Within Functional Limits for tasks assessed  General Comments      Exercises Total Joint Exercises Ankle Circles/Pumps: AROM;Both;15 reps;Supine Quad Sets: AROM;Both;10 reps;Supine Heel Slides: AAROM;Right;15 reps;Supine Hip ABduction/ADduction: AAROM;Right;10 reps;Supine      Assessment/Plan    PT Assessment Patient needs continued PT services  PT Diagnosis Difficulty walking    PT Problem List Decreased strength;Decreased range of motion;Decreased activity tolerance;Decreased mobility;Decreased knowledge of use of DME;Pain  PT Treatment Interventions DME instruction;Gait training;Stair training;Functional mobility training;Therapeutic activities;Therapeutic exercise;Patient/family education   PT Goals (Current goals can be found in the Care Plan section) Acute Rehab PT Goals Patient Stated Goal: Regain IND PT Goal Formulation: With patient Time For Goal Achievement: 03/12/16 Potential to Achieve Goals: Good    Frequency 7X/week   Barriers to discharge        Co-evaluation               End of Session Equipment Utilized During Treatment: Gait belt Activity Tolerance: Patient tolerated treatment well;Patient limited by fatigue Patient left: in chair;with call bell/phone within reach;with family/visitor present Nurse Communication: Mobility status         Time: [id number] PT Time Calculation (min) (ACUTE ONLY): 39 min   Charges:   PT Evaluation $PT Eval Low Complexity: 1 Procedure PT Treatments $Gait Training: 8-22 mins $Therapeutic Exercise: 8-22 mins   PT G Codes:        Chaddrick Brue 2016/03/13, 12:10 PM

## 2016-03-06 NOTE — Progress Notes (Signed)
Physical Therapy Treatment Patient Details Name: Christopher Ritter MRN: [id number] DOB: 03/04/1971 Today's Date: 03/06/2016    History of Present Illness Pt s/p R THR.  Pt is dialysis pt.    PT Comments    Marked improvement in activity tolerance vs am session. Pt hopeful for dc home tomorrow.  Follow Up Recommendations  Home health PT     Equipment Recommendations  None recommended by PT    Recommendations for Other Services OT consult     Precautions / Restrictions Precautions Precautions: Fall;Other (comment) Precaution Comments: Per pt and spouse, Christopher Ritter advised them "not to bend too far or twist on it" Restrictions Weight Bearing Restrictions: No Other Position/Activity Restrictions: WBAT    Mobility  Bed Mobility Overal bed mobility: Needs Assistance Bed Mobility: Sit to Supine       Sit to supine: Min assist   General bed mobility comments: cues for sequence and use of L LE to self assist.  Physical assist to manage R LE  Transfers Overall transfer level: Needs assistance Equipment used: Rolling walker (2 wheeled) Transfers: Sit to/from Stand Sit to Stand: Min assist;From elevated surface         General transfer comment: cues for LE management and use of UEs to self assist  Ambulation/Gait Ambulation/Gait assistance: Min assist;Min guard Ambulation Distance (Feet): 72 Feet Assistive device: Rolling walker (2 wheeled) Gait Pattern/deviations: Step-to pattern;Step-through pattern;Shuffle;Narrow base of support;Decreased step length - right Gait velocity: decr Gait velocity interpretation: Below normal speed for age/gender General Gait Details: Min cues for sequence, posture, position from RW.     Stairs            Wheelchair Mobility    Modified Rankin (Stroke Patients Only)       Balance                                    Cognition Arousal/Alertness: Awake/alert Behavior During Therapy: WFL for tasks  assessed/performed Overall Cognitive Status: Within Functional Limits for tasks assessed                      Exercises      General Comments        Pertinent Vitals/Pain Pain Assessment: 0-10 Pain Score: 3  Pain Location: R hip Pain Descriptors / Indicators: Aching;Sore Pain Intervention(s): Limited activity within patient's tolerance;Monitored during session;Premedicated before session    [place] expects to be discharged to:: Private residence Living Arrangements: Spouse/significant other;Children Available Help at Discharge: Family Type of Home: House Home Access: Stairs to enter Entrance Stairs-Rails: None Home Layout: Two level Home Equipment: Environmental consultant - 2 wheels;Bedside commode      Prior Function Level of Independence: Needs assistance  Gait / Transfers Assistance Needed: Utilizing RW prior to admit ADL's / Homemaking Assistance Needed: Spouse assisting as needed     PT Goals (current goals can now be found in the care plan section) Acute Rehab PT Goals Patient Stated Goal: Regain IND PT Goal Formulation: With patient Time For Goal Achievement: 03/12/16 Potential to Achieve Goals: Good Progress towards PT goals: Progressing toward goals    Frequency  7X/week    PT Plan Current plan remains appropriate    Co-evaluation             End of Session Equipment Utilized During Treatment: Gait belt Activity Tolerance: Patient tolerated treatment well Patient left: in bed;with call  bell/phone within reach;with chair alarm set     Time: [phone] PT Time Calculation (min) (ACUTE ONLY): 25 min  Charges:  $Gait Training: 23-37 mins                    G Codes:      [name] 2016-03-13, 5:27 PM

## 2016-03-07 ENCOUNTER — Encounter (HOSPITAL_COMMUNITY): Payer: Self-pay | Admitting: *Deleted

## 2016-03-07 LAB — CBC
HCT: 19.5 % — ABNORMAL LOW (ref 39.0–52.0)
HCT: 22.6 % — ABNORMAL LOW (ref 39.0–52.0)
HEMOGLOBIN: 6.7 g/dL — AB (ref 13.0–17.0)
HEMOGLOBIN: 7.7 g/dL — AB (ref 13.0–17.0)
MCH: 32.7 pg (ref 26.0–34.0)
MCH: 31.7 pg (ref 26.0–34.0)
MCHC: 34.4 g/dL (ref 30.0–36.0)
MCHC: 34.1 g/dL (ref 30.0–36.0)
MCV: 95.1 fL (ref 78.0–100.0)
MCV: 93 fL (ref 78.0–100.0)
PLATELETS: 124 10*3/uL — AB (ref 150–400)
Platelets: 107 10*3/uL — ABNORMAL LOW (ref 150–400)
RBC: 2.05 MIL/uL — AB (ref 4.22–5.81)
RBC: 2.43 MIL/uL — AB (ref 4.22–5.81)
RDW: 14.5 % (ref 11.5–15.5)
RDW: 15 % (ref 11.5–15.5)
WBC: 6.7 10*3/uL (ref 4.0–10.5)
WBC: 6.8 10*3/uL (ref 4.0–10.5)

## 2016-03-07 LAB — BASIC METABOLIC PANEL
Anion gap: 12 (ref 5–15)
BUN: 84 mg/dL — AB (ref 6–20)
CHLORIDE: 102 mmol/L (ref 101–111)
CO2: 20 mmol/L — ABNORMAL LOW (ref 22–32)
Calcium: 8.3 mg/dL — ABNORMAL LOW (ref 8.9–10.3)
Creatinine, Ser: 16.16 mg/dL — ABNORMAL HIGH (ref 0.61–1.24)
GFR calc Af Amer: 4 mL/min — ABNORMAL LOW (ref >60)
GFR calc non Af Amer: 3 mL/min — ABNORMAL LOW (ref >60)
Glucose, Bld: 96 mg/dL (ref 65–99)
POTASSIUM: 5.4 mmol/L — AB (ref 3.5–5.1)
SODIUM: 134 mmol/L — AB (ref 135–145)

## 2016-03-07 LAB — PREPARE RBC (CROSSMATCH)

## 2016-03-07 LAB — HEMOGLOBIN AND HEMATOCRIT, BLOOD
HEMATOCRIT: 21.1 % — AB (ref 39.0–52.0)
HEMOGLOBIN: 7.2 g/dL — AB (ref 13.0–17.0)

## 2016-03-07 MED ORDER — ALTEPLASE 2 MG IJ SOLR
2.0000 mg | Freq: Once | INTRAMUSCULAR | Status: DC | PRN
  Filled 2016-03-07: qty 2

## 2016-03-07 MED ORDER — PENTAFLUOROPROP-TETRAFLUOROETH EX AERO
1.0000 "application " | INHALATION_SPRAY | CUTANEOUS | Status: DC | PRN
  Filled 2016-03-07: qty 30

## 2016-03-07 MED ORDER — CALCITRIOL 0.5 MCG PO CAPS
3.5000 ug | ORAL_CAPSULE | Freq: Every day | ORAL | Status: DC
  Administered 2016-03-08: 3.5 ug via ORAL
  Filled 2016-03-07 (×2): qty 7

## 2016-03-07 MED ORDER — LIDOCAINE HCL (PF) 1 % IJ SOLN
5.0000 mL | INTRAMUSCULAR | Status: DC | PRN
  Filled 2016-03-07: qty 5

## 2016-03-07 MED ORDER — LIDOCAINE-PRILOCAINE 2.5-2.5 % EX CREA: 1.0000 "application " | TOPICAL_CREAM | CUTANEOUS | Status: DC | PRN

## 2016-03-07 MED ORDER — SODIUM CHLORIDE 0.9 % IV SOLN
Freq: Once | INTRAVENOUS | Status: AC
  Administered 2016-03-07: 09:00:00 via INTRAVENOUS

## 2016-03-07 MED ORDER — CINACALCET HCL 30 MG PO TABS
90.0000 mg | ORAL_TABLET | Freq: Two times a day (BID) | ORAL | Status: DC
  Administered 2016-03-07 – 2016-03-10 (×4): 90 mg via ORAL
  Filled 2016-03-07 (×5): qty 3

## 2016-03-07 MED ORDER — SODIUM CHLORIDE 0.9 % IV SOLN: 100.0000 mL | INTRAVENOUS | Status: DC | PRN

## 2016-03-07 MED ORDER — DARBEPOETIN ALFA 60 MCG/0.3ML IJ SOSY
60.0000 ug | PREFILLED_SYRINGE | INTRAMUSCULAR | Status: DC
  Administered 2016-03-08: 60 ug via INTRAVENOUS
  Filled 2016-03-07: qty 0.3

## 2016-03-07 MED ORDER — LIP MEDEX EX OINT
TOPICAL_OINTMENT | CUTANEOUS | Status: AC
Start: 2016-03-07 — End: 2016-03-07
  Administered 2016-03-07: 21:00:00
  Filled 2016-03-07: qty 7

## 2016-03-07 NOTE — Progress Notes (Signed)
CRITICAL VALUE ALERT  Critical value received:  Hgb 6.7  Date of notification:  03/07/16  Time of notification:  0555  Critical value read back:Yes.    Nurse who received alert:  Linden Dolin, RN  MD notified (1st page):  [place], [place]   Time of first page:  [phone]  MD notified (2nd page):  Time of second page:  Responding MD:  0600  Time MD responded:  Asencion Partridge

## 2016-03-07 NOTE — Progress Notes (Signed)
Subjective: 2 Days Post-Op Procedure(s) (LRB): TOTAL HIP ARTHROPLASTY ANTERIOR APPROACH (Right) Patient reports pain as 3 on 0-10 scale.   No CP SOB not dizzy Objective: Vital signs in last 24 hours: Temp:  [98.7 F (37.1 C)-99 F (37.2 C)] 98.9 F (37.2 C) (09/03 0643) Pulse Rate:  [92-93] 92 (09/03 0643) Resp:  [14-16] 16 (09/03 0643) BP: (117-160)/(74-81) 160/81 (09/03 0643) SpO2:  [97 %-100 %] 100 % (09/03 0643)  Intake/Output from previous day: 09/02 0701 - 09/03 0700 In: 1260 [P.O.:1260] Out: 0  Intake/Output this shift: No intake/output data recorded.   Recent Labs  03/05/16 1330 03/06/16 0729 03/07/16 0524  HGB 11.4* 8.7* 6.7*    Recent Labs  03/06/16 0729 03/07/16 0524  WBC 12.1* 6.7  RBC 2.65* 2.05*  HCT 26.3* 19.5*  PLT 145* 107*    Recent Labs  03/06/16 1130 03/07/16 0524  NA 133* 134*  K 5.1 5.4*  CL 101 102  CO2 19* 20*  BUN 68* 84*  CREATININE 13.92* 16.16*  GLUCOSE 89 96  CALCIUM 8.4* 8.3*    Recent Labs  03/05/16 1330  INR 1.04    Neurologically intact Neurovascular intact Sensation intact distally Incision: dressing C/D/I Abdomen soft nontender,   Assessment/Plan: 2 Days Post-Op Procedure(s) (LRB): TOTAL HIP ARTHROPLASTY ANTERIOR APPROACH (Right) D/C IV fluids  Anemia acute blood loss classification. Hyperkalemia. Fluid retention. Cr increase. Plan Discussed with Renal Transfuse 1u PRBC. Dialysis. Will need to transfer to [facility] Consult to Renal.  Christopher Ritter C 03/07/2016, 7:59 AM  [phone]

## 2016-03-07 NOTE — Progress Notes (Signed)
PT Cancellation Note  Patient Details Name: Christopher Ritter MRN: [id number] DOB: 05-27-1971   Cancelled Treatment:     Medical HOLD pt transferring to [facility]   Nathanial Rancher 03/07/2016, 8:22 AM

## 2016-03-07 NOTE — Care Management Note (Signed)
Case Management Note  Patient Details  Name: Christopher Ritter MRN: [id number] Date of Birth: Apr 06, 1971  Subjective/Objective:  S/p right THR               Action/Plan: Discharge Planning: NCM spoke to pt at bedside. Wife at home to assist with his care. She does his hemodialysis at home. Pt states he has RW, requesting 3n1 bedside commode. Contacted [facility] for DME for home. Offered choice for [facility]. Pt agreeable to [facility].   PCP- Angelina Sheriff MD  Expected Discharge Date:                  Expected Discharge Plan:  [place]  In-House Referral:  NA  Discharge planning Services  CM Consult  Post Acute Care Choice:  Home Health Choice offered to:  Patient  DME Arranged:  3-N-1 DME Agency:  [place]:  PT [place]:  [facility] ([facility])  Status of Service:  Completed, signed off  If discussed at [place] of Stay Meetings, dates discussed:    Additional Comments:  Erenest Rasher, RN 03/07/2016, 4:39 PM

## 2016-03-07 NOTE — Consult Note (Addendum)
Reason for Consult: To manage dialysis and dialysis related needs Referring Physician: Dr. Cathlean Marseilles is an 45 y.o. male.  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  We are now seeing him in consultation for provision of dialysis and management of ESRD.    Pt underwent R hip replacement 9/1.  He tolerated the surgery well and is pleased.  Postoperatively, his hemoglobin dropped from 11.4 --> 6.7 (7.2 on repeat) and so he is currently being transfused 1 u pRBCs.  He does home hemodialysis 4x weekly.  He tolerates his treatments well.  No recent alarms, no problems with cannulation.    Dialyzes at [place] 63.5. 3 hrs 4x weekly HD Bath 1K 40 lactate, Dialyzer CAR 170 Volume 50 L per treatment Flow fraction 200% Heparin 3500 u/ treatment Access LUE AVF  Past Medical History:  Diagnosis Date  . Allergy   . Anemia    Iron deficiency  . Chronic headaches    posterior occipital headaches  . Chronic kidney disease   . Constipation   . Coronary artery disease    Mitral Valve Prolapse- per Cardiologist Dr. Bettina Gavia  . GERD (gastroesophageal reflux disease)    hx of- no meds currently  . Heart murmur    Mitral Valveprolasp  . History of Cytoxan exposure   . History of renal calculi   . Hypertension   . Parathyroid abnormality ([place])   . Shortness of breath dyspnea    shortness of breath at times related to dialysis  . Thyroid disease     Past Surgical History:  Procedure Laterality Date  . AV FISTULA PLACEMENT    . DIATEK PLACED AND REMOVED     UNTIL FISTULA MATURED  . PERIPHERAL VASCULAR CATHETERIZATION N/A 03/20/2015   Procedure: A/V Fistulagram;  Surgeon: Conrad Largo, MD;  Location: [place] [facility];  Service: Cardiovascular;  Laterality: N/A;  . REVISON OF ARTERIOVENOUS FISTULA Left 11/28/2012   Procedure: REVISON OF ARTERIOVENOUS FISTULA;  Surgeon: Elam Dutch, MD;  Location: [place];  Service:  Vascular;  Laterality: Left;  Plication   . REVISON OF ARTERIOVENOUS FISTULA Left 07/02/2015   Procedure: Pseudoaneurysm Repair of Left Upper Arm Fistula;  Surgeon: Conrad Manson, MD;  Location: [place];  Service: Vascular;  Laterality: Left;  . THROMBECTOMY W/ EMBOLECTOMY  07/08/2011   Procedure: THROMBECTOMY ARTERIOVENOUS FISTULA;  Surgeon: Mal Misty, MD;  Location: [place];  Service: Vascular;  Laterality: Left;  Incision of Ulcerated Skin and Revision of Left Upper Arm Fistula    Family History  Problem Relation Age of Onset  . Anesthesia problems Neg Hx     Social History:  reports that he has never smoked. He has never used smokeless tobacco. He reports that he does not drink alcohol or use drugs.  Allergies:  Allergies  Allergen Reactions  . Lisinopril Cough    Medications: I have reviewed the patient's current medications. Hectorol none Epogen: receives Mircera Venofer   Results for orders placed or performed during the hospital encounter of 03/05/16 (from the past 48 hour(s))  CBC WITH DIFFERENTIAL     Status: Abnormal   Collection Time: 03/05/16  1:30 PM  Result Value Ref Range   WBC 5.7 4.0 - 10.5 K/uL   RBC 3.50 (L) 4.22 - 5.81 MIL/uL   Hemoglobin 11.4 (L) 13.0 - 17.0 g/dL   HCT 34.7 (L) 39.0 -  52.0 %   MCV 99.1 78.0 - 100.0 fL   MCH 32.6 26.0 - 34.0 pg   MCHC 32.9 30.0 - 36.0 g/dL   RDW 14.7 11.5 - 15.5 %   Platelets 185 150 - 400 K/uL   Neutrophils Relative % 74 %   Neutro Abs 4.3 1.7 - 7.7 K/uL   Lymphocytes Relative 12 %   Lymphs Abs 0.7 0.7 - 4.0 K/uL   Monocytes Relative 13 %   Monocytes Absolute 0.7 0.1 - 1.0 K/uL   Eosinophils Relative 0 %   Eosinophils Absolute 0.0 0.0 - 0.7 K/uL   Basophils Relative 1 %   Basophils Absolute 0.0 0.0 - 0.1 K/uL  Comprehensive metabolic panel     Status: Abnormal   Collection Time: 03/05/16  1:30 PM  Result Value Ref Range   Sodium 134 (L) 135 - 145 mmol/L   Potassium 4.4 3.5 - 5.1 mmol/L   Chloride 100 (L) 101 -  111 mmol/L   CO2 21 (L) 22 - 32 mmol/L   Glucose, Bld 85 65 - 99 mg/dL   BUN 48 (H) 6 - 20 mg/dL   Creatinine, Ser 11.56 (H) 0.61 - 1.24 mg/dL   Calcium 9.1 8.9 - 10.3 mg/dL   Total Protein 7.8 6.5 - 8.1 g/dL   Albumin 4.4 3.5 - 5.0 g/dL   AST 12 (L) 15 - 41 U/L   ALT 11 (L) 17 - 63 U/L   Alkaline Phosphatase 54 38 - 126 U/L   Total Bilirubin 0.8 0.3 - 1.2 mg/dL   GFR calc non Af Amer 5 (L) >60 mL/min   GFR calc Af Amer 5 (L) >60 mL/min    Comment: (NOTE) The eGFR has been calculated using the CKD EPI equation. This calculation has not been validated in all clinical situations. eGFR's persistently <60 mL/min signify possible Chronic Kidney Disease.    Anion gap 13 5 - 15  Protime-INR     Status: None   Collection Time: 03/05/16  1:30 PM  Result Value Ref Range   Prothrombin Time 13.6 11.4 - 15.2 seconds   INR 1.04   Type and screen Order type and screen if day of surgery is less than 15 days from draw of preadmission visit or order morning of surgery if day of surgery is greater than 6 days from preadmission visit.     Status: None (Preliminary result)   Collection Time: 03/05/16  1:30 PM  Result Value Ref Range   ABO/RH(D) O POS    Antibody Screen NEG    Sample Expiration 03/08/2016    Unit Number [id number]    Blood Component Type RED CELLS,LR    Unit division 00    Status of Unit ISSUED    Transfusion Status OK TO TRANSFUSE    Crossmatch Result Compatible   ABO/Rh     Status: None   Collection Time: 03/05/16  1:30 PM  Result Value Ref Range   ABO/RH(D) O POS   CBC     Status: Abnormal   Collection Time: 03/06/16  7:29 AM  Result Value Ref Range   WBC 12.1 (H) 4.0 - 10.5 K/uL   RBC 2.65 (L) 4.22 - 5.81 MIL/uL   Hemoglobin 8.7 (L) 13.0 - 17.0 g/dL    Comment: RESULT REPEATED AND VERIFIED DELTA CHECK NOTED    HCT 26.3 (L) 39.0 - 52.0 %   MCV 99.2 78.0 - 100.0 fL   MCH 32.8 26.0 - 34.0 pg   MCHC 33.1  30.0 - 36.0 g/dL   RDW 14.7 11.5 - 15.5 %   Platelets 145  (L) 150 - 400 K/uL  Basic metabolic panel     Status: Abnormal   Collection Time: 03/06/16 11:30 AM  Result Value Ref Range   Sodium 133 (L) 135 - 145 mmol/L   Potassium 5.1 3.5 - 5.1 mmol/L   Chloride 101 101 - 111 mmol/L   CO2 19 (L) 22 - 32 mmol/L   Glucose, Bld 89 65 - 99 mg/dL   BUN 68 (H) 6 - 20 mg/dL   Creatinine, Ser 13.92 (H) 0.61 - 1.24 mg/dL   Calcium 8.4 (L) 8.9 - 10.3 mg/dL   GFR calc non Af Amer 4 (L) >60 mL/min   GFR calc Af Amer 4 (L) >60 mL/min    Comment: (NOTE) The eGFR has been calculated using the CKD EPI equation. This calculation has not been validated in all clinical situations. eGFR's persistently <60 mL/min signify possible Chronic Kidney Disease.    Anion gap 13 5 - 15  CBC     Status: Abnormal   Collection Time: 03/07/16  5:24 AM  Result Value Ref Range   WBC 6.7 4.0 - 10.5 K/uL   RBC 2.05 (L) 4.22 - 5.81 MIL/uL   Hemoglobin 6.7 (LL) 13.0 - 17.0 g/dL    Comment: DELTA CHECK NOTED REPEATED TO VERIFY CRITICAL RESULT CALLED TO, READ BACK BY AND VERIFIED WITH: SEAGRAVES,S RN 03/07/16 @0555  ZANDO,C    HCT 19.5 (L) 39.0 - 52.0 %   MCV 95.1 78.0 - 100.0 fL   MCH 32.7 26.0 - 34.0 pg   MCHC 34.4 30.0 - 36.0 g/dL   RDW 14.5 11.5 - 15.5 %   Platelets 107 (L) 150 - 400 K/uL    Comment: SPECIMEN CHECKED FOR CLOTS REPEATED TO VERIFY PLATELET COUNT CONFIRMED BY SMEAR   Basic metabolic panel     Status: Abnormal   Collection Time: 03/07/16  5:24 AM  Result Value Ref Range   Sodium 134 (L) 135 - 145 mmol/L   Potassium 5.4 (H) 3.5 - 5.1 mmol/L   Chloride 102 101 - 111 mmol/L   CO2 20 (L) 22 - 32 mmol/L   Glucose, Bld 96 65 - 99 mg/dL   BUN 84 (H) 6 - 20 mg/dL   Creatinine, Ser 16.16 (H) 0.61 - 1.24 mg/dL   Calcium 8.3 (L) 8.9 - 10.3 mg/dL   GFR calc non Af Amer 3 (L) >60 mL/min   GFR calc Af Amer 4 (L) >60 mL/min    Comment: (NOTE) The eGFR has been calculated using the CKD EPI equation. This calculation has not been validated in all clinical  situations. eGFR's persistently <60 mL/min signify possible Chronic Kidney Disease.    Anion gap 12 5 - 15  Hemoglobin and Hematocrit     Status: Abnormal   Collection Time: 03/07/16  8:51 AM  Result Value Ref Range   Hemoglobin 7.2 (L) 13.0 - 17.0 g/dL   HCT 21.1 (L) 39.0 - 52.0 %  Prepare RBC     Status: None   Collection Time: 03/07/16  8:51 AM  Result Value Ref Range   Order Confirmation ORDER PROCESSED BY BLOOD BANK     Dg C-arm 1-60 Min-no Report  Result Date: 03/05/2016 CLINICAL DATA:  Post RIGHT hip replacement EXAM: DG C-ARM 1-60 MIN-NO REPORT; DG HIP (WITH OR WITHOUT PELVIS) 1V PORT RIGHT COMPARISON:  None ; correlation CT abdomen and pelvis 03/05/2016 FLUOROSCOPY TIME:  0  minutes 23 seconds Images obtained:  2 Dose:  2.16 mGy FINDINGS: RIGHT hip prosthesis identified. Associated soft tissue gas and swelling. Osseous demineralization. No fracture or dislocation. Mixed lucent and sclerotic foci at the LEFT femoral head compatible with avascular necrosis again seen. IMPRESSION: RIGHT hip prosthesis without acute complication. Avascular necrosis LEFT femoral head. Electronically Signed   By: Lavonia Dana M.D.   On: 03/05/2016 18:35   Dg Hip Port Unilat With Pelvis 1v Right  Result Date: 03/05/2016 CLINICAL DATA:  Post RIGHT hip replacement EXAM: DG C-ARM 1-60 MIN-NO REPORT; DG HIP (WITH OR WITHOUT PELVIS) 1V PORT RIGHT COMPARISON:  None ; correlation CT abdomen and pelvis 03/05/2016 FLUOROSCOPY TIME:  0 minutes 23 seconds Images obtained:  2 Dose:  2.16 mGy FINDINGS: RIGHT hip prosthesis identified. Associated soft tissue gas and swelling. Osseous demineralization. No fracture or dislocation. Mixed lucent and sclerotic foci at the LEFT femoral head compatible with avascular necrosis again seen. IMPRESSION: RIGHT hip prosthesis without acute complication. Avascular necrosis LEFT femoral head. Electronically Signed   By: Lavonia Dana M.D.   On: 03/05/2016 18:35    ROS: all other systems  reviewed and are negative  Blood pressure 128/68, pulse 92, temperature 98.3 F (36.8 C), temperature source Oral, resp. rate 18, height 6' 0.25" (1.835 m), weight 66.3 kg (146 lb 2.6 oz), SpO2 100 %. GEN: well-appearing, NAD HEENT: EOMI, PERRL, MMM NECK: supple, no JVD CV : RRR soft systolic murmur at apex PULM: bibasilar coarse breath sounds ABD: soft, nontender, nondistended, NABS EXT: warm and well-perfused, no LE edema. NEURO: AAO x 3, nonfocal  Assessment/Plan: 1 s/p R hip replacement: per primary.  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  Will provide 3 hrs Rx inpatient at BFR 400 2k/ 2.5 Ca bath, NA 137, bicarb 35.  No heparin in setting of recent surgery 3 Hypertension: on home coreg and amlodipine, Bps ranging from systolics [id number] 4. 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  Last outpatient Hgb 10.6 on 8/24.  He will need adjustment in Mircera dosing.  Will give Aranesp 60 mcg with HD 9/4.  Getting 1 u PRBCs 9/3.   5. Metabolic Bone Disease: PTH chronically elevated at >1000. Adherence to sensipar spotty per last [place] rounding note.  Will give Sensipar 90 mg BID, Renvela 1600 mg TID,  with meals and Tums 3 tabs TID with meals.  Also getting calcitriol 3.5 mcg daily 6.  Mild hyperkalemia: dialysis will correct this.  Would give Kayexelate 30 g for K > 6.0.  Does not make much urine so high-dose Lasix would likely be of little benefit.   Madelon Lips 03/07/2016, 1:03 PM

## 2016-03-07 NOTE — Progress Notes (Signed)
Patient ID: Christopher Ritter, male   DOB: Sep 14, 1970, 45 y.o.   MRN: [id number]  Spoke with patient and nursing. Attempting to transfer Patient to [facility] for dialysis tomorrow as no service available today per Renal. Awaiting on [facility] Availability and whether transfering tonight or tomorrow Would be most efficient.

## 2016-03-07 NOTE — Progress Notes (Addendum)
Patient ID: Christopher Ritter, male   DOB: 13-Dec-1970, 45 y.o.   MRN: [id number] Spoke with patient and nursing. Attempting to arrange dialysis in AM As soon as possible. Need [facility] to transport patient first thing in AM if He is staying at [facility] or transfer to [facility]. Awaiting info for decision. Erasmo Score

## 2016-03-07 NOTE — Progress Notes (Signed)
OT Cancellation Note  Patient Details Name: Christopher Ritter MRN: [id number] DOB: Apr 09, 1971   Cancelled Treatment:    Reason Eval/Treat Not Completed: Medical issues which prohibited therapy -- Hgb 6.7, from chart review appears that pt is transferring to [facility] for renal consult/dialysis.  Paulene Tayag A 03/07/2016, 8:23 AM

## 2016-03-07 NOTE — Progress Notes (Signed)
Physical Therapy Treatment Patient Details Name: Christopher Ritter MRN: [id number] DOB: 1971/01/26 Today's Date: 03/07/2016    History of Present Illness Pt s/p R THR.  Pt is dialysis pt.    PT Comments    Pt currently receiving one unit of blood so performed THR TE's while in bed followed by ICE.  Demonstarted and instructed pt how to use a tied off bed sheet to self assist R LE and perform TE's to address hip mvts.   Follow Up Recommendations   Home Health     Equipment Recommendations       Recommendations for Other Services       Precautions / Restrictions   none            Exercises  R THR TE's  10 reps of all supine TE's followed by ICE Some AAROM using long bed sheet      End of Session  bed with call light in reach         Time: 1349-1408 PT Time Calculation (min) (ACUTE ONLY): 19 min  Charges:  $Therapeutic Exercise: 8-22 mins                    G Codes:      Rica Koyanagi  PTA [facility]  Acute  Rehab Pager      [phone]

## 2016-03-08 DIAGNOSIS — N2581 Secondary hyperparathyroidism of renal origin: Secondary | ICD-10-CM | POA: Diagnosis not present

## 2016-03-08 DIAGNOSIS — D509 Iron deficiency anemia, unspecified: Secondary | ICD-10-CM | POA: Diagnosis not present

## 2016-03-08 DIAGNOSIS — E440 Moderate protein-calorie malnutrition: Secondary | ICD-10-CM | POA: Diagnosis not present

## 2016-03-08 DIAGNOSIS — N186 End stage renal disease: Secondary | ICD-10-CM | POA: Diagnosis not present

## 2016-03-08 DIAGNOSIS — D630 Anemia in neoplastic disease: Secondary | ICD-10-CM | POA: Diagnosis not present

## 2016-03-08 DIAGNOSIS — D631 Anemia in chronic kidney disease: Secondary | ICD-10-CM | POA: Diagnosis not present

## 2016-03-08 LAB — TYPE AND SCREEN
ABO/RH(D): O POS
Antibody Screen: NEGATIVE
Unit division: 0

## 2016-03-08 LAB — CBC
HEMATOCRIT: 20.8 % — AB (ref 39.0–52.0)
HEMOGLOBIN: 7 g/dL — AB (ref 13.0–17.0)
MCH: 32.1 pg (ref 26.0–34.0)
MCHC: 33.7 g/dL (ref 30.0–36.0)
MCV: 95.4 fL (ref 78.0–100.0)
Platelets: 125 10*3/uL — ABNORMAL LOW (ref 150–400)
RBC: 2.18 MIL/uL — ABNORMAL LOW (ref 4.22–5.81)
RDW: 15.1 % (ref 11.5–15.5)
WBC: 6.4 10*3/uL (ref 4.0–10.5)

## 2016-03-08 LAB — RENAL FUNCTION PANEL
ALBUMIN: 2.9 g/dL — AB (ref 3.5–5.0)
ANION GAP: 15 (ref 5–15)
BUN: 92 mg/dL — ABNORMAL HIGH (ref 6–20)
CO2: 15 mmol/L — ABNORMAL LOW (ref 22–32)
Calcium: 7.2 mg/dL — ABNORMAL LOW (ref 8.9–10.3)
Chloride: 104 mmol/L (ref 101–111)
Creatinine, Ser: 18.16 mg/dL — ABNORMAL HIGH (ref 0.61–1.24)
GFR calc Af Amer: 3 mL/min — ABNORMAL LOW (ref >60)
GFR, EST NON AFRICAN AMERICAN: 3 mL/min — AB (ref >60)
GLUCOSE: 87 mg/dL (ref 65–99)
PHOSPHORUS: 9.8 mg/dL — AB (ref 2.5–4.6)
POTASSIUM: 5.5 mmol/L — AB (ref 3.5–5.1)
Sodium: 134 mmol/L — ABNORMAL LOW (ref 135–145)

## 2016-03-08 LAB — HEPATITIS B SURFACE ANTIGEN: HEP B S AG: NEGATIVE

## 2016-03-08 LAB — HEMOGLOBIN AND HEMATOCRIT, BLOOD
HCT: 23.3 % — ABNORMAL LOW (ref 39.0–52.0)
Hemoglobin: 7.6 g/dL — ABNORMAL LOW (ref 13.0–17.0)

## 2016-03-08 MED ORDER — SEVELAMER CARBONATE 800 MG PO TABS
1600.0000 mg | ORAL_TABLET | Freq: Three times a day (TID) | ORAL | Status: DC
  Administered 2016-03-08 – 2016-03-10 (×4): 1600 mg via ORAL
  Filled 2016-03-08 (×7): qty 2

## 2016-03-08 MED ORDER — DARBEPOETIN ALFA 60 MCG/0.3ML IJ SOSY
PREFILLED_SYRINGE | INTRAMUSCULAR | Status: AC
  Filled 2016-03-08: qty 0.3

## 2016-03-08 MED ORDER — DIPHENHYDRAMINE HCL 25 MG PO CAPS: 25.0000 mg | ORAL_CAPSULE | Freq: Once | ORAL | Status: DC

## 2016-03-08 MED ORDER — SODIUM CHLORIDE 0.9 % IV SOLN: Freq: Once | INTRAVENOUS | Status: DC

## 2016-03-08 MED ORDER — ACETAMINOPHEN 325 MG PO TABS
650.0000 mg | ORAL_TABLET | Freq: Once | ORAL | Status: DC
  Filled 2016-03-08: qty 2

## 2016-03-08 NOTE — Progress Notes (Signed)
Physical Therapy Treatment Patient Details Name: Christopher Ritter MRN: [id number] DOB: [birth year] Today's Date: 03/08/2016    History of Present Illness Pt s/p R THR.  Pt is dialysis pt.    PT Comments    Pt denies any lightheadedness during session. Pt mobilizing slowly but no gross loss of balance. During ambulation, encouraging pt to increase weightbearing through Rt LE in preparation for attempting stairs at next session. Anticipate D/C to home following acute stay.   Follow Up Recommendations  Home health PT;Supervision for mobility/OOB     Equipment Recommendations  None recommended by PT    Recommendations for Other Services       Precautions / Restrictions Precautions Precautions: Fall Precaution Comments: Per prior PT, Dr Alvan Dame advised them "not to bend too far or twist on it" Restrictions Weight Bearing Restrictions: Yes RLE Weight Bearing: Weight bearing as tolerated    Mobility  Bed Mobility Overal bed mobility: Needs Assistance Bed Mobility: Supine to Sit     Supine to sit: Min assist     General bed mobility comments: assist provided with Rt LE  Transfers Overall transfer level: Needs assistance Equipment used: Rolling walker (2 wheeled) Transfers: Sit to/from Stand Sit to Stand: Min guard;From elevated surface         General transfer comment: guard for safety with standing   Ambulation/Gait Ambulation/Gait assistance: Min guard Ambulation Distance (Feet): 60 Feet Assistive device: Rolling walker (2 wheeled) Gait Pattern/deviations: Step-through pattern;Decreased stance time - right;Decreased step length - right Gait velocity: decreased   General Gait Details: Encouraging weightbearing through [place].    Stairs            Wheelchair Mobility    Modified Rankin (Stroke Patients Only)       Balance Overall balance assessment: Needs assistance Sitting-balance support: Single extremity supported Sitting balance-Leahy Scale: Fair      Standing balance support: Bilateral upper extremity supported Standing balance-Leahy Scale: Poor Standing balance comment: using rw                    Cognition Arousal/Alertness: Awake/alert Behavior During Therapy: WFL for tasks assessed/performed Overall Cognitive Status: Within Functional Limits for tasks assessed                      Exercises      General Comments        Pertinent Vitals/Pain Pain Assessment: 0-10 Pain Score: 3  Pain Location: Rt hip Pain Descriptors / Indicators: Sore Pain Intervention(s): Limited activity within patient's tolerance;Monitored during session    Home Living                      Prior Function            PT Goals (current goals can now be found in the care plan section) Acute Rehab PT Goals Patient Stated Goal: be able to get home and sleep PT Goal Formulation: With patient Time For Goal Achievement: 03/12/16 Potential to Achieve Goals: Good Progress towards PT goals: Progressing toward goals    Frequency  7X/week    PT Plan Current plan remains appropriate    Co-evaluation             End of Session Equipment Utilized During Treatment: Gait belt Activity Tolerance: Patient tolerated treatment well Patient left: in bed;with call bell/phone within reach;with SCD's reapplied     Time: [id number] PT Time Calculation (min) (ACUTE ONLY): 21 min  Charges:  $Gait Training: 8-22 mins                    G Codes:      Cassell Clement, PT, CSCS Pager [phone] Office [phone]  03/08/2016, 12:54 PM

## 2016-03-08 NOTE — Progress Notes (Signed)
Discussed family concerns with RN. Patient's vitals continue to be stable despite fluctuation in Hgb. No abnormal swelling or hematoma noted in thigh or calf. No abdominal distention noted during rounds. RN reports no bloody stools. Dressing clean and dry. Patient underwent and tolerated hemodialysis this morning without complications. Will get updated H&H this afternoon. If below 7.0, will consider transfusion of two units. If 7.0 or above, will hold off as long as patient's vitals are stable. Will hold DC for continued therapy and for repeat H&H tomorrow morning.   Ardeen Jourdain, PA-C

## 2016-03-08 NOTE — Progress Notes (Signed)
Occupational Therapy Treatment Patient Details Name: Christopher Ritter MRN: [id number] DOB: 25-Sep-1970 Today's Date: 03/08/2016    History of present illness Pt s/p R THR.  Pt is dialysis pt.   OT comments  Pt making progress towards goals. Pt practiced using AE at EOB as hemoglobin was still 7.o in the afternoon. Pt with no reports of lightheaded, dizziness or nausea. Pt biggest concern as of now is getting up his flight of stairs to his bedroom. Pt plans to sponge bathe at home because 3 in 1 will not fit in shower. Pt continues to benefit from skilled OT intervention to increase safety and self-care ability.   Follow Up Recommendations  Home health OT;Supervision/Assistance - 24 hour    Equipment Recommendations  3 in 1 bedside comode    Recommendations for Other Services      Precautions / Restrictions Precautions Precautions: Fall Precaution Comments: Per prior PT, Dr Alvan Dame advised them "not to bend too far or twist on it" Restrictions Weight Bearing Restrictions: Yes RLE Weight Bearing: Weight bearing as tolerated       Mobility Bed Mobility Overal bed mobility: Needs Assistance Bed Mobility: Supine to Sit     Supine to sit: Min assist     General bed mobility comments: min assist with Rt LE  Transfers Overall transfer level: Needs assistance Equipment used: Rolling walker (2 wheeled) Transfers: Sit to/from Stand Sit to Stand: Min guard         General transfer comment: performed from elevated bed and from chair. Very guarded motion, encouraging hip flexion    Balance Overall balance assessment: Needs assistance Sitting-balance support: Bilateral upper extremity supported Sitting balance-Leahy Scale: Fair     Standing balance support: Bilateral upper extremity supported Standing balance-Leahy Scale: Poor Standing balance comment: using rw                   ADL Overall ADL's : Needs assistance/impaired     Grooming: Wash/dry hands;Wash/dry  face;Oral care;Set up;Sitting Grooming Details (indicate cue type and reason): EOB             Lower Body Dressing: Moderate assistance;With adaptive equipment;Sit to/from stand Lower Body Dressing Details (indicate cue type and reason): practiced using AE sitting EOB               General ADL Comments: Instructed pt in AE - reacher, sock aid  as well as 3 n 1 over toilet; Pt states that 3 in 1 will not fit in shower      Vision                     Perception     Praxis      Cognition   Behavior During Therapy: [facility] for tasks assessed/performed Overall Cognitive Status: Within Functional Limits for tasks assessed                       Extremity/Trunk Assessment               Exercises     Shoulder Instructions       General Comments      Pertinent Vitals/ Pain       Pain Assessment: 0-10 Pain Score: 3  Pain Location: Rt hip Pain Descriptors / Indicators: Sore Pain Intervention(s): Limited activity within patient's tolerance;Monitored during session  Home Living  Prior Functioning/Environment              Frequency Min 2X/week     Progress Toward Goals  OT Goals(current goals can now be found in the care plan section)  Progress towards OT goals: Progressing toward goals  Acute Rehab OT Goals Patient Stated Goal: be able to get home and sleep OT Goal Formulation: With patient Time For Goal Achievement: 03/20/16 Potential to Achieve Goals: Good ADL Goals Pt Will Perform Lower Body Dressing: with supervision;sit to/from stand;with adaptive equipment  Plan Discharge plan remains appropriate    Co-evaluation                 End of Session     Activity Tolerance Patient tolerated treatment well   Patient Left in bed;with call bell/phone within reach   Nurse Communication Mobility status        Time: 1430-1456 OT Time Calculation (min): 26  min  Charges: OT General Charges $OT Visit: 1 Procedure OT Treatments $Self Care/Home Management : 23-37 mins  Jaci Carrel 03/08/2016, 5:24 PM  Hulda Humphrey OTR/L [phone]

## 2016-03-08 NOTE — Progress Notes (Signed)
Subjective: 3 Days Post-Op Procedure(s) (LRB): TOTAL HIP ARTHROPLASTY ANTERIOR APPROACH (Right) Patient reports pain as mild.   Patient seen in rounds for Dr. Alvan Dame. Patient is well, and has had no acute complaints or problems other than some soreness in the right hip and thigh. No chest pain, shortness of breath, or dizziness. Patient in dialysis during encounter. Reports concern about returning home and using stairs as his bedroom is on the second floor.    Objective: Vital signs in last 24 hours: Temp:  [97 F (36.1 C)-99.8 F (37.7 C)] 97 F (36.1 C) (09/04 0700) Pulse Rate:  [77-92] 80 (09/04 0900) Resp:  [16-20] 17 (09/04 0714) BP: (124-161)/(68-84) 133/73 (09/04 0900) SpO2:  [97 %-100 %] 100 % (09/04 0700) Weight:  [65.8 kg (145 lb 1 oz)-72.5 kg (159 lb 13.3 oz)] 65.8 kg (145 lb 1 oz) (09/04 0700)  Intake/Output from previous day:  Intake/Output Summary (Last 24 hours) at 03/08/16 1024 Last data filed at 03/08/16 0523  Gross per 24 hour  Intake              705 ml  Output              150 ml  Net              555 ml     Labs:  Recent Labs  03/06/16 0729 03/07/16 0524 03/07/16 0851 03/07/16 1800 03/08/16 0715  HGB 8.7* 6.7* 7.2* 7.7* 7.0*    Recent Labs  03/07/16 1800 03/08/16 0715  WBC 6.8 6.4  RBC 2.43* 2.18*  HCT 22.6* 20.8*  PLT 124* 125*    Recent Labs  03/07/16 0524 03/08/16 0715  NA 134* 134*  K 5.4* 5.5*  CL 102 104  CO2 20* 15*  BUN 84* 92*  CREATININE 16.16* 18.16*  GLUCOSE 96 87  CALCIUM 8.3* 7.2*    Recent Labs  03/05/16 1330  INR 1.04    EXAM General - Patient is Alert and Oriented Extremity - Neurologically intact Sensation intact distally Intact pulses distally Dorsiflexion/Plantar flexion intact Compartment soft Dressing/Incision - clean, dry, no drainage Motor Function - intact, moving foot and toes well on exam.   Past Medical History:  Diagnosis Date  . Allergy   . Anemia    Iron deficiency  . Chronic  headaches    posterior occipital headaches  . Chronic kidney disease   . Constipation   . Coronary artery disease    Mitral Valve Prolapse- per Cardiologist Dr. Bettina Gavia  . GERD (gastroesophageal reflux disease)    hx of- no meds currently  . Heart murmur    Mitral Valveprolasp  . History of Cytoxan exposure   . History of renal calculi   . Hypertension   . Parathyroid abnormality ([place])   . Shortness of breath dyspnea    shortness of breath at times related to dialysis  . Thyroid disease     Assessment/Plan: 3 Days Post-Op Procedure(s) (LRB): TOTAL HIP ARTHROPLASTY ANTERIOR APPROACH (Right) Active Problems:   Status post total replacement of right hip  Estimated body mass index is 18.13 kg/m as calculated from the following:   Height as of this encounter: 6\' 3"  (1.905 m).   Weight as of this encounter: 65.8 kg (145 lb 1 oz). Advance diet Up with therapy Plan for discharge tomorrow  DVT Prophylaxis - Aspirin Weight Bearing As Tolerated   Patient has been progressing well in regards to his right THA. Transferred from [facility] last night in order  to have dialysis at [facility] today. Unlikely that he will be able to have therapy today due to his dialysis. Patient will need to stay until tomorrow in order to have therapy work with him and walking up and down stairs. Plan for DC home tomorrow.   Ardeen Jourdain, PA-C Orthopaedic Surgery 03/08/2016, 10:24 AM

## 2016-03-08 NOTE — Progress Notes (Signed)
[facility] Progress Note  Assessment/Plan: 1 S/p R hip replacement: per primary.  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  Will provide 3 hrs Rx inpatient at [facility] 400 2k/ 2.5 Ca bath, NA 137, bicarb 35.  No heparin in setting of recent surgery. Will place orders for tomorrow in case still here  3 Hypertension: on home coreg and amlodipine, Bps ranging from systolics [id number] 4. 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  Last outpatient Hgb 10.6 on 8/24.  He will need adjustment in Mircera dosing.  Will give Aranesp 60 mcg with HD 9/4.  Getting 1 u PRBCs 9/3.   5. Metabolic Bone Disease: PTH chronically elevated at >1000. Adherence to sensipar spotty per last [place] rounding note.  Will give Sensipar 90 mg BID, Renvela 1600 mg TID,  with meals and Tums 3 tabs TID with meals.  Also getting calcitriol 3.5 mcg daily 6. Mild hyperkalemia: K+ 5.5 prior to HD - dialysis will correct this.  Would give Kayexelate 30 g for K > 6.0.  Recheck tommorow AM if still here Does not make much urine so high-dose Lasix would likely be of little benefit.   Thomos Lemons Ejigiri PA-C [facility] 03/08/2016,8:53 AM  LOS: 3 days   Pt seen, examined and agree w A/P as above.  Kelly Splinter MD [facility] pager [phone]    cell [phone] 03/08/2016, 9:28 AM    Subjective:   Seen on HD UF goal 2.8L  No c/os other than feeling tired. Able to take some steps yesterday. Hoping to be discharged today.  Objective Vitals:   03/08/16 0714 03/08/16 0730 03/08/16 0800 03/08/16 0830  BP: 136/80 129/76 (!) 141/81 132/77  Pulse: 80 78 78 77  Resp: 17     Temp:      TempSrc:      SpO2:      Weight:      Height:       Physical Exam General: WNWD male NAD Heart: RRR S1 S2 Lungs: CTAB breathing unlabored Abdomen: soft NT ND Extremities: no LE edema Dialysis Access: LUE AVF cannulated   Dialysis Orders:   EDW 63.5. 3 hrs 4x weekly MTHS HD Bath 1K 40 lactate, Dialyzer CAR 170 Volume 50 L per treatment Flow fraction 200% Heparin 3500 u/ treatment Access LUE AVF  Additional Objective Labs: Basic Metabolic Panel:  Recent Labs Lab 03/06/16 1130 03/07/16 0524 03/08/16 0715  NA 133* 134* 134*  K 5.1 5.4* 5.5*  CL 101 102 104  CO2 19* 20* 15*  GLUCOSE 89 96 87  BUN 68* 84* 92*  CREATININE 13.92* 16.16* 18.16*  CALCIUM 8.4* 8.3* 7.2*  PHOS  --   --  9.8*   Liver Function Tests:  Recent Labs Lab 03/05/16 1330 03/08/16 0715  AST 12*  --   ALT 11*  --   ALKPHOS 54  --   BILITOT 0.8  --   PROT 7.8  --   ALBUMIN 4.4 2.9*   No results for input(s): LIPASE, AMYLASE in the last 168 hours. CBC:  Recent Labs Lab 03/05/16 1330 03/06/16 0729 03/07/16 0524 03/07/16 0851 03/07/16 1800 03/08/16 0715  WBC 5.7 12.1* 6.7  --  6.8 6.4  NEUTROABS 4.3  --   --   --   --   --   HGB 11.4* 8.7* 6.7* 7.2* 7.7* 7.0*  HCT 34.7* 26.3* 19.5* 21.1* 22.6* 20.8*  MCV  99.1 99.2 95.1  --  93.0 95.4  PLT 185 145* 107*  --  124* 125*   Blood Culture No results found for: SDES, SPECREQUEST, CULT, REPTSTATUS  Cardiac Enzymes: No results for input(s): CKTOTAL, CKMB, CKMBINDEX, TROPONINI in the last 168 hours. CBG: No results for input(s): GLUCAP in the last 168 hours. Iron Studies: No results for input(s): IRON, TIBC, TRANSFERRIN, FERRITIN in the last 72 hours. Lab Results  Component Value Date   INR 1.04 03/05/2016   INR 1.02 07/08/2011   INR 1.0 03/18/2009   Studies/Results: No results found. Medications: . sodium chloride 10 mL/hr at 03/05/16 2218   . amLODipine  10 mg Oral Daily  . aspirin  81 mg Oral BID  . calcitRIOL  3.5 mcg Oral Daily  . calcium carbonate  800 mg of elemental calcium Oral TID WC  . cinacalcet  90 mg Oral BID WC  . Darbepoetin Alfa      . darbepoetin (ARANESP) injection - DIALYSIS  60 mcg Intravenous Q Mon-HD  . docusate sodium  100 mg Oral BID  .  famotidine  20 mg Oral Daily  . multivitamin  1 tablet Oral Daily  . simvastatin  20 mg Oral QHS

## 2016-03-08 NOTE — Progress Notes (Signed)
Hgb 7.0 addressed. Dr Roney Jaffe did not recommend/ order blood transfusion. Dr. Jonnie Finner to talk to pt's surgeon with regards to this matter.

## 2016-03-08 NOTE — Progress Notes (Addendum)
Physical Therapy Treatment Patient Details Name: Christopher SauerBryant L Cauthon MRN: [id number] DOB: 08-Mar-1971 Today's Date: 03/08/2016    History of Present Illness Pt s/p R THR.  Pt is dialysis pt.    PT Comments    Pt guarded and slow with mobilization but able to improve activity tolerance during second PT session. Able to ambulate up/down 4 steps with min guard assist but will have to ascend 13 stairs to gain access to his bedroom. PT to continue to follow and work on mobility with an emphasis on stairs.   Follow Up Recommendations  Home health PT;Supervision for mobility/OOB     Equipment Recommendations  None recommended by PT    Recommendations for Other Services       Precautions / Restrictions Precautions Precautions: Fall Precaution Comments: Per prior PT, Dr Charlann Boxerlin advised them "not to bend too far or twist on it" Restrictions Weight Bearing Restrictions: Yes RLE Weight Bearing: Weight bearing as tolerated    Mobility  Bed Mobility Overal bed mobility: Needs Assistance Bed Mobility: Supine to Sit     Supine to sit: Min assist     General bed mobility comments: min assist with Rt LE  Transfers Overall transfer level: Needs assistance Equipment used: Rolling walker (2 wheeled) Transfers: Sit to/from Stand Sit to Stand: Min guard         General transfer comment: performed from elevated bed and from chair. Very guarded motion, encouraging hip flexion  Ambulation/Gait Ambulation/Gait assistance: Min guard;Supervision Ambulation Distance (Feet): 75 Feet Assistive device: Rolling walker (2 wheeled) Gait Pattern/deviations: Step-through pattern Gait velocity: decreased   General Gait Details: cues for even strides and hip/knee flexion   Stairs Stairs: Yes Stairs assistance: Min guard Stair Management: One rail Right;Step to pattern;Sideways Number of Stairs: 4 General stair comments: Slow and guarded pattern but no loss of balance or buckling.   Wheelchair  Mobility    Modified Rankin (Stroke Patients Only)       Balance Overall balance assessment: Needs assistance Sitting-balance support: Bilateral upper extremity supported Sitting balance-Leahy Scale: Fair     Standing balance support: Bilateral upper extremity supported Standing balance-Leahy Scale: Poor Standing balance comment: using rw                    Cognition Arousal/Alertness: Awake/alert Behavior During Therapy: WFL for tasks assessed/performed Overall Cognitive Status: Within Functional Limits for tasks assessed                      Exercises      General Comments        Pertinent Vitals/Pain Pain Assessment: 0-10 Pain Score: 3  Pain Location: Rt hip Pain Descriptors / Indicators: Sore Pain Intervention(s): Limited activity within patient's tolerance;Monitored during session    Home Living                      Prior Function            PT Goals (current goals can now be found in the care plan section) Acute Rehab PT Goals Patient Stated Goal: be able to get home and sleep PT Goal Formulation: With patient Time For Goal Achievement: 03/12/16 Potential to Achieve Goals: Good Progress towards PT goals: Progressing toward goals    Frequency  7X/week    PT Plan Current plan remains appropriate    Co-evaluation             End of Session Equipment Utilized During  Treatment: Gait belt Activity Tolerance: Patient tolerated treatment well Patient left: in chair;with call bell/phone within reach     Time: 1514-1550 PT Time Calculation (min) (ACUTE ONLY): 36 min  Charges:  $Gait Training: 23-37 mins                    G Codes:      Cassell Clement, PT, CSCS Pager [phone] Office [phone]  03/08/2016, 4:20 PM

## 2016-03-08 NOTE — Progress Notes (Signed)
Spoke with PA [employer] about pt hgb of 7.0 today. Wife had concerns about hgb dropping again after blood transfusion.     Renal MD made aware this am in HD per nurse Sal in hemo caring for pt. Informed that pt did not need another transfusion at this time, receiving aranesp today with dialysis. (see Dr Jonnie Finner notes for today).    Ortho PA Amber states surgery site stable, no bleeding noted, abdomen without symptoms, VSS, pt has no c/o. Ordered H&H at this time to reassess hgb.

## 2016-03-09 ENCOUNTER — Encounter (HOSPITAL_COMMUNITY): Payer: Self-pay | Admitting: Orthopedic Surgery

## 2016-03-09 LAB — CBC WITH DIFFERENTIAL/PLATELET
Basophils Absolute: 0 10*3/uL (ref 0.0–0.1)
Basophils Relative: 0 %
Eosinophils Absolute: 0.1 10*3/uL (ref 0.0–0.7)
Eosinophils Relative: 2 %
HCT: 22.4 % — ABNORMAL LOW (ref 39.0–52.0)
Hemoglobin: 7.2 g/dL — ABNORMAL LOW (ref 13.0–17.0)
Lymphocytes Relative: 14 %
Lymphs Abs: 0.8 10*3/uL (ref 0.7–4.0)
MCH: 31.2 pg (ref 26.0–34.0)
MCHC: 32.1 g/dL (ref 30.0–36.0)
MCV: 97 fL (ref 78.0–100.0)
Monocytes Absolute: 1 10*3/uL (ref 0.1–1.0)
Monocytes Relative: 17 %
Neutro Abs: 3.8 10*3/uL (ref 1.7–7.7)
Neutrophils Relative %: 67 %
Platelets: 148 10*3/uL — ABNORMAL LOW (ref 150–400)
RBC: 2.31 MIL/uL — ABNORMAL LOW (ref 4.22–5.81)
RDW: 14.6 % (ref 11.5–15.5)
WBC: 5.8 10*3/uL (ref 4.0–10.5)

## 2016-03-09 LAB — RENAL FUNCTION PANEL
Albumin: 3 g/dL — ABNORMAL LOW (ref 3.5–5.0)
Anion gap: 12 (ref 5–15)
BUN: 47 mg/dL — AB (ref 6–20)
CHLORIDE: 102 mmol/L (ref 101–111)
CO2: 21 mmol/L — ABNORMAL LOW (ref 22–32)
CREATININE: 11.88 mg/dL — AB (ref 0.61–1.24)
Calcium: 7.8 mg/dL — ABNORMAL LOW (ref 8.9–10.3)
GFR calc Af Amer: 5 mL/min — ABNORMAL LOW (ref >60)
GFR, EST NON AFRICAN AMERICAN: 4 mL/min — AB (ref >60)
GLUCOSE: 117 mg/dL — AB (ref 65–99)
POTASSIUM: 4.8 mmol/L (ref 3.5–5.1)
Phosphorus: 6.5 mg/dL — ABNORMAL HIGH (ref 2.5–4.6)
Sodium: 135 mmol/L (ref 135–145)

## 2016-03-09 LAB — ABO/RH: ABO/RH(D): O POS

## 2016-03-09 LAB — PREPARE RBC (CROSSMATCH)

## 2016-03-09 MED ORDER — SODIUM CHLORIDE 0.9 % IV SOLN: 100.0000 mL | INTRAVENOUS | Status: DC | PRN

## 2016-03-09 NOTE — Evaluation (Signed)
Occupational Therapy Evaluation Patient Details Name: Christopher Ritter MRN: [id number] DOB: Apr 07, 1971 Today's Date: 03/09/2016    History of Present Illness Pt s/p R THR.  Pt is dialysis pt.   Clinical Impression   Pt had transfusion in HD today. Feeling well and wanting to walk. Initially requiring min guard assist, progressed to supervision as pt mobilized. Performed standing toileting. Pt is knowledgeable in use of AE and multiple uses of 3 in 1. Educated in transporting items safely with RW.    Follow Up Recommendations  Home health OT;Supervision/Assistance - 24 hour    Equipment Recommendations  3 in 1 bedside comode    Recommendations for Other Services       Precautions / Restrictions Precautions Precautions: Fall Precaution Comments: Per prior PT, Dr Alvan Dame advised them "not to bend too far or twist on it" Restrictions Weight Bearing Restrictions: Yes RLE Weight Bearing: Weight bearing as tolerated      Mobility Bed Mobility Overal bed mobility: Needs Assistance Bed Mobility: Supine to Sit     Supine to sit: Min assist     General bed mobility comments: min assist with Rt LE  Transfers Overall transfer level: Needs assistance Equipment used: Rolling walker (2 wheeled) Transfers: Sit to/from Stand Sit to Stand: Min guard         General transfer comment: from elevated bed    Balance     Sitting balance-Leahy Scale: Good       Standing balance-Leahy Scale: Fair Standing balance comment: able to stand without support of walker for ADL                            ADL Overall ADL's : Needs assistance/impaired     Grooming: Wash/dry hands;Standing           Upper Body Dressing : Set up;Sitting   Lower Body Dressing: Minimal assistance;Sit to/from stand Lower Body Dressing Details (indicate cue type and reason): assist for R sock only Toilet Transfer: Ambulation;RW;Supervision/safety Toilet Transfer Details (indicate cue type and  reason): stood to urinate Toileting- Water quality scientist and Hygiene: Supervision/safety;Sit to/from stand       Functional mobility during ADLs: Supervision/safety;Rolling walker General ADL Comments: Pt is aware of AE. Instructed in availability of elastic shoe laces, safe footwear. Instructed in how to transport items safely with walker.     Vision     Perception     Praxis      Pertinent Vitals/Pain Pain Assessment: Faces Faces Pain Scale: Hurts little more Pain Location: R hip Pain Descriptors / Indicators: Sore Pain Intervention(s): Repositioned     Hand Dominance     Extremity/Trunk Assessment             Communication     Cognition Arousal/Alertness: Awake/alert Behavior During Therapy: WFL for tasks assessed/performed Overall Cognitive Status: Within Functional Limits for tasks assessed                     General Comments       Exercises       Shoulder Instructions      Home Living                                          Prior Functioning/Environment  OT Diagnosis:     OT Problem List:     OT Treatment/Interventions:      OT Goals(Current goals can be found in the care plan section) Acute Rehab OT Goals Patient Stated Goal: be able to get home and sleep Time For Goal Achievement: 03/20/16 Potential to Achieve Goals: Good  OT Frequency: Min 2X/week   Barriers to D/C:            Co-evaluation              End of Session Equipment Utilized During Treatment: Rolling walker  Activity Tolerance: Patient tolerated treatment well Patient left:  (walking with PT)   Time: [id number] OT Time Calculation (min): 37 min Charges:  OT General Charges $OT Visit: 1 Procedure OT Treatments $Self Care/Home Management : 23-37 mins G-Codes:    Malka So 03/09/2016, 4:24 PM  [phone]

## 2016-03-09 NOTE — Progress Notes (Signed)
PT Cancellation Note  Patient Details Name: ZIYAD STINCHCOMB MRN: [id number] DOB: Oct 14, 1970   Cancelled Treatment:    Reason Eval/Treat Not Completed: Patient at procedure or test/unavailable (Off unit for hemodialysis, will f/u when patient returns.  )   Cristela Blue 03/09/2016, 10:53 AM  Governor Rooks, PTA pager [phone]

## 2016-03-09 NOTE — Progress Notes (Signed)
OT Cancellation Note  Patient Details Name: Christopher Ritter MRN: [id number] DOB: Jun 27, 1971   Cancelled Treatment:    Reason Eval/Treat Not Completed: Patient at procedure or test/ unavailable (HD). Will follow.  Malka So 03/09/2016, 11:41 AM  [phone]

## 2016-03-09 NOTE — Progress Notes (Signed)
Physical Therapy Treatment Patient Details Name: Christopher Ritter MRN: [id number] DOB: 27-Feb-1971 Today's Date: 03/09/2016    History of Present Illness Pt s/p R THR.  Pt is dialysis pt.    PT Comments    Pt performed stairs and reviewed standing exercises.  Will review supine exercises in prep for d/c home in the am.    Follow Up Recommendations  Home health PT;Supervision for mobility/OOB     Equipment Recommendations  None recommended by PT    Recommendations for Other Services       Precautions / Restrictions Precautions Precautions: Fall Precaution Comments: Per prior PT, Dr Alvan Dame advised them "not to bend too far or twist on it" Restrictions Weight Bearing Restrictions: Yes RLE Weight Bearing: Weight bearing as tolerated Other Position/Activity Restrictions: WBAT    Mobility  Bed Mobility Overal bed mobility: Needs Assistance Bed Mobility: Supine to Sit     Supine to sit: Min assist     General bed mobility comments: Pt received in recliner on arrival.  Transfers Overall transfer level: Needs assistance Equipment used: Rolling walker (2 wheeled) Transfers: Sit to/from Stand Sit to Stand: Supervision         General transfer comment: Cues for technique.    Ambulation/Gait Ambulation/Gait assistance: Supervision Ambulation Distance (Feet): 160 Feet Assistive device: Rolling walker (2 wheeled) Gait Pattern/deviations: Step-to pattern;Trunk flexed;Decreased stride length;Antalgic Gait velocity: decreased Gait velocity interpretation: Below normal speed for age/gender General Gait Details: cues for even strides and hip/knee flexion   Stairs Stairs: Yes Stairs assistance: Supervision Stair Management: Step to pattern;Sideways Number of Stairs: 12 General stair comments: Pt performed sideways to negotiate and descend stairs, on final four stairs pt performed with cane simulated on R and rail on L.    Wheelchair Mobility    Modified Rankin (Stroke  Patients Only)       Balance Overall balance assessment: Needs assistance   Sitting balance-Leahy Scale: Good       Standing balance-Leahy Scale: Fair Standing balance comment: able to stand without support of walker for ADL                    Cognition Arousal/Alertness: Awake/alert Behavior During Therapy: WFL for tasks assessed/performed Overall Cognitive Status: Within Functional Limits for tasks assessed                      Exercises Total Joint Exercises Hip ABduction/ADduction: AROM;Right;10 reps;Standing Knee Flexion: AROM;Right;10 reps;Standing Marching in Standing: AROM;Right;10 reps;Standing Standing Hip Extension: AROM;Right;10 reps;Standing    General Comments        Pertinent Vitals/Pain Pain Assessment: Faces Faces Pain Scale: Hurts little more Pain Location: R hip Pain Descriptors / Indicators: Sore Pain Intervention(s): Repositioned    Home Living                      Prior Function            PT Goals (current goals can now be found in the care plan section) Acute Rehab PT Goals Patient Stated Goal: be able to get home and sleep Potential to Achieve Goals: Good Progress towards PT goals: Progressing toward goals    Frequency  7X/week    PT Plan Current plan remains appropriate    Co-evaluation             End of Session Equipment Utilized During Treatment: Gait belt Activity Tolerance: Patient tolerated treatment well Patient left: in chair;with call bell/phone  within reach     Time: 1557-1625 PT Time Calculation (min) (ACUTE ONLY): 28 min  Charges:  $Gait Training: 8-22 mins $Therapeutic Exercise: 8-22 mins                    G Codes:      Cristela Blue 2016-03-31, 4:44 PM Governor Rooks, PTA pager [phone]

## 2016-03-09 NOTE — Progress Notes (Signed)
[facility] Progress Note  Assessment: 1. S/p R hip replacement: per primary.  Receiving PT and OT 2. 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  No heparin in setting of recent surgery. 3. Hypertension: on home coreg and amlodipine, Bps ranging from systolics [id number] 4. Anemia of CKD and surgical blood loss - transfusing again today, gave darbe 60 on 9/4 5. 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 Adherence to sensipar spotty per last [place] rounding note.  ^'d Sensipar 90 mg BID, cont Renvela and Tum's.  Also getting calcitriol 3.5 mcg daily  Plan - short extra HD today for transfusion. Repeating Hb in am.  Next HD Thursday if still here.   Kelly Splinter MD [facility] pager [phone]    cell [phone] 03/09/2016, 10:41 AM     Subjective:   On HD, getting transfusion, no c/o  Objective Vitals:   03/09/16 0930 03/09/16 1000 03/09/16 1008 03/09/16 1023  BP: (!) 135/91 132/87 132/87 131/84  Pulse: 96 88 88 80  Resp:   18 18  Temp:   98.3 F (36.8 C) 98.4 F (36.9 C)  TempSrc:   Oral Oral  SpO2:   99% 99%  Weight:      Height:       Physical Exam General: WNWD male NAD Heart: RRR S1 S2 Lungs: CTAB breathing unlabored Abdomen: soft NT ND Extremities: no LE edema Dialysis Access: LUE AVF cannulated   Dialysis Orders: [place] EDW 63.5. 3 hrs 4x weekly [facility] HD [facility] 1K 40 lactate, Dialyzer CAR 170 Volume 50 L per treatment Flow fraction 200% Heparin 3500 u/ treatment Access LUE AVF  Additional Objective Labs: Basic Metabolic Panel:  Recent Labs Lab 03/07/16 0524 03/08/16 0715 03/09/16 0859  NA 134* 134* 135  K 5.4* 5.5* 4.8  CL 102 104 102  CO2 20* 15* 21*  GLUCOSE 96 87 117*  BUN 84* 92* 47*  CREATININE 16.16* 18.16* 11.88*  CALCIUM 8.3* 7.2* 7.8*  PHOS  --  9.8* 6.5*   Liver Function Tests:  Recent Labs Lab 03/05/16 1330 03/08/16 0715 03/09/16 0859  AST 12*  --   --   ALT 11*  --   --   ALKPHOS 54  --   --   BILITOT 0.8  --   --   PROT  7.8  --   --   ALBUMIN 4.4 2.9* 3.0*   No results for input(s): LIPASE, AMYLASE in the last 168 hours. CBC:  Recent Labs Lab 03/05/16 1330 03/06/16 0729 03/07/16 0524  03/07/16 1800 03/08/16 0715 03/08/16 1439 03/09/16 0450  WBC 5.7 12.1* 6.7  --  6.8 6.4  --  5.8  NEUTROABS 4.3  --   --   --   --   --   --  3.8  HGB 11.4* 8.7* 6.7*  < > 7.7* 7.0* 7.6* 7.2*  HCT 34.7* 26.3* 19.5*  < > 22.6* 20.8* 23.3* 22.4*  MCV 99.1 99.2 95.1  --  93.0 95.4  --  97.0  PLT 185 145* 107*  --  124* 125*  --  148*  < > = values in this interval not displayed. Blood Culture No results found for: SDES, SPECREQUEST, CULT, REPTSTATUS  Cardiac Enzymes: No results for input(s): CKTOTAL, CKMB, CKMBINDEX, TROPONINI in the last 168 hours. CBG: No results for input(s): GLUCAP in the last 168 hours. Iron Studies: No results for input(s): IRON, TIBC, TRANSFERRIN, FERRITIN in the last 72 hours. Lab Results  Component Value Date   INR 1.04  03/05/2016   INR 1.02 07/08/2011   INR 1.0 03/18/2009   Studies/Results: No results found. Medications: . sodium chloride 10 mL/hr at 03/05/16 2218   . sodium chloride   Intravenous Once  . acetaminophen  650 mg Oral Once  . amLODipine  10 mg Oral Daily  . aspirin  81 mg Oral BID  . calcitRIOL  3.5 mcg Oral Daily  . calcium carbonate  800 mg of elemental calcium Oral TID WC  . cinacalcet  90 mg Oral BID WC  . darbepoetin (ARANESP) injection - DIALYSIS  60 mcg Intravenous Q Mon-HD  . diphenhydrAMINE  25 mg Oral Once  . docusate sodium  100 mg Oral BID  . famotidine  20 mg Oral Daily  . multivitamin  1 tablet Oral Daily  . sevelamer carbonate  1,600 mg Oral TID WC  . simvastatin  20 mg Oral QHS

## 2016-03-10 LAB — TYPE AND SCREEN
ABO/RH(D): O POS
Antibody Screen: NEGATIVE
UNIT DIVISION: 0
Unit division: 0

## 2016-03-10 LAB — CBC
HCT: 31.6 % — ABNORMAL LOW (ref 39.0–52.0)
HEMOGLOBIN: 9.9 g/dL — AB (ref 13.0–17.0)
MCH: 30.2 pg (ref 26.0–34.0)
MCHC: 31.3 g/dL (ref 30.0–36.0)
MCV: 96.3 fL (ref 78.0–100.0)
PLATELETS: 187 10*3/uL (ref 150–400)
RBC: 3.28 MIL/uL — AB (ref 4.22–5.81)
RDW: 15.7 % — ABNORMAL HIGH (ref 11.5–15.5)
WBC: 7.2 10*3/uL (ref 4.0–10.5)

## 2016-03-10 NOTE — Progress Notes (Signed)
Physical Therapy Treatment Patient Details Name: Christopher Ritter MRN: [id number] DOB: 1970/08/23 Today's Date: 03/10/2016    History of Present Illness Pt s/p R THR.  Pt is dialysis pt.    PT Comments    Patient seems confident with all information about precautions, exercises, car transfers, use of ice and plan for d/c.  No further acute skilled PT needs at this time.  Follow Up Recommendations  Home health PT;Supervision for mobility/OOB     Equipment Recommendations  None recommended by PT    Recommendations for Other Services       Precautions / Restrictions Precautions Precautions: Fall Precaution Comments: Per prior PT, Dr Alvan Dame advised them "not to bend too far or twist on it" Restrictions Weight Bearing Restrictions: Yes RLE Weight Bearing: Weight bearing as tolerated    Mobility  Bed Mobility               General bed mobility comments: patient standing in room with walker upon arrival  Transfers                 General transfer comment: demonstrated car transfer and discussed with patient.  Ambulation/Gait Ambulation/Gait assistance: Modified independent (Device/Increase time) Ambulation Distance (Feet): 3 Feet Assistive device: Rolling walker (2 wheeled) Gait Pattern/deviations: Antalgic;Decreased stance time - right     General Gait Details: in room ambulating with walker on his own slow, but steady   Stairs         General stair comments: discussed the options practiced for stairs and states he feels most comfortable with sideways technique.  Denies need for further practice  Wheelchair Mobility    Modified Rankin (Stroke Patients Only)       Balance             Standing balance-Leahy Scale: Fair Standing balance comment: standing in room with walker in front, but not holding on at times                    Cognition Arousal/Alertness: Awake/alert Behavior During Therapy: WFL for tasks assessed/performed Overall  Cognitive Status: Within Functional Limits for tasks assessed                      Exercises      General Comments General comments (skin integrity, edema, etc.): Also discussed use of ice and frequency of exercise as well as compensatory techniques until strength in the R leg is better.  Patient verbalized understanding of all and feels prepared for d/c.       Pertinent Vitals/Pain Faces Pain Scale: Hurts little more Pain Location: R hip Pain Descriptors / Indicators: Sore Pain Intervention(s): Monitored during session    Home Living                      Prior Function            PT Goals (current goals can now be found in the care plan section) Progress towards PT goals: Goals met/education completed, patient discharged from PT    Frequency  7X/week    PT Plan Current plan remains appropriate    Co-evaluation             End of Session   Activity Tolerance: Patient tolerated treatment well Patient left: Other (comment) (standing in room with walker able to reach all needs as mod I. )     Time: 6659-9357 PT Time Calculation (min) (ACUTE ONLY): 9 min  Charges:  $Self Care/Home Management: 8-22                    G Codes:      Christopher Ritter April 05, 2016, 10:47 AM  Christopher Ritter, [place] 2016-04-05

## 2016-03-10 NOTE — Care Management Important Message (Signed)
Important Message  Patient Details  Name: Christopher Ritter MRN: [id number] Date of Birth: February 04, 1971   Medicare Important Message Given:  Yes    Rafel Garde Montine Circle 03/10/2016, 9:55 AM

## 2016-03-10 NOTE — Progress Notes (Signed)
   Subjective: 5 Days Post-Op Procedure(s) (LRB): TOTAL HIP ARTHROPLASTY ANTERIOR APPROACH (Right) Patient reports pain as mild.   Patient seen in rounds with Dr. Alvan Dame. Patient is well, and has had no acute complaints or problems. No issues overnight. Reports readiness to go home today.   Objective: Vital signs in last 24 hours: Temp:  [98 F (36.7 C)-100 F (37.8 C)] 99 F (37.2 C) (09/06 0701) Pulse Rate:  [79-100] 90 (09/06 0701) Resp:  [18-20] 20 (09/06 0701) BP: (130-145)/(78-91) 130/85 (09/06 0701) SpO2:  [99 %-100 %] 99 % (09/06 0701) Weight:  [63.8 kg (140 lb 10.5 oz)] 63.8 kg (140 lb 10.5 oz) (09/05 1150)  Intake/Output from previous day:  Intake/Output Summary (Last 24 hours) at 03/10/16 0848 Last data filed at 03/09/16 1700  Gross per 24 hour  Intake              781 ml  Output             1864 ml  Net            -1083 ml     Labs:  Recent Labs  03/07/16 0851 03/07/16 1800 03/08/16 0715 03/08/16 1439 03/09/16 0450  HGB 7.2* 7.7* 7.0* 7.6* 7.2*    Recent Labs  03/08/16 0715 03/08/16 1439 03/09/16 0450  WBC 6.4  --  5.8  RBC 2.18*  --  2.31*  HCT 20.8* 23.3* 22.4*  PLT 125*  --  148*    Recent Labs  03/08/16 0715 03/09/16 0859  NA 134* 135  K 5.5* 4.8  CL 104 102  CO2 15* 21*  BUN 92* 47*  CREATININE 18.16* 11.88*  GLUCOSE 87 117*  CALCIUM 7.2* 7.8*    EXAM General - Patient is Alert and Oriented Extremity - Neurologically intact Intact pulses distally Dorsiflexion/Plantar flexion intact Compartment soft Dressing/Incision - clean, dry, no drainage Motor Function - intact, moving foot and toes well on exam.   Past Medical History:  Diagnosis Date  . Allergy   . Anemia    Iron deficiency  . Chronic headaches    posterior occipital headaches  . Chronic kidney disease   . Constipation   . Coronary artery disease    Mitral Valve Prolapse- per Cardiologist Dr. Bettina Gavia  . GERD (gastroesophageal reflux disease)    hx of- no meds  currently  . Heart murmur    Mitral Valveprolasp  . History of Cytoxan exposure   . History of renal calculi   . Hypertension   . Parathyroid abnormality ([place])   . Shortness of breath dyspnea    shortness of breath at times related to dialysis  . Thyroid disease     Assessment/Plan: 5 Days Post-Op Procedure(s) (LRB): TOTAL HIP ARTHROPLASTY ANTERIOR APPROACH (Right) Active Problems:   Status post total replacement of right hip  Estimated body mass index is 17.58 kg/m as calculated from the following:   Height as of this encounter: 6\' 3"  (1.905 m).   Weight as of this encounter: 63.8 kg (140 lb 10.5 oz). Advance diet Up with therapy Discharge home with home health  DVT Prophylaxis - Aspirin Weight Bearing As Tolerated   Plan for DC home today. Follow up in office in 2 weeks with Dr. Alvan Dame for a wound check.  Christopher Jourdain, PA-C Orthopaedic Surgery 03/10/2016, 8:48 AM

## 2016-03-12 DIAGNOSIS — E440 Moderate protein-calorie malnutrition: Secondary | ICD-10-CM | POA: Diagnosis not present

## 2016-03-12 DIAGNOSIS — N186 End stage renal disease: Secondary | ICD-10-CM | POA: Diagnosis not present

## 2016-03-12 DIAGNOSIS — D631 Anemia in chronic kidney disease: Secondary | ICD-10-CM | POA: Diagnosis not present

## 2016-03-12 DIAGNOSIS — D630 Anemia in neoplastic disease: Secondary | ICD-10-CM | POA: Diagnosis not present

## 2016-03-12 DIAGNOSIS — D509 Iron deficiency anemia, unspecified: Secondary | ICD-10-CM | POA: Diagnosis not present

## 2016-03-12 DIAGNOSIS — N2581 Secondary hyperparathyroidism of renal origin: Secondary | ICD-10-CM | POA: Diagnosis not present

## 2016-03-12 NOTE — Discharge Summary (Signed)
Physician Discharge Summary   Patient ID: Christopher Ritter MRN: [id number] DOB/AGE: Mar 15, 1971 45 y.o.  Admit date: 03/05/2016 Discharge date: 03/10/2016  Primary Diagnosis:  Avascular necrosis right hip  Admission Diagnoses:  Past Medical History:  Diagnosis Date  . Allergy   . Anemia    Iron deficiency  . Chronic headaches    posterior occipital headaches  . Chronic kidney disease   . Constipation   . Coronary artery disease    Mitral Valve Prolapse- per Cardiologist Dr. Bettina Gavia  . GERD (gastroesophageal reflux disease)    hx of- no meds currently  . Heart murmur    Mitral Valveprolasp  . History of Cytoxan exposure   . History of renal calculi   . Hypertension   . Parathyroid abnormality ([place])   . Shortness of breath dyspnea    shortness of breath at times related to dialysis  . Thyroid disease    Discharge Diagnoses:   Active Problems:   Status post total replacement of right hip  Estimated body mass index is 17.58 kg/m as calculated from the following:   Height as of this encounter: _0  (1.905 m).   Weight as of this encounter: 63.8 kg (140 lb 10.5 oz).  Procedure(s) (LRB): TOTAL HIP ARTHROPLASTY ANTERIOR APPROACH (Right)   Consults: nephrology  HPI: Christopher Ritter, 45 y.o. male, has a history of pain and functional disability in the right hip(s) due to arthritis and AVN and patient has failed non-surgical conservative treatments for greater than 12 weeks to include NSAID's and/or analgesics, corticosteriod injections, use of assistive devices and activity modification.  Onset of symptoms was gradual starting 5+ years ago with gradually worsening course since that time.The patient noted no past surgery on the right hip(s).  Patient currently rates pain in the right hip at 8 out of 10 with activity. Patient has night pain, worsening of pain with activity and weight bearing, trendelenberg gait, pain that interfers with activities of daily living and pain with passive  range of motion. Patient has evidence of periarticular osteophytes, joint space narrowing and AVN by imaging studies. This condition presents safety issues increasing the risk of falls. There is no current active infection.  Risks, benefits and expectations were discussed with the patient.  Risks including but not limited to the risk of anesthesia, blood clots, nerve damage, blood vessel damage, failure of the prosthesis, infection and up to and including death.  Patient understand the risks, benefits and expectations and wishes to proceed with surgery.   Laboratory Data: Admission on 03/05/2016, Discharged on 03/10/2016  Component Date Value Ref Range Status  . WBC 03/05/2016 5.7  4.0 - 10.5 K/uL Final  . RBC 03/05/2016 3.50* 4.22 - 5.81 MIL/uL Final  . Hemoglobin 03/05/2016 11.4* 13.0 - 17.0 g/dL Final  . HCT 03/05/2016 34.7* 39.0 - 52.0 % Final  . MCV 03/05/2016 99.1  78.0 - 100.0 fL Final  . MCH 03/05/2016 32.6  26.0 - 34.0 pg Final  . MCHC 03/05/2016 32.9  30.0 - 36.0 g/dL Final  . RDW 03/05/2016 14.7  11.5 - 15.5 % Final  . Platelets 03/05/2016 185  150 - 400 K/uL Final  . Neutrophils Relative % 03/05/2016 74  % Final  . Neutro Abs 03/05/2016 4.3  1.7 - 7.7 K/uL Final  . Lymphocytes Relative 03/05/2016 12  % Final  . Lymphs Abs 03/05/2016 0.7  0.7 - 4.0 K/uL Final  . Monocytes Relative 03/05/2016 13  % Final  . Monocytes Absolute 03/05/2016  0.7  0.1 - 1.0 K/uL Final  . Eosinophils Relative 03/05/2016 0  % Final  . Eosinophils Absolute 03/05/2016 0.0  0.0 - 0.7 K/uL Final  . Basophils Relative 03/05/2016 1  % Final  . Basophils Absolute 03/05/2016 0.0  0.0 - 0.1 K/uL Final  . Sodium 03/05/2016 134* 135 - 145 mmol/L Final  . Potassium 03/05/2016 4.4  3.5 - 5.1 mmol/L Final  . Chloride 03/05/2016 100* 101 - 111 mmol/L Final  . CO2 03/05/2016 21* 22 - 32 mmol/L Final  . Glucose, Bld 03/05/2016 85  65 - 99 mg/dL Final  . BUN 03/05/2016 48* 6 - 20 mg/dL Final  . Creatinine, Ser  03/05/2016 11.56* 0.61 - 1.24 mg/dL Final  . Calcium 03/05/2016 9.1  8.9 - 10.3 mg/dL Final  . Total Protein 03/05/2016 7.8  6.5 - 8.1 g/dL Final  . Albumin 03/05/2016 4.4  3.5 - 5.0 g/dL Final  . AST 03/05/2016 12* 15 - 41 U/L Final  . ALT 03/05/2016 11* 17 - 63 U/L Final  . Alkaline Phosphatase 03/05/2016 54  38 - 126 U/L Final  . Total Bilirubin 03/05/2016 0.8  0.3 - 1.2 mg/dL Final  . GFR calc non Af Amer 03/05/2016 5* >60 mL/min Final  . GFR calc Af Amer 03/05/2016 5* >60 mL/min Final   Comment: (NOTE) The eGFR has been calculated using the CKD EPI equation. This calculation has not been validated in all clinical situations. eGFR's persistently <60 mL/min signify possible Chronic Kidney Disease.   . Anion gap 03/05/2016 13  5 - 15 Final  . Prothrombin Time 03/05/2016 13.6  11.4 - 15.2 seconds Final  . INR 03/05/2016 1.04   Final  . ABO/RH(D) 03/08/2016 O POS   Final  . Antibody Screen 03/08/2016 NEG   Final  . Sample Expiration 03/08/2016 03/08/2016   Final  . Unit Number 03/08/2016 [id number]   Final  . Blood Component Type 03/08/2016 RED CELLS,LR   Final  . Unit division 03/08/2016 00   Final  . Status of Unit 03/08/2016 ISSUED,FINAL   Final  . Transfusion Status 03/08/2016 OK TO TRANSFUSE   Final  . Crossmatch Result 03/08/2016 Compatible   Final  . ABO/RH(D) 03/05/2016 O POS   Final  . WBC 03/06/2016 12.1* 4.0 - 10.5 K/uL Final  . RBC 03/06/2016 2.65* 4.22 - 5.81 MIL/uL Final  . Hemoglobin 03/06/2016 8.7* 13.0 - 17.0 g/dL Final   Comment: RESULT REPEATED AND VERIFIED DELTA CHECK NOTED   . HCT 03/06/2016 26.3* 39.0 - 52.0 % Final  . MCV 03/06/2016 99.2  78.0 - 100.0 fL Final  . MCH 03/06/2016 32.8  26.0 - 34.0 pg Final  . MCHC 03/06/2016 33.1  30.0 - 36.0 g/dL Final  . RDW 03/06/2016 14.7  11.5 - 15.5 % Final  . Platelets 03/06/2016 145* 150 - 400 K/uL Final  . Sodium 03/06/2016 133* 135 - 145 mmol/L Final  . Potassium 03/06/2016 5.1  3.5 - 5.1 mmol/L Final  .  Chloride 03/06/2016 101  101 - 111 mmol/L Final  . CO2 03/06/2016 19* 22 - 32 mmol/L Final  . Glucose, Bld 03/06/2016 89  65 - 99 mg/dL Final  . BUN 03/06/2016 68* 6 - 20 mg/dL Final  . Creatinine, Ser 03/06/2016 13.92* 0.61 - 1.24 mg/dL Final  . Calcium 03/06/2016 8.4* 8.9 - 10.3 mg/dL Final  . GFR calc non Af Amer 03/06/2016 4* >60 mL/min Final  . GFR calc Af Amer 03/06/2016 4* >60 mL/min Final  Comment: (NOTE) The eGFR has been calculated using the CKD EPI equation. This calculation has not been validated in all clinical situations. eGFR's persistently <60 mL/min signify possible Chronic Kidney Disease.   . Anion gap 03/06/2016 13  5 - 15 Final  . WBC 03/07/2016 6.7  4.0 - 10.5 K/uL Final  . RBC 03/07/2016 2.05* 4.22 - 5.81 MIL/uL Final  . Hemoglobin 03/07/2016 6.7* 13.0 - 17.0 g/dL Final   Comment: DELTA CHECK NOTED REPEATED TO VERIFY CRITICAL RESULT CALLED TO, READ BACK BY AND VERIFIED WITH: SEAGRAVES,S RN 03/07/16 _0  ZANDO,C   . HCT 03/07/2016 19.5* 39.0 - 52.0 % Final  . MCV 03/07/2016 95.1  78.0 - 100.0 fL Final  . MCH 03/07/2016 32.7  26.0 - 34.0 pg Final  . MCHC 03/07/2016 34.4  30.0 - 36.0 g/dL Final  . RDW 03/07/2016 14.5  11.5 - 15.5 % Final  . Platelets 03/07/2016 107* 150 - 400 K/uL Final   Comment: SPECIMEN CHECKED FOR CLOTS REPEATED TO VERIFY PLATELET COUNT CONFIRMED BY SMEAR   . Sodium 03/07/2016 134* 135 - 145 mmol/L Final  . Potassium 03/07/2016 5.4* 3.5 - 5.1 mmol/L Final  . Chloride 03/07/2016 102  101 - 111 mmol/L Final  . CO2 03/07/2016 20* 22 - 32 mmol/L Final  . Glucose, Bld 03/07/2016 96  65 - 99 mg/dL Final  . BUN 03/07/2016 84* 6 - 20 mg/dL Final  . Creatinine, Ser 03/07/2016 16.16* 0.61 - 1.24 mg/dL Final  . Calcium 03/07/2016 8.3* 8.9 - 10.3 mg/dL Final  . GFR calc non Af Amer 03/07/2016 3* >60 mL/min Final  . GFR calc Af Amer 03/07/2016 4* >60 mL/min Final   Comment: (NOTE) The eGFR has been calculated using the CKD EPI equation. This  calculation has not been validated in all clinical situations. eGFR's persistently <60 mL/min signify possible Chronic Kidney Disease.   . Anion gap 03/07/2016 12  5 - 15 Final  . Hemoglobin 03/07/2016 7.2* 13.0 - 17.0 g/dL Final  . HCT 03/07/2016 21.1* 39.0 - 52.0 % Final  . Order Confirmation 03/07/2016 ORDER PROCESSED BY BLOOD BANK   Final  . WBC 03/07/2016 6.8  4.0 - 10.5 K/uL Final  . RBC 03/07/2016 2.43* 4.22 - 5.81 MIL/uL Final  . Hemoglobin 03/07/2016 7.7* 13.0 - 17.0 g/dL Final  . HCT 03/07/2016 22.6* 39.0 - 52.0 % Final  . MCV 03/07/2016 93.0  78.0 - 100.0 fL Final  . MCH 03/07/2016 31.7  26.0 - 34.0 pg Final  . MCHC 03/07/2016 34.1  30.0 - 36.0 g/dL Final  . RDW 03/07/2016 15.0  11.5 - 15.5 % Final  . Platelets 03/07/2016 124* 150 - 400 K/uL Final  . WBC 03/08/2016 6.4  4.0 - 10.5 K/uL Final  . RBC 03/08/2016 2.18* 4.22 - 5.81 MIL/uL Final  . Hemoglobin 03/08/2016 7.0* 13.0 - 17.0 g/dL Final  . HCT 03/08/2016 20.8* 39.0 - 52.0 % Final  . MCV 03/08/2016 95.4  78.0 - 100.0 fL Final  . MCH 03/08/2016 32.1  26.0 - 34.0 pg Final  . MCHC 03/08/2016 33.7  30.0 - 36.0 g/dL Final  . RDW 03/08/2016 15.1  11.5 - 15.5 % Final  . Platelets 03/08/2016 125* 150 - 400 K/uL Final  . Sodium 03/08/2016 134* 135 - 145 mmol/L Final  . Potassium 03/08/2016 5.5* 3.5 - 5.1 mmol/L Final  . Chloride 03/08/2016 104  101 - 111 mmol/L Final  . CO2 03/08/2016 15* 22 - 32 mmol/L Final  . Glucose, Bld  03/08/2016 87  65 - 99 mg/dL Final  . BUN 03/08/2016 92* 6 - 20 mg/dL Final  . Creatinine, Ser 03/08/2016 18.16* 0.61 - 1.24 mg/dL Final  . Calcium 03/08/2016 7.2* 8.9 - 10.3 mg/dL Final  . Phosphorus 03/08/2016 9.8* 2.5 - 4.6 mg/dL Final  . Albumin 03/08/2016 2.9* 3.5 - 5.0 g/dL Final  . GFR calc non Af Amer 03/08/2016 3* >60 mL/min Final  . GFR calc Af Amer 03/08/2016 3* >60 mL/min Final   Comment: (NOTE) The eGFR has been calculated using the CKD EPI equation. This calculation has not been  validated in all clinical situations. eGFR's persistently <60 mL/min signify possible Chronic Kidney Disease.   . Anion gap 03/08/2016 15  5 - 15 Final  . Hepatitis B Surface Ag 03/08/2016 Negative  Negative Final   Comment: (NOTE) Faxed 01:10 PM , called to Dawayne Patricia 01:13 PM 03/08/2016 by Doyle Askew Performed At: [facility] [place], [place] [id number] Lindon Romp MD MB:[id number]   . Hemoglobin 03/08/2016 7.6* 13.0 - 17.0 g/dL Final  . HCT 03/08/2016 23.3* 39.0 - 52.0 % Final  . ABO/RH(D) 03/10/2016 O POS   Final  . Antibody Screen 03/10/2016 NEG   Final  . Sample Expiration 03/10/2016 03/12/2016   Final  . Unit Number 03/10/2016 [id number]   Final  . Blood Component Type 03/10/2016 RBC LR PHER1   Final  . Unit division 03/10/2016 00   Final  . Status of Unit 03/10/2016 ISSUED,FINAL   Final  . Transfusion Status 03/10/2016 OK TO TRANSFUSE   Final  . Crossmatch Result 03/10/2016 Compatible   Final  . Unit Number 03/10/2016 [id number]   Final  . Blood Component Type 03/10/2016 RED CELLS,LR   Final  . Unit division 03/10/2016 00   Final  . Status of Unit 03/10/2016 ISSUED,FINAL   Final  . Transfusion Status 03/10/2016 OK TO TRANSFUSE   Final  . Crossmatch Result 03/10/2016 Compatible   Final  . Order Confirmation 03/09/2016 ORDER PROCESSED BY BLOOD BANK   Final  . WBC 03/09/2016 5.8  4.0 - 10.5 K/uL Final  . RBC 03/09/2016 2.31* 4.22 - 5.81 MIL/uL Final  . Hemoglobin 03/09/2016 7.2* 13.0 - 17.0 g/dL Final  . HCT 03/09/2016 22.4* 39.0 - 52.0 % Final  . MCV 03/09/2016 97.0  78.0 - 100.0 fL Final  . MCH 03/09/2016 31.2  26.0 - 34.0 pg Final  . MCHC 03/09/2016 32.1  30.0 - 36.0 g/dL Final  . RDW 03/09/2016 14.6  11.5 - 15.5 % Final  . Platelets 03/09/2016 148* 150 - 400 K/uL Final  . Neutrophils Relative % 03/09/2016 67  % Final  . Neutro Abs 03/09/2016 3.8  1.7 - 7.7 K/uL Final  . Lymphocytes Relative 03/09/2016 14  % Final  . Lymphs Abs  03/09/2016 0.8  0.7 - 4.0 K/uL Final  . Monocytes Relative 03/09/2016 17  % Final  . Monocytes Absolute 03/09/2016 1.0  0.1 - 1.0 K/uL Final  . Eosinophils Relative 03/09/2016 2  % Final  . Eosinophils Absolute 03/09/2016 0.1  0.0 - 0.7 K/uL Final  . Basophils Relative 03/09/2016 0  % Final  . Basophils Absolute 03/09/2016 0.0  0.0 - 0.1 K/uL Final  . ABO/RH(D) 03/09/2016 O POS   Final  . Sodium 03/09/2016 135  135 - 145 mmol/L Final  . Potassium 03/09/2016 4.8  3.5 - 5.1 mmol/L Final  . Chloride 03/09/2016 102  101 - 111 mmol/L Final  . CO2  03/09/2016 21* 22 - 32 mmol/L Final  . Glucose, Bld 03/09/2016 117* 65 - 99 mg/dL Final  . BUN 03/09/2016 47* 6 - 20 mg/dL Final  . Creatinine, Ser 03/09/2016 11.88* 0.61 - 1.24 mg/dL Final  . Calcium 03/09/2016 7.8* 8.9 - 10.3 mg/dL Final  . Phosphorus 03/09/2016 6.5* 2.5 - 4.6 mg/dL Final  . Albumin 03/09/2016 3.0* 3.5 - 5.0 g/dL Final  . GFR calc non Af Amer 03/09/2016 4* >60 mL/min Final  . GFR calc Af Amer 03/09/2016 5* >60 mL/min Final   Comment: (NOTE) The eGFR has been calculated using the CKD EPI equation. This calculation has not been validated in all clinical situations. eGFR's persistently <60 mL/min signify possible Chronic Kidney Disease.   . Anion gap 03/09/2016 12  5 - 15 Final  . WBC 03/10/2016 7.2  4.0 - 10.5 K/uL Final  . RBC 03/10/2016 3.28* 4.22 - 5.81 MIL/uL Final  . Hemoglobin 03/10/2016 9.9* 13.0 - 17.0 g/dL Final  . HCT 03/10/2016 31.6* 39.0 - 52.0 % Final  . MCV 03/10/2016 96.3  78.0 - 100.0 fL Final  . MCH 03/10/2016 30.2  26.0 - 34.0 pg Final  . MCHC 03/10/2016 31.3  30.0 - 36.0 g/dL Final  . RDW 03/10/2016 15.7* 11.5 - 15.5 % Final  . Platelets 03/10/2016 187  150 - 400 K/uL Final  Hospital Outpatient Visit on 02/27/2016  Component Date Value Ref Range Status  . MRSA, PCR 02/27/2016 NEGATIVE  NEGATIVE Final  . Staphylococcus aureus 02/27/2016 NEGATIVE  NEGATIVE Final   Comment:        The Xpert SA Assay  (FDA approved for NASAL specimens in patients over 59 years of age), is one component of a comprehensive surveillance program.  Test performance has been validated by [facility] for patients greater than or equal to 64 year old. It is not intended to diagnose infection nor to guide or monitor treatment.   . Color, Urine 02/27/2016 YELLOW  YELLOW Final  . APPearance 02/27/2016 CLEAR  CLEAR Final  . Specific Gravity, Urine 02/27/2016 1.014  1.005 - 1.030 Final  . pH 02/27/2016 6.0  5.0 - 8.0 Final  . Glucose, UA 02/27/2016 NEGATIVE  NEGATIVE mg/dL Final  . Hgb urine dipstick 02/27/2016 TRACE* NEGATIVE Final  . Bilirubin Urine 02/27/2016 NEGATIVE  NEGATIVE Final  . Ketones, ur 02/27/2016 NEGATIVE  NEGATIVE mg/dL Final  . Protein, ur 02/27/2016 30* NEGATIVE mg/dL Final  . Nitrite 02/27/2016 NEGATIVE  NEGATIVE Final  . Leukocytes, UA 02/27/2016 NEGATIVE  NEGATIVE Final  . Squamous Epithelial / LPF 02/27/2016 0-5* NONE SEEN Final  . WBC, UA 02/27/2016 0-5  0 - 5 WBC/hpf Final  . RBC / HPF 02/27/2016 0-5  0 - 5 RBC/hpf Final  . Bacteria, UA 02/27/2016 RARE* NONE SEEN Final     X-Rays:Dg C-arm 1-60 Min-no Report  Result Date: 03/05/2016 CLINICAL DATA:  Post RIGHT hip replacement EXAM: DG C-ARM 1-60 MIN-NO REPORT; DG HIP (WITH OR WITHOUT PELVIS) 1V PORT RIGHT COMPARISON:  None ; correlation CT abdomen and pelvis 03/05/2016 FLUOROSCOPY TIME:  0 minutes 23 seconds Images obtained:  2 Dose:  2.16 mGy FINDINGS: RIGHT hip prosthesis identified. Associated soft tissue gas and swelling. Osseous demineralization. No fracture or dislocation. Mixed lucent and sclerotic foci at the LEFT femoral head compatible with avascular necrosis again seen. IMPRESSION: RIGHT hip prosthesis without acute complication. Avascular necrosis LEFT femoral head. Electronically Signed   By: Lavonia Dana M.D.   On: 03/05/2016 18:35  Dg Hip Port Unilat With Pelvis 1v Right  Result Date: 03/05/2016 CLINICAL DATA:  Post RIGHT  hip replacement EXAM: DG C-ARM 1-60 MIN-NO REPORT; DG HIP (WITH OR WITHOUT PELVIS) 1V PORT RIGHT COMPARISON:  None ; correlation CT abdomen and pelvis 03/05/2016 FLUOROSCOPY TIME:  0 minutes 23 seconds Images obtained:  2 Dose:  2.16 mGy FINDINGS: RIGHT hip prosthesis identified. Associated soft tissue gas and swelling. Osseous demineralization. No fracture or dislocation. Mixed lucent and sclerotic foci at the LEFT femoral head compatible with avascular necrosis again seen. IMPRESSION: RIGHT hip prosthesis without acute complication. Avascular necrosis LEFT femoral head. Electronically Signed   By: Lavonia Dana M.D.   On: 03/05/2016 18:35    EKG: Orders placed or performed during the hospital encounter of 03/20/15  . EKG 12-Lead  . EKG 12-Lead  . EKG 12-Lead  . EKG 12-Lead     Hospital Course: Patient was admitted to [facility] and taken to the OR and underwent the above state procedure without complications.  Patient tolerated the procedure well and was later transferred to the recovery room and then to the orthopaedic floor for postoperative care.  They were given PO and IV analgesics for pain control following their surgery.  They were given 24 hours of postoperative antibiotics of  Anti-infectives    Start     Dose/Rate Route Frequency Ordered Stop   03/06/16 0600  ceFAZolin (ANCEF) IVPB 2g/100 mL premix     2 g 200 mL/hr over 30 Minutes Intravenous On call to O.R. 03/05/16 1324 03/05/16 1503   03/06/16 0300  ceFAZolin (ANCEF) IVPB 1 g/50 mL premix     1 g 100 mL/hr over 30 Minutes Intravenous Every 12 hours 03/05/16 1806 03/06/16 1606     and started on DVT prophylaxis in the form of Aspirin.   PT and OT were ordered for total hip protocol.  The patient was allowed to be WBAT with therapy. Discharge planning was consulted to help with postop disposition and equipment needs.  Patient had a fair night on the evening of surgery.  They started to get up OOB with therapy on day one.   Continued to work with therapy into day two. Patient required transfer to [facility] for dialysis, which he underwent post op day three without difficulty.   The patient had progressed with therapy and meeting their goals by post op day five.  Incision was healing well.  Patient was seen in rounds and was ready to go home.   Diet: Cardiac diet and Renal diet Activity:WBAT Follow-up:in 2 weeks Disposition - Home Discharged Condition: stable   Discharge Instructions    Call MD / Call 911    Complete by:  As directed   If you experience chest pain or shortness of breath, CALL 911 and be transported to the hospital emergency room.  If you develope a fever above 101 F, pus (white drainage) or increased drainage or redness at the wound, or calf pain, call your surgeon's office.   Constipation Prevention    Complete by:  As directed   Drink plenty of fluids.  Prune juice may be helpful.  You may use a stool softener, such as Colace (over the counter) 100 mg twice a day.  Use MiraLax (over the counter) for constipation as needed.   Diet - low sodium heart healthy    Complete by:  As directed   Discharge instructions    Complete by:  As directed   INSTRUCTIONS AFTER  JOINT REPLACEMENT   Remove items at home which could result in a fall. This includes throw rugs or furniture in walking pathways ICE to the affected joint every three hours while awake for 30 minutes at a time, for at least the first 3-5 days, and then as needed for pain and swelling.  Continue to use ice for pain and swelling. You may notice swelling that will progress down to the foot and ankle.  This is normal after surgery.  Elevate your leg when you are not up walking on it.   Continue to use the breathing machine you got in the hospital (incentive spirometer) which will help keep your temperature down.  It is common for your temperature to cycle up and down following surgery, especially at night when you are not up moving around and  exerting yourself.  The breathing machine keeps your lungs expanded and your temperature down.   DIET:  As you were doing prior to hospitalization, we recommend a well-balanced diet.  DRESSING / WOUND CARE / SHOWERING  Keep the surgical dressing until follow up.  The dressing is water proof, so you can shower without any extra covering.  IF THE DRESSING FALLS OFF or the wound gets wet inside, change the dressing with sterile gauze.  Please use good hand washing techniques before changing the dressing.  Do not use any lotions or creams on the incision until instructed by your surgeon.    ACTIVITY  Increase activity slowly as tolerated, but follow the weight bearing instructions below.   No driving for 6 weeks or until further direction given by your physician.  You cannot drive while taking narcotics.  No lifting or carrying greater than 10 lbs. until further directed by your surgeon. Avoid periods of inactivity such as sitting longer than an hour when not asleep. This helps prevent blood clots.  You may return to work once you are authorized by your doctor.     WEIGHT BEARING   Weight bearing as tolerated with assist device (walker, cane, etc) as directed, use it as long as suggested by your surgeon or therapist, typically at least 4-6 weeks.   EXERCISES  Results after joint replacement surgery are often greatly improved when you follow the exercise, range of motion and muscle strengthening exercises prescribed by your doctor. Safety measures are also important to protect the joint from further injury. Any time any of these exercises cause you to have increased pain or swelling, decrease what you are doing until you are comfortable again and then slowly increase them. If you have problems or questions, call your caregiver or physical therapist for advice.   Rehabilitation is important following a joint replacement. After just a few days of immobilization, the muscles of the leg can become  weakened and shrink (atrophy).  These exercises are designed to build up the tone and strength of the thigh and leg muscles and to improve motion. Often times heat used for twenty to thirty minutes before working out will loosen up your tissues and help with improving the range of motion but do not use heat for the first two weeks following surgery (sometimes heat can increase post-operative swelling).   These exercises can be done on a training (exercise) mat, on the floor, on a table or on a bed. Use whatever works the best and is most comfortable for you.    Use music or television while you are exercising so that the exercises are a pleasant break in your day.  This will make your life better with the exercises acting as a break in your routine that you can look forward to.   Perform all exercises about fifteen times, three times per day or as directed.  You should exercise both the operative leg and the other leg as well.   Exercises include:   Quad Sets - Tighten up the muscle on the front of the thigh (Quad) and hold for 5-10 seconds.   Straight Leg Raises - With your knee straight (if you were given a brace, keep it on), lift the leg to 60 degrees, hold for 3 seconds, and slowly lower the leg.  Perform this exercise against resistance later as your leg gets stronger.  Leg Slides: Lying on your back, slowly slide your foot toward your buttocks, bending your knee up off the floor (only go as far as is comfortable). Then slowly slide your foot back down until your leg is flat on the floor again.  Angel Wings: Lying on your back spread your legs to the side as far apart as you can without causing discomfort.  Hamstring Strength:  Lying on your back, push your heel against the floor with your leg straight by tightening up the muscles of your buttocks.  Repeat, but this time bend your knee to a comfortable angle, and push your heel against the floor.  You may put a pillow under the heel to make it more  comfortable if necessary.   A rehabilitation program following joint replacement surgery can speed recovery and prevent re-injury in the future due to weakened muscles. Contact your doctor or a physical therapist for more information on knee rehabilitation.    CONSTIPATION  Constipation is defined medically as fewer than three stools per week and severe constipation as less than one stool per week.  Even if you have a regular bowel pattern at home, your normal regimen is likely to be disrupted due to multiple reasons following surgery.  Combination of anesthesia, postoperative narcotics, change in appetite and fluid intake all can affect your bowels.   YOU MUST use at least one of the following options; they are listed in order of increasing strength to get the job done.  They are all available over the counter, and you may need to use some, POSSIBLY even all of these options:    Drink plenty of fluids (prune juice may be helpful) and high fiber foods Colace 100 mg by mouth twice a day  Senokot for constipation as directed and as needed Dulcolax (bisacodyl), take with full glass of water  Miralax (polyethylene glycol) once or twice a day as needed.  If you have tried all these things and are unable to have a bowel movement in the first 3-4 days after surgery call either your surgeon or your primary doctor.    If you experience loose stools or diarrhea, hold the medications until you stool forms back up.  If your symptoms do not get better within 1 week or if they get worse, check with your doctor.  If you experience "the worst abdominal pain ever" or develop nausea or vomiting, please contact the office immediately for further recommendations for treatment.   ITCHING:  If you experience itching with your medications, try taking only a single pain pill, or even half a pain pill at a time.  You can also use Benadryl over the counter for itching or also to help with sleep.   TED HOSE STOCKINGS:   Use stockings on both  legs until for at least 2 weeks or as directed by physician office. They may be removed at night for sleeping.  MEDICATIONS:  See your medication summary on the "After Visit Summary" that nursing will review with you.  You may have some home medications which will be placed on hold until you complete the course of blood thinner medication.  It is important for you to complete the blood thinner medication as prescribed.  PRECAUTIONS:  If you experience chest pain or shortness of breath - call 911 immediately for transfer to the hospital emergency department.   If you develop a fever greater that 101 F, purulent drainage from wound, increased redness or drainage from wound, foul odor from the wound/dressing, or calf pain - CONTACT YOUR SURGEON.                                                   FOLLOW-UP APPOINTMENTS:  If you do not already have a post-op appointment, please call the office for an appointment to be seen by your surgeon.  Guidelines for how soon to be seen are listed in your "After Visit Summary", but are typically between 1-4 weeks after surgery.  MAKE SURE YOU:  Understand these instructions.  Get help right away if you are not doing well or get worse.    Thank you for letting us be a part of your medical care team.  It is a privilege we respect greatly.  We hope these instructions will help you stay on track for a fast and full recovery!   Increase activity slowly as tolerated    Complete by:  As directed   Weight bearing as tolerated    Complete by:  As directed       Medication List    TAKE these medications   amLODipine 10 MG tablet Commonly known as:  NORVASC Take 10 mg by mouth daily.   aspirin 81 MG chewable tablet Commonly known as:  ASPIRIN CHILDRENS Chew 1 tablet (81 mg total) by mouth 2 (two) times daily.   calcitRIOL 0.25 MCG capsule Commonly known as:  ROCALTROL Take 0.25 mcg by mouth daily.   calcium carbonate 750 MG chewable  tablet Commonly known as:  TUMS EX Chew 2-3 tablets by mouth 3 (three) times daily with meals. Chew 3 tablets by mouth 3 times daily with meals and chew 2 tablets by mouth with snacks.   carvedilol 6.25 MG tablet Commonly known as:  COREG Take 6.25 mg by mouth as needed (Only take when systolic is 563 or higher).   ethyl chloride spray Apply 1 application topically every other day.   HYDROcodone-acetaminophen 7.5-325 MG tablet Commonly known as:  NORCO Take 1 tablet by mouth every 6 (six) hours as needed for moderate pain. What changed:  Another medication with the same name was added. Make sure you understand how and when to take each.   HYDROcodone-acetaminophen 10-325 MG tablet Commonly known as:  NORCO Take 1 tablet by mouth every 4 (four) hours as needed for moderate pain. What changed:  Another medication with the same name was added. Make sure you understand how and when to take each.   HYDROcodone-acetaminophen 7.5-325 MG tablet Commonly known as:  NORCO Take 1-2 tablets by mouth every 4 (four) hours as needed (breakthrough pain). What changed:  You were already taking a  medication with the same name, and this prescription was added. Make sure you understand how and when to take each.   loratadine 10 MG tablet Commonly known as:  CLARITIN Take 10 mg by mouth daily as needed for allergies.   methocarbamol 500 MG tablet Commonly known as:  ROBAXIN Take 1 tablet (500 mg total) by mouth every 6 (six) hours as needed for muscle spasms.   multivitamin Tabs tablet Take 1 tablet by mouth daily.   PROAIR HFA 108 (90 Base) MCG/ACT inhaler Generic drug:  albuterol Inhale 2 puffs by mouth 4 times daily as needed for shortness of breath   promethazine 25 MG tablet Commonly known as:  PHENERGAN Take 25 mg by mouth every 8 (eight) hours as needed for nausea or vomiting.   ranitidine 75 MG tablet Commonly known as:  ZANTAC Take 75 mg by mouth as needed for heartburn.     SENSIPAR 90 MG tablet Generic drug:  cinacalcet Take 90 mg by mouth daily.   simvastatin 20 MG tablet Commonly known as:  ZOCOR Take 20 mg by mouth at bedtime.   STOOL SOFTENER 100 MG capsule Generic drug:  docusate sodium Take 100 mg by mouth daily as needed for mild constipation.      Follow-up Information    Mauri Pole, MD Follow up in 2 week(s).   Specialty:  Orthopedic Surgery Why:  For wound check Contact information: [address] [place] [address] [phone]        [facility] .   Why:  Home Health Physical Therapy Contact information: [place] [address] [phone]           Signed: Ardeen Jourdain, PA-C Orthopaedic Surgery 03/12/2016, 1:42 PM

## 2016-03-14 DIAGNOSIS — D631 Anemia in chronic kidney disease: Secondary | ICD-10-CM | POA: Diagnosis not present

## 2016-03-14 DIAGNOSIS — D630 Anemia in neoplastic disease: Secondary | ICD-10-CM | POA: Diagnosis not present

## 2016-03-14 DIAGNOSIS — N186 End stage renal disease: Secondary | ICD-10-CM | POA: Diagnosis not present

## 2016-03-14 DIAGNOSIS — E440 Moderate protein-calorie malnutrition: Secondary | ICD-10-CM | POA: Diagnosis not present

## 2016-03-14 DIAGNOSIS — D509 Iron deficiency anemia, unspecified: Secondary | ICD-10-CM | POA: Diagnosis not present

## 2016-03-14 DIAGNOSIS — N2581 Secondary hyperparathyroidism of renal origin: Secondary | ICD-10-CM | POA: Diagnosis not present

## 2016-03-16 DIAGNOSIS — M87052 Idiopathic aseptic necrosis of left femur: Secondary | ICD-10-CM | POA: Diagnosis not present

## 2016-03-16 DIAGNOSIS — N186 End stage renal disease: Secondary | ICD-10-CM | POA: Diagnosis not present

## 2016-03-16 DIAGNOSIS — Z96641 Presence of right artificial hip joint: Secondary | ICD-10-CM | POA: Diagnosis not present

## 2016-03-16 DIAGNOSIS — I2510 Atherosclerotic heart disease of native coronary artery without angina pectoris: Secondary | ICD-10-CM | POA: Diagnosis not present

## 2016-03-16 DIAGNOSIS — I120 Hypertensive chronic kidney disease with stage 5 chronic kidney disease or end stage renal disease: Secondary | ICD-10-CM | POA: Diagnosis not present

## 2016-03-16 DIAGNOSIS — D631 Anemia in chronic kidney disease: Secondary | ICD-10-CM | POA: Diagnosis not present

## 2016-03-16 DIAGNOSIS — D630 Anemia in neoplastic disease: Secondary | ICD-10-CM | POA: Diagnosis not present

## 2016-03-16 DIAGNOSIS — E440 Moderate protein-calorie malnutrition: Secondary | ICD-10-CM | POA: Diagnosis not present

## 2016-03-16 DIAGNOSIS — D509 Iron deficiency anemia, unspecified: Secondary | ICD-10-CM | POA: Diagnosis not present

## 2016-03-16 DIAGNOSIS — Z471 Aftercare following joint replacement surgery: Secondary | ICD-10-CM | POA: Diagnosis not present

## 2016-03-16 DIAGNOSIS — N2581 Secondary hyperparathyroidism of renal origin: Secondary | ICD-10-CM | POA: Diagnosis not present

## 2016-03-18 DIAGNOSIS — D631 Anemia in chronic kidney disease: Secondary | ICD-10-CM | POA: Diagnosis not present

## 2016-03-18 DIAGNOSIS — D630 Anemia in neoplastic disease: Secondary | ICD-10-CM | POA: Diagnosis not present

## 2016-03-18 DIAGNOSIS — N2581 Secondary hyperparathyroidism of renal origin: Secondary | ICD-10-CM | POA: Diagnosis not present

## 2016-03-18 DIAGNOSIS — D509 Iron deficiency anemia, unspecified: Secondary | ICD-10-CM | POA: Diagnosis not present

## 2016-03-18 DIAGNOSIS — I2510 Atherosclerotic heart disease of native coronary artery without angina pectoris: Secondary | ICD-10-CM | POA: Diagnosis not present

## 2016-03-18 DIAGNOSIS — Z96641 Presence of right artificial hip joint: Secondary | ICD-10-CM | POA: Diagnosis not present

## 2016-03-18 DIAGNOSIS — N186 End stage renal disease: Secondary | ICD-10-CM | POA: Diagnosis not present

## 2016-03-18 DIAGNOSIS — E440 Moderate protein-calorie malnutrition: Secondary | ICD-10-CM | POA: Diagnosis not present

## 2016-03-18 DIAGNOSIS — I120 Hypertensive chronic kidney disease with stage 5 chronic kidney disease or end stage renal disease: Secondary | ICD-10-CM | POA: Diagnosis not present

## 2016-03-18 DIAGNOSIS — Z471 Aftercare following joint replacement surgery: Secondary | ICD-10-CM | POA: Diagnosis not present

## 2016-03-18 DIAGNOSIS — M87052 Idiopathic aseptic necrosis of left femur: Secondary | ICD-10-CM | POA: Diagnosis not present

## 2016-03-19 DIAGNOSIS — Z471 Aftercare following joint replacement surgery: Secondary | ICD-10-CM | POA: Diagnosis not present

## 2016-03-19 DIAGNOSIS — Z96641 Presence of right artificial hip joint: Secondary | ICD-10-CM | POA: Diagnosis not present

## 2016-03-20 DIAGNOSIS — N2581 Secondary hyperparathyroidism of renal origin: Secondary | ICD-10-CM | POA: Diagnosis not present

## 2016-03-20 DIAGNOSIS — E440 Moderate protein-calorie malnutrition: Secondary | ICD-10-CM | POA: Diagnosis not present

## 2016-03-20 DIAGNOSIS — D509 Iron deficiency anemia, unspecified: Secondary | ICD-10-CM | POA: Diagnosis not present

## 2016-03-20 DIAGNOSIS — D630 Anemia in neoplastic disease: Secondary | ICD-10-CM | POA: Diagnosis not present

## 2016-03-20 DIAGNOSIS — D631 Anemia in chronic kidney disease: Secondary | ICD-10-CM | POA: Diagnosis not present

## 2016-03-20 DIAGNOSIS — N186 End stage renal disease: Secondary | ICD-10-CM | POA: Diagnosis not present

## 2016-03-22 DIAGNOSIS — N2581 Secondary hyperparathyroidism of renal origin: Secondary | ICD-10-CM | POA: Diagnosis not present

## 2016-03-22 DIAGNOSIS — D509 Iron deficiency anemia, unspecified: Secondary | ICD-10-CM | POA: Diagnosis not present

## 2016-03-22 DIAGNOSIS — Z471 Aftercare following joint replacement surgery: Secondary | ICD-10-CM | POA: Diagnosis not present

## 2016-03-22 DIAGNOSIS — Z96641 Presence of right artificial hip joint: Secondary | ICD-10-CM | POA: Diagnosis not present

## 2016-03-22 DIAGNOSIS — M87052 Idiopathic aseptic necrosis of left femur: Secondary | ICD-10-CM | POA: Diagnosis not present

## 2016-03-22 DIAGNOSIS — D630 Anemia in neoplastic disease: Secondary | ICD-10-CM | POA: Diagnosis not present

## 2016-03-22 DIAGNOSIS — D631 Anemia in chronic kidney disease: Secondary | ICD-10-CM | POA: Diagnosis not present

## 2016-03-22 DIAGNOSIS — E440 Moderate protein-calorie malnutrition: Secondary | ICD-10-CM | POA: Diagnosis not present

## 2016-03-22 DIAGNOSIS — N186 End stage renal disease: Secondary | ICD-10-CM | POA: Diagnosis not present

## 2016-03-22 DIAGNOSIS — I120 Hypertensive chronic kidney disease with stage 5 chronic kidney disease or end stage renal disease: Secondary | ICD-10-CM | POA: Diagnosis not present

## 2016-03-22 DIAGNOSIS — I2510 Atherosclerotic heart disease of native coronary artery without angina pectoris: Secondary | ICD-10-CM | POA: Diagnosis not present

## 2016-03-23 DIAGNOSIS — D631 Anemia in chronic kidney disease: Secondary | ICD-10-CM | POA: Diagnosis not present

## 2016-03-23 DIAGNOSIS — D509 Iron deficiency anemia, unspecified: Secondary | ICD-10-CM | POA: Diagnosis not present

## 2016-03-23 DIAGNOSIS — D630 Anemia in neoplastic disease: Secondary | ICD-10-CM | POA: Diagnosis not present

## 2016-03-23 DIAGNOSIS — I120 Hypertensive chronic kidney disease with stage 5 chronic kidney disease or end stage renal disease: Secondary | ICD-10-CM | POA: Diagnosis not present

## 2016-03-23 DIAGNOSIS — N186 End stage renal disease: Secondary | ICD-10-CM | POA: Diagnosis not present

## 2016-03-23 DIAGNOSIS — Z96641 Presence of right artificial hip joint: Secondary | ICD-10-CM | POA: Diagnosis not present

## 2016-03-23 DIAGNOSIS — M87052 Idiopathic aseptic necrosis of left femur: Secondary | ICD-10-CM | POA: Diagnosis not present

## 2016-03-23 DIAGNOSIS — N2581 Secondary hyperparathyroidism of renal origin: Secondary | ICD-10-CM | POA: Diagnosis not present

## 2016-03-23 DIAGNOSIS — Z471 Aftercare following joint replacement surgery: Secondary | ICD-10-CM | POA: Diagnosis not present

## 2016-03-23 DIAGNOSIS — I2510 Atherosclerotic heart disease of native coronary artery without angina pectoris: Secondary | ICD-10-CM | POA: Diagnosis not present

## 2016-03-23 DIAGNOSIS — E440 Moderate protein-calorie malnutrition: Secondary | ICD-10-CM | POA: Diagnosis not present

## 2016-03-25 DIAGNOSIS — E440 Moderate protein-calorie malnutrition: Secondary | ICD-10-CM | POA: Diagnosis not present

## 2016-03-25 DIAGNOSIS — D630 Anemia in neoplastic disease: Secondary | ICD-10-CM | POA: Diagnosis not present

## 2016-03-25 DIAGNOSIS — N186 End stage renal disease: Secondary | ICD-10-CM | POA: Diagnosis not present

## 2016-03-25 DIAGNOSIS — D509 Iron deficiency anemia, unspecified: Secondary | ICD-10-CM | POA: Diagnosis not present

## 2016-03-25 DIAGNOSIS — D631 Anemia in chronic kidney disease: Secondary | ICD-10-CM | POA: Diagnosis not present

## 2016-03-25 DIAGNOSIS — N2581 Secondary hyperparathyroidism of renal origin: Secondary | ICD-10-CM | POA: Diagnosis not present

## 2016-03-27 DIAGNOSIS — N2581 Secondary hyperparathyroidism of renal origin: Secondary | ICD-10-CM | POA: Diagnosis not present

## 2016-03-27 DIAGNOSIS — N186 End stage renal disease: Secondary | ICD-10-CM | POA: Diagnosis not present

## 2016-03-27 DIAGNOSIS — D630 Anemia in neoplastic disease: Secondary | ICD-10-CM | POA: Diagnosis not present

## 2016-03-27 DIAGNOSIS — D631 Anemia in chronic kidney disease: Secondary | ICD-10-CM | POA: Diagnosis not present

## 2016-03-27 DIAGNOSIS — D509 Iron deficiency anemia, unspecified: Secondary | ICD-10-CM | POA: Diagnosis not present

## 2016-03-27 DIAGNOSIS — E440 Moderate protein-calorie malnutrition: Secondary | ICD-10-CM | POA: Diagnosis not present

## 2016-03-29 DIAGNOSIS — D630 Anemia in neoplastic disease: Secondary | ICD-10-CM | POA: Diagnosis not present

## 2016-03-29 DIAGNOSIS — N186 End stage renal disease: Secondary | ICD-10-CM | POA: Diagnosis not present

## 2016-03-29 DIAGNOSIS — D509 Iron deficiency anemia, unspecified: Secondary | ICD-10-CM | POA: Diagnosis not present

## 2016-03-29 DIAGNOSIS — N2581 Secondary hyperparathyroidism of renal origin: Secondary | ICD-10-CM | POA: Diagnosis not present

## 2016-03-29 DIAGNOSIS — E440 Moderate protein-calorie malnutrition: Secondary | ICD-10-CM | POA: Diagnosis not present

## 2016-03-29 DIAGNOSIS — D631 Anemia in chronic kidney disease: Secondary | ICD-10-CM | POA: Diagnosis not present

## 2016-03-30 DIAGNOSIS — N2581 Secondary hyperparathyroidism of renal origin: Secondary | ICD-10-CM | POA: Diagnosis not present

## 2016-03-30 DIAGNOSIS — D631 Anemia in chronic kidney disease: Secondary | ICD-10-CM | POA: Diagnosis not present

## 2016-03-30 DIAGNOSIS — D509 Iron deficiency anemia, unspecified: Secondary | ICD-10-CM | POA: Diagnosis not present

## 2016-03-30 DIAGNOSIS — N186 End stage renal disease: Secondary | ICD-10-CM | POA: Diagnosis not present

## 2016-03-30 DIAGNOSIS — D630 Anemia in neoplastic disease: Secondary | ICD-10-CM | POA: Diagnosis not present

## 2016-03-30 DIAGNOSIS — E440 Moderate protein-calorie malnutrition: Secondary | ICD-10-CM | POA: Diagnosis not present

## 2016-03-31 DIAGNOSIS — D509 Iron deficiency anemia, unspecified: Secondary | ICD-10-CM | POA: Diagnosis not present

## 2016-03-31 DIAGNOSIS — D631 Anemia in chronic kidney disease: Secondary | ICD-10-CM | POA: Diagnosis not present

## 2016-03-31 DIAGNOSIS — N2581 Secondary hyperparathyroidism of renal origin: Secondary | ICD-10-CM | POA: Diagnosis not present

## 2016-03-31 DIAGNOSIS — E440 Moderate protein-calorie malnutrition: Secondary | ICD-10-CM | POA: Diagnosis not present

## 2016-03-31 DIAGNOSIS — D630 Anemia in neoplastic disease: Secondary | ICD-10-CM | POA: Diagnosis not present

## 2016-03-31 DIAGNOSIS — R8299 Other abnormal findings in urine: Secondary | ICD-10-CM | POA: Diagnosis not present

## 2016-03-31 DIAGNOSIS — E874 Mixed disorder of acid-base balance: Secondary | ICD-10-CM | POA: Diagnosis not present

## 2016-03-31 DIAGNOSIS — N186 End stage renal disease: Secondary | ICD-10-CM | POA: Diagnosis not present

## 2016-04-01 DIAGNOSIS — D509 Iron deficiency anemia, unspecified: Secondary | ICD-10-CM | POA: Diagnosis not present

## 2016-04-01 DIAGNOSIS — D630 Anemia in neoplastic disease: Secondary | ICD-10-CM | POA: Diagnosis not present

## 2016-04-01 DIAGNOSIS — E440 Moderate protein-calorie malnutrition: Secondary | ICD-10-CM | POA: Diagnosis not present

## 2016-04-01 DIAGNOSIS — N186 End stage renal disease: Secondary | ICD-10-CM | POA: Diagnosis not present

## 2016-04-01 DIAGNOSIS — N2581 Secondary hyperparathyroidism of renal origin: Secondary | ICD-10-CM | POA: Diagnosis not present

## 2016-04-01 DIAGNOSIS — D631 Anemia in chronic kidney disease: Secondary | ICD-10-CM | POA: Diagnosis not present

## 2016-04-03 DIAGNOSIS — D630 Anemia in neoplastic disease: Secondary | ICD-10-CM | POA: Diagnosis not present

## 2016-04-03 DIAGNOSIS — N186 End stage renal disease: Secondary | ICD-10-CM | POA: Diagnosis not present

## 2016-04-03 DIAGNOSIS — D509 Iron deficiency anemia, unspecified: Secondary | ICD-10-CM | POA: Diagnosis not present

## 2016-04-03 DIAGNOSIS — E440 Moderate protein-calorie malnutrition: Secondary | ICD-10-CM | POA: Diagnosis not present

## 2016-04-03 DIAGNOSIS — D631 Anemia in chronic kidney disease: Secondary | ICD-10-CM | POA: Diagnosis not present

## 2016-04-03 DIAGNOSIS — N2581 Secondary hyperparathyroidism of renal origin: Secondary | ICD-10-CM | POA: Diagnosis not present

## 2016-04-05 DIAGNOSIS — E784 Other hyperlipidemia: Secondary | ICD-10-CM | POA: Diagnosis not present

## 2016-04-05 DIAGNOSIS — D509 Iron deficiency anemia, unspecified: Secondary | ICD-10-CM | POA: Diagnosis not present

## 2016-04-05 DIAGNOSIS — D630 Anemia in neoplastic disease: Secondary | ICD-10-CM | POA: Diagnosis not present

## 2016-04-05 DIAGNOSIS — N2589 Other disorders resulting from impaired renal tubular function: Secondary | ICD-10-CM | POA: Diagnosis not present

## 2016-04-05 DIAGNOSIS — E440 Moderate protein-calorie malnutrition: Secondary | ICD-10-CM | POA: Diagnosis not present

## 2016-04-05 DIAGNOSIS — E874 Mixed disorder of acid-base balance: Secondary | ICD-10-CM | POA: Diagnosis not present

## 2016-04-05 DIAGNOSIS — Z79899 Other long term (current) drug therapy: Secondary | ICD-10-CM | POA: Diagnosis not present

## 2016-04-05 DIAGNOSIS — E7800 Pure hypercholesterolemia, unspecified: Secondary | ICD-10-CM | POA: Diagnosis not present

## 2016-04-05 DIAGNOSIS — R7989 Other specified abnormal findings of blood chemistry: Secondary | ICD-10-CM | POA: Diagnosis not present

## 2016-04-05 DIAGNOSIS — N2581 Secondary hyperparathyroidism of renal origin: Secondary | ICD-10-CM | POA: Diagnosis not present

## 2016-04-05 DIAGNOSIS — N186 End stage renal disease: Secondary | ICD-10-CM | POA: Diagnosis not present

## 2016-04-05 DIAGNOSIS — E8770 Fluid overload, unspecified: Secondary | ICD-10-CM | POA: Diagnosis not present

## 2016-04-06 DIAGNOSIS — N2589 Other disorders resulting from impaired renal tubular function: Secondary | ICD-10-CM | POA: Diagnosis not present

## 2016-04-06 DIAGNOSIS — Z79899 Other long term (current) drug therapy: Secondary | ICD-10-CM | POA: Diagnosis not present

## 2016-04-06 DIAGNOSIS — E440 Moderate protein-calorie malnutrition: Secondary | ICD-10-CM | POA: Diagnosis not present

## 2016-04-06 DIAGNOSIS — N186 End stage renal disease: Secondary | ICD-10-CM | POA: Diagnosis not present

## 2016-04-06 DIAGNOSIS — D630 Anemia in neoplastic disease: Secondary | ICD-10-CM | POA: Diagnosis not present

## 2016-04-06 DIAGNOSIS — D509 Iron deficiency anemia, unspecified: Secondary | ICD-10-CM | POA: Diagnosis not present

## 2016-04-08 DIAGNOSIS — Z79899 Other long term (current) drug therapy: Secondary | ICD-10-CM | POA: Diagnosis not present

## 2016-04-08 DIAGNOSIS — N2589 Other disorders resulting from impaired renal tubular function: Secondary | ICD-10-CM | POA: Diagnosis not present

## 2016-04-08 DIAGNOSIS — E440 Moderate protein-calorie malnutrition: Secondary | ICD-10-CM | POA: Diagnosis not present

## 2016-04-08 DIAGNOSIS — D509 Iron deficiency anemia, unspecified: Secondary | ICD-10-CM | POA: Diagnosis not present

## 2016-04-08 DIAGNOSIS — N186 End stage renal disease: Secondary | ICD-10-CM | POA: Diagnosis not present

## 2016-04-08 DIAGNOSIS — D630 Anemia in neoplastic disease: Secondary | ICD-10-CM | POA: Diagnosis not present

## 2016-04-10 DIAGNOSIS — D509 Iron deficiency anemia, unspecified: Secondary | ICD-10-CM | POA: Diagnosis not present

## 2016-04-10 DIAGNOSIS — D630 Anemia in neoplastic disease: Secondary | ICD-10-CM | POA: Diagnosis not present

## 2016-04-10 DIAGNOSIS — N186 End stage renal disease: Secondary | ICD-10-CM | POA: Diagnosis not present

## 2016-04-10 DIAGNOSIS — N2589 Other disorders resulting from impaired renal tubular function: Secondary | ICD-10-CM | POA: Diagnosis not present

## 2016-04-10 DIAGNOSIS — Z79899 Other long term (current) drug therapy: Secondary | ICD-10-CM | POA: Diagnosis not present

## 2016-04-10 DIAGNOSIS — E440 Moderate protein-calorie malnutrition: Secondary | ICD-10-CM | POA: Diagnosis not present

## 2016-04-12 DIAGNOSIS — N186 End stage renal disease: Secondary | ICD-10-CM | POA: Diagnosis not present

## 2016-04-12 DIAGNOSIS — N2589 Other disorders resulting from impaired renal tubular function: Secondary | ICD-10-CM | POA: Diagnosis not present

## 2016-04-12 DIAGNOSIS — N2581 Secondary hyperparathyroidism of renal origin: Secondary | ICD-10-CM | POA: Diagnosis not present

## 2016-04-12 DIAGNOSIS — D509 Iron deficiency anemia, unspecified: Secondary | ICD-10-CM | POA: Diagnosis not present

## 2016-04-12 DIAGNOSIS — D630 Anemia in neoplastic disease: Secondary | ICD-10-CM | POA: Diagnosis not present

## 2016-04-12 DIAGNOSIS — Z79899 Other long term (current) drug therapy: Secondary | ICD-10-CM | POA: Diagnosis not present

## 2016-04-12 DIAGNOSIS — E440 Moderate protein-calorie malnutrition: Secondary | ICD-10-CM | POA: Diagnosis not present

## 2016-04-13 DIAGNOSIS — N2581 Secondary hyperparathyroidism of renal origin: Secondary | ICD-10-CM | POA: Diagnosis not present

## 2016-04-13 DIAGNOSIS — N186 End stage renal disease: Secondary | ICD-10-CM | POA: Diagnosis not present

## 2016-04-14 DIAGNOSIS — N186 End stage renal disease: Secondary | ICD-10-CM | POA: Diagnosis not present

## 2016-04-14 DIAGNOSIS — N2581 Secondary hyperparathyroidism of renal origin: Secondary | ICD-10-CM | POA: Diagnosis not present

## 2016-04-15 DIAGNOSIS — E8779 Other fluid overload: Secondary | ICD-10-CM | POA: Diagnosis not present

## 2016-04-15 DIAGNOSIS — D631 Anemia in chronic kidney disease: Secondary | ICD-10-CM | POA: Diagnosis not present

## 2016-04-15 DIAGNOSIS — N186 End stage renal disease: Secondary | ICD-10-CM | POA: Diagnosis not present

## 2016-04-15 DIAGNOSIS — N2581 Secondary hyperparathyroidism of renal origin: Secondary | ICD-10-CM | POA: Diagnosis not present

## 2016-04-16 DIAGNOSIS — D631 Anemia in chronic kidney disease: Secondary | ICD-10-CM | POA: Diagnosis not present

## 2016-04-16 DIAGNOSIS — N186 End stage renal disease: Secondary | ICD-10-CM | POA: Diagnosis not present

## 2016-04-16 DIAGNOSIS — E8779 Other fluid overload: Secondary | ICD-10-CM | POA: Diagnosis not present

## 2016-04-16 DIAGNOSIS — N2581 Secondary hyperparathyroidism of renal origin: Secondary | ICD-10-CM | POA: Diagnosis not present

## 2016-04-19 DIAGNOSIS — E8779 Other fluid overload: Secondary | ICD-10-CM | POA: Diagnosis not present

## 2016-04-19 DIAGNOSIS — N186 End stage renal disease: Secondary | ICD-10-CM | POA: Diagnosis not present

## 2016-04-19 DIAGNOSIS — D631 Anemia in chronic kidney disease: Secondary | ICD-10-CM | POA: Diagnosis not present

## 2016-04-19 DIAGNOSIS — N2581 Secondary hyperparathyroidism of renal origin: Secondary | ICD-10-CM | POA: Diagnosis not present

## 2016-04-20 DIAGNOSIS — N2581 Secondary hyperparathyroidism of renal origin: Secondary | ICD-10-CM | POA: Diagnosis not present

## 2016-04-20 DIAGNOSIS — E8779 Other fluid overload: Secondary | ICD-10-CM | POA: Diagnosis not present

## 2016-04-20 DIAGNOSIS — Z114 Encounter for screening for human immunodeficiency virus [HIV]: Secondary | ICD-10-CM | POA: Diagnosis not present

## 2016-04-20 DIAGNOSIS — D631 Anemia in chronic kidney disease: Secondary | ICD-10-CM | POA: Diagnosis not present

## 2016-04-20 DIAGNOSIS — N186 End stage renal disease: Secondary | ICD-10-CM | POA: Diagnosis not present

## 2016-04-20 DIAGNOSIS — Z1159 Encounter for screening for other viral diseases: Secondary | ICD-10-CM | POA: Diagnosis not present

## 2016-04-21 DIAGNOSIS — N186 End stage renal disease: Secondary | ICD-10-CM | POA: Diagnosis not present

## 2016-04-21 DIAGNOSIS — D631 Anemia in chronic kidney disease: Secondary | ICD-10-CM | POA: Diagnosis not present

## 2016-04-21 DIAGNOSIS — N2581 Secondary hyperparathyroidism of renal origin: Secondary | ICD-10-CM | POA: Diagnosis not present

## 2016-04-21 DIAGNOSIS — E8779 Other fluid overload: Secondary | ICD-10-CM | POA: Diagnosis not present

## 2016-04-22 DIAGNOSIS — Z96641 Presence of right artificial hip joint: Secondary | ICD-10-CM | POA: Diagnosis not present

## 2016-04-22 DIAGNOSIS — Z471 Aftercare following joint replacement surgery: Secondary | ICD-10-CM | POA: Diagnosis not present

## 2016-04-23 DIAGNOSIS — E8779 Other fluid overload: Secondary | ICD-10-CM | POA: Diagnosis not present

## 2016-04-23 DIAGNOSIS — N2581 Secondary hyperparathyroidism of renal origin: Secondary | ICD-10-CM | POA: Diagnosis not present

## 2016-04-23 DIAGNOSIS — N186 End stage renal disease: Secondary | ICD-10-CM | POA: Diagnosis not present

## 2016-04-23 DIAGNOSIS — D631 Anemia in chronic kidney disease: Secondary | ICD-10-CM | POA: Diagnosis not present

## 2016-04-26 DIAGNOSIS — E8779 Other fluid overload: Secondary | ICD-10-CM | POA: Diagnosis not present

## 2016-04-26 DIAGNOSIS — D631 Anemia in chronic kidney disease: Secondary | ICD-10-CM | POA: Diagnosis not present

## 2016-04-26 DIAGNOSIS — N186 End stage renal disease: Secondary | ICD-10-CM | POA: Diagnosis not present

## 2016-04-26 DIAGNOSIS — N2581 Secondary hyperparathyroidism of renal origin: Secondary | ICD-10-CM | POA: Diagnosis not present

## 2016-04-28 DIAGNOSIS — D631 Anemia in chronic kidney disease: Secondary | ICD-10-CM | POA: Diagnosis not present

## 2016-04-28 DIAGNOSIS — N2581 Secondary hyperparathyroidism of renal origin: Secondary | ICD-10-CM | POA: Diagnosis not present

## 2016-04-28 DIAGNOSIS — N186 End stage renal disease: Secondary | ICD-10-CM | POA: Diagnosis not present

## 2016-04-28 DIAGNOSIS — E8779 Other fluid overload: Secondary | ICD-10-CM | POA: Diagnosis not present

## 2016-04-30 DIAGNOSIS — N2581 Secondary hyperparathyroidism of renal origin: Secondary | ICD-10-CM | POA: Diagnosis not present

## 2016-04-30 DIAGNOSIS — N186 End stage renal disease: Secondary | ICD-10-CM | POA: Diagnosis not present

## 2016-04-30 DIAGNOSIS — D631 Anemia in chronic kidney disease: Secondary | ICD-10-CM | POA: Diagnosis not present

## 2016-04-30 DIAGNOSIS — E8779 Other fluid overload: Secondary | ICD-10-CM | POA: Diagnosis not present

## 2016-05-03 DIAGNOSIS — N2581 Secondary hyperparathyroidism of renal origin: Secondary | ICD-10-CM | POA: Diagnosis not present

## 2016-05-03 DIAGNOSIS — E8779 Other fluid overload: Secondary | ICD-10-CM | POA: Diagnosis not present

## 2016-05-03 DIAGNOSIS — N186 End stage renal disease: Secondary | ICD-10-CM | POA: Diagnosis not present

## 2016-05-04 DIAGNOSIS — N186 End stage renal disease: Secondary | ICD-10-CM | POA: Diagnosis not present

## 2016-05-04 DIAGNOSIS — Z992 Dependence on renal dialysis: Secondary | ICD-10-CM | POA: Diagnosis not present

## 2016-05-05 DIAGNOSIS — D631 Anemia in chronic kidney disease: Secondary | ICD-10-CM | POA: Diagnosis not present

## 2016-05-05 DIAGNOSIS — N2581 Secondary hyperparathyroidism of renal origin: Secondary | ICD-10-CM | POA: Diagnosis not present

## 2016-05-05 DIAGNOSIS — E8779 Other fluid overload: Secondary | ICD-10-CM | POA: Diagnosis not present

## 2016-05-05 DIAGNOSIS — N186 End stage renal disease: Secondary | ICD-10-CM | POA: Diagnosis not present

## 2016-05-07 DIAGNOSIS — N186 End stage renal disease: Secondary | ICD-10-CM | POA: Diagnosis not present

## 2016-05-07 DIAGNOSIS — E8779 Other fluid overload: Secondary | ICD-10-CM | POA: Diagnosis not present

## 2016-05-07 DIAGNOSIS — D631 Anemia in chronic kidney disease: Secondary | ICD-10-CM | POA: Diagnosis not present

## 2016-05-07 DIAGNOSIS — N2581 Secondary hyperparathyroidism of renal origin: Secondary | ICD-10-CM | POA: Diagnosis not present

## 2016-05-10 DIAGNOSIS — N2581 Secondary hyperparathyroidism of renal origin: Secondary | ICD-10-CM | POA: Diagnosis not present

## 2016-05-10 DIAGNOSIS — D631 Anemia in chronic kidney disease: Secondary | ICD-10-CM | POA: Diagnosis not present

## 2016-05-10 DIAGNOSIS — N186 End stage renal disease: Secondary | ICD-10-CM | POA: Diagnosis not present

## 2016-05-10 DIAGNOSIS — E8779 Other fluid overload: Secondary | ICD-10-CM | POA: Diagnosis not present

## 2016-05-12 DIAGNOSIS — E8779 Other fluid overload: Secondary | ICD-10-CM | POA: Diagnosis not present

## 2016-05-12 DIAGNOSIS — N2581 Secondary hyperparathyroidism of renal origin: Secondary | ICD-10-CM | POA: Diagnosis not present

## 2016-05-12 DIAGNOSIS — N186 End stage renal disease: Secondary | ICD-10-CM | POA: Diagnosis not present

## 2016-05-12 DIAGNOSIS — D631 Anemia in chronic kidney disease: Secondary | ICD-10-CM | POA: Diagnosis not present

## 2016-05-14 DIAGNOSIS — D631 Anemia in chronic kidney disease: Secondary | ICD-10-CM | POA: Diagnosis not present

## 2016-05-14 DIAGNOSIS — N2581 Secondary hyperparathyroidism of renal origin: Secondary | ICD-10-CM | POA: Diagnosis not present

## 2016-05-14 DIAGNOSIS — N186 End stage renal disease: Secondary | ICD-10-CM | POA: Diagnosis not present

## 2016-05-14 DIAGNOSIS — E8779 Other fluid overload: Secondary | ICD-10-CM | POA: Diagnosis not present

## 2016-05-17 DIAGNOSIS — D631 Anemia in chronic kidney disease: Secondary | ICD-10-CM | POA: Diagnosis not present

## 2016-05-17 DIAGNOSIS — N2581 Secondary hyperparathyroidism of renal origin: Secondary | ICD-10-CM | POA: Diagnosis not present

## 2016-05-17 DIAGNOSIS — E8779 Other fluid overload: Secondary | ICD-10-CM | POA: Diagnosis not present

## 2016-05-17 DIAGNOSIS — N186 End stage renal disease: Secondary | ICD-10-CM | POA: Diagnosis not present

## 2016-05-18 DIAGNOSIS — N2581 Secondary hyperparathyroidism of renal origin: Secondary | ICD-10-CM | POA: Diagnosis not present

## 2016-05-18 DIAGNOSIS — N186 End stage renal disease: Secondary | ICD-10-CM | POA: Diagnosis not present

## 2016-05-18 DIAGNOSIS — D631 Anemia in chronic kidney disease: Secondary | ICD-10-CM | POA: Diagnosis not present

## 2016-05-18 DIAGNOSIS — E8779 Other fluid overload: Secondary | ICD-10-CM | POA: Diagnosis not present

## 2016-05-19 DIAGNOSIS — N2581 Secondary hyperparathyroidism of renal origin: Secondary | ICD-10-CM | POA: Diagnosis not present

## 2016-05-19 DIAGNOSIS — E8779 Other fluid overload: Secondary | ICD-10-CM | POA: Diagnosis not present

## 2016-05-19 DIAGNOSIS — N186 End stage renal disease: Secondary | ICD-10-CM | POA: Diagnosis not present

## 2016-05-19 DIAGNOSIS — D631 Anemia in chronic kidney disease: Secondary | ICD-10-CM | POA: Diagnosis not present

## 2016-05-21 DIAGNOSIS — N186 End stage renal disease: Secondary | ICD-10-CM | POA: Diagnosis not present

## 2016-05-21 DIAGNOSIS — E8779 Other fluid overload: Secondary | ICD-10-CM | POA: Diagnosis not present

## 2016-05-21 DIAGNOSIS — N2581 Secondary hyperparathyroidism of renal origin: Secondary | ICD-10-CM | POA: Diagnosis not present

## 2016-05-21 DIAGNOSIS — D631 Anemia in chronic kidney disease: Secondary | ICD-10-CM | POA: Diagnosis not present

## 2016-05-22 DIAGNOSIS — E8779 Other fluid overload: Secondary | ICD-10-CM | POA: Diagnosis not present

## 2016-05-22 DIAGNOSIS — N186 End stage renal disease: Secondary | ICD-10-CM | POA: Diagnosis not present

## 2016-05-22 DIAGNOSIS — N2581 Secondary hyperparathyroidism of renal origin: Secondary | ICD-10-CM | POA: Diagnosis not present

## 2016-05-22 DIAGNOSIS — D631 Anemia in chronic kidney disease: Secondary | ICD-10-CM | POA: Diagnosis not present

## 2016-05-24 DIAGNOSIS — D631 Anemia in chronic kidney disease: Secondary | ICD-10-CM | POA: Diagnosis not present

## 2016-05-24 DIAGNOSIS — N2581 Secondary hyperparathyroidism of renal origin: Secondary | ICD-10-CM | POA: Diagnosis not present

## 2016-05-24 DIAGNOSIS — E8779 Other fluid overload: Secondary | ICD-10-CM | POA: Diagnosis not present

## 2016-05-24 DIAGNOSIS — N186 End stage renal disease: Secondary | ICD-10-CM | POA: Diagnosis not present

## 2016-05-25 DIAGNOSIS — E8779 Other fluid overload: Secondary | ICD-10-CM | POA: Diagnosis not present

## 2016-05-25 DIAGNOSIS — D631 Anemia in chronic kidney disease: Secondary | ICD-10-CM | POA: Diagnosis not present

## 2016-05-25 DIAGNOSIS — N186 End stage renal disease: Secondary | ICD-10-CM | POA: Diagnosis not present

## 2016-05-25 DIAGNOSIS — N2581 Secondary hyperparathyroidism of renal origin: Secondary | ICD-10-CM | POA: Diagnosis not present

## 2016-05-26 DIAGNOSIS — N186 End stage renal disease: Secondary | ICD-10-CM | POA: Diagnosis not present

## 2016-05-26 DIAGNOSIS — D631 Anemia in chronic kidney disease: Secondary | ICD-10-CM | POA: Diagnosis not present

## 2016-05-26 DIAGNOSIS — E8779 Other fluid overload: Secondary | ICD-10-CM | POA: Diagnosis not present

## 2016-05-26 DIAGNOSIS — N2581 Secondary hyperparathyroidism of renal origin: Secondary | ICD-10-CM | POA: Diagnosis not present

## 2016-05-27 DIAGNOSIS — N186 End stage renal disease: Secondary | ICD-10-CM | POA: Diagnosis not present

## 2016-05-27 DIAGNOSIS — E8779 Other fluid overload: Secondary | ICD-10-CM | POA: Diagnosis not present

## 2016-05-27 DIAGNOSIS — N2581 Secondary hyperparathyroidism of renal origin: Secondary | ICD-10-CM | POA: Diagnosis not present

## 2016-05-27 DIAGNOSIS — D631 Anemia in chronic kidney disease: Secondary | ICD-10-CM | POA: Diagnosis not present

## 2016-05-28 DIAGNOSIS — N2581 Secondary hyperparathyroidism of renal origin: Secondary | ICD-10-CM | POA: Diagnosis not present

## 2016-05-28 DIAGNOSIS — E8779 Other fluid overload: Secondary | ICD-10-CM | POA: Diagnosis not present

## 2016-05-28 DIAGNOSIS — D631 Anemia in chronic kidney disease: Secondary | ICD-10-CM | POA: Diagnosis not present

## 2016-05-28 DIAGNOSIS — N186 End stage renal disease: Secondary | ICD-10-CM | POA: Diagnosis not present

## 2016-05-29 DIAGNOSIS — N2581 Secondary hyperparathyroidism of renal origin: Secondary | ICD-10-CM | POA: Diagnosis not present

## 2016-05-29 DIAGNOSIS — E8779 Other fluid overload: Secondary | ICD-10-CM | POA: Diagnosis not present

## 2016-05-29 DIAGNOSIS — D631 Anemia in chronic kidney disease: Secondary | ICD-10-CM | POA: Diagnosis not present

## 2016-05-29 DIAGNOSIS — N186 End stage renal disease: Secondary | ICD-10-CM | POA: Diagnosis not present

## 2016-05-31 DIAGNOSIS — E8779 Other fluid overload: Secondary | ICD-10-CM | POA: Diagnosis not present

## 2016-05-31 DIAGNOSIS — N2581 Secondary hyperparathyroidism of renal origin: Secondary | ICD-10-CM | POA: Diagnosis not present

## 2016-05-31 DIAGNOSIS — D631 Anemia in chronic kidney disease: Secondary | ICD-10-CM | POA: Diagnosis not present

## 2016-05-31 DIAGNOSIS — N186 End stage renal disease: Secondary | ICD-10-CM | POA: Diagnosis not present

## 2016-06-01 DIAGNOSIS — N186 End stage renal disease: Secondary | ICD-10-CM | POA: Diagnosis not present

## 2016-06-01 DIAGNOSIS — N2581 Secondary hyperparathyroidism of renal origin: Secondary | ICD-10-CM | POA: Diagnosis not present

## 2016-06-01 DIAGNOSIS — D631 Anemia in chronic kidney disease: Secondary | ICD-10-CM | POA: Diagnosis not present

## 2016-06-01 DIAGNOSIS — E8779 Other fluid overload: Secondary | ICD-10-CM | POA: Diagnosis not present

## 2016-06-02 DIAGNOSIS — N186 End stage renal disease: Secondary | ICD-10-CM | POA: Diagnosis not present

## 2016-06-02 DIAGNOSIS — N2581 Secondary hyperparathyroidism of renal origin: Secondary | ICD-10-CM | POA: Diagnosis not present

## 2016-06-02 DIAGNOSIS — D631 Anemia in chronic kidney disease: Secondary | ICD-10-CM | POA: Diagnosis not present

## 2016-06-02 DIAGNOSIS — E8779 Other fluid overload: Secondary | ICD-10-CM | POA: Diagnosis not present

## 2016-06-03 DIAGNOSIS — E8779 Other fluid overload: Secondary | ICD-10-CM | POA: Diagnosis not present

## 2016-06-03 DIAGNOSIS — N186 End stage renal disease: Secondary | ICD-10-CM | POA: Diagnosis not present

## 2016-06-03 DIAGNOSIS — D631 Anemia in chronic kidney disease: Secondary | ICD-10-CM | POA: Diagnosis not present

## 2016-06-03 DIAGNOSIS — N2581 Secondary hyperparathyroidism of renal origin: Secondary | ICD-10-CM | POA: Diagnosis not present

## 2016-06-03 DIAGNOSIS — Z992 Dependence on renal dialysis: Secondary | ICD-10-CM | POA: Diagnosis not present

## 2016-06-04 DIAGNOSIS — N2581 Secondary hyperparathyroidism of renal origin: Secondary | ICD-10-CM | POA: Diagnosis not present

## 2016-06-04 DIAGNOSIS — N186 End stage renal disease: Secondary | ICD-10-CM | POA: Diagnosis not present

## 2016-06-04 DIAGNOSIS — D631 Anemia in chronic kidney disease: Secondary | ICD-10-CM | POA: Diagnosis not present

## 2016-06-05 DIAGNOSIS — N186 End stage renal disease: Secondary | ICD-10-CM | POA: Diagnosis not present

## 2016-06-05 DIAGNOSIS — N2581 Secondary hyperparathyroidism of renal origin: Secondary | ICD-10-CM | POA: Diagnosis not present

## 2016-06-05 DIAGNOSIS — D631 Anemia in chronic kidney disease: Secondary | ICD-10-CM | POA: Diagnosis not present

## 2016-06-07 DIAGNOSIS — N186 End stage renal disease: Secondary | ICD-10-CM | POA: Diagnosis not present

## 2016-06-07 DIAGNOSIS — D631 Anemia in chronic kidney disease: Secondary | ICD-10-CM | POA: Diagnosis not present

## 2016-06-07 DIAGNOSIS — N2581 Secondary hyperparathyroidism of renal origin: Secondary | ICD-10-CM | POA: Diagnosis not present

## 2016-06-08 DIAGNOSIS — D631 Anemia in chronic kidney disease: Secondary | ICD-10-CM | POA: Diagnosis not present

## 2016-06-08 DIAGNOSIS — N2581 Secondary hyperparathyroidism of renal origin: Secondary | ICD-10-CM | POA: Diagnosis not present

## 2016-06-08 DIAGNOSIS — N186 End stage renal disease: Secondary | ICD-10-CM | POA: Diagnosis not present

## 2016-06-09 DIAGNOSIS — N186 End stage renal disease: Secondary | ICD-10-CM | POA: Diagnosis not present

## 2016-06-09 DIAGNOSIS — D631 Anemia in chronic kidney disease: Secondary | ICD-10-CM | POA: Diagnosis not present

## 2016-06-09 DIAGNOSIS — N2581 Secondary hyperparathyroidism of renal origin: Secondary | ICD-10-CM | POA: Diagnosis not present

## 2016-06-10 DIAGNOSIS — I120 Hypertensive chronic kidney disease with stage 5 chronic kidney disease or end stage renal disease: Secondary | ICD-10-CM | POA: Diagnosis not present

## 2016-06-10 DIAGNOSIS — I129 Hypertensive chronic kidney disease with stage 1 through stage 4 chronic kidney disease, or unspecified chronic kidney disease: Secondary | ICD-10-CM | POA: Diagnosis not present

## 2016-06-10 DIAGNOSIS — D631 Anemia in chronic kidney disease: Secondary | ICD-10-CM | POA: Diagnosis not present

## 2016-06-10 DIAGNOSIS — R5383 Other fatigue: Secondary | ICD-10-CM | POA: Diagnosis not present

## 2016-06-10 DIAGNOSIS — N2581 Secondary hyperparathyroidism of renal origin: Secondary | ICD-10-CM | POA: Diagnosis not present

## 2016-06-10 DIAGNOSIS — Z01811 Encounter for preprocedural respiratory examination: Secondary | ICD-10-CM | POA: Diagnosis not present

## 2016-06-10 DIAGNOSIS — R0683 Snoring: Secondary | ICD-10-CM | POA: Diagnosis not present

## 2016-06-10 DIAGNOSIS — N186 End stage renal disease: Secondary | ICD-10-CM | POA: Diagnosis not present

## 2016-06-10 DIAGNOSIS — G4719 Other hypersomnia: Secondary | ICD-10-CM | POA: Diagnosis not present

## 2016-06-10 DIAGNOSIS — G4730 Sleep apnea, unspecified: Secondary | ICD-10-CM | POA: Diagnosis not present

## 2016-06-11 DIAGNOSIS — D631 Anemia in chronic kidney disease: Secondary | ICD-10-CM | POA: Diagnosis not present

## 2016-06-11 DIAGNOSIS — N2581 Secondary hyperparathyroidism of renal origin: Secondary | ICD-10-CM | POA: Diagnosis not present

## 2016-06-11 DIAGNOSIS — N186 End stage renal disease: Secondary | ICD-10-CM | POA: Diagnosis not present

## 2016-06-12 DIAGNOSIS — N186 End stage renal disease: Secondary | ICD-10-CM | POA: Diagnosis not present

## 2016-06-12 DIAGNOSIS — D631 Anemia in chronic kidney disease: Secondary | ICD-10-CM | POA: Diagnosis not present

## 2016-06-12 DIAGNOSIS — N2581 Secondary hyperparathyroidism of renal origin: Secondary | ICD-10-CM | POA: Diagnosis not present

## 2016-06-14 DIAGNOSIS — N186 End stage renal disease: Secondary | ICD-10-CM | POA: Diagnosis not present

## 2016-06-14 DIAGNOSIS — D631 Anemia in chronic kidney disease: Secondary | ICD-10-CM | POA: Diagnosis not present

## 2016-06-14 DIAGNOSIS — N2581 Secondary hyperparathyroidism of renal origin: Secondary | ICD-10-CM | POA: Diagnosis not present

## 2016-06-15 DIAGNOSIS — N186 End stage renal disease: Secondary | ICD-10-CM | POA: Diagnosis not present

## 2016-06-15 DIAGNOSIS — N2581 Secondary hyperparathyroidism of renal origin: Secondary | ICD-10-CM | POA: Diagnosis not present

## 2016-06-15 DIAGNOSIS — D631 Anemia in chronic kidney disease: Secondary | ICD-10-CM | POA: Diagnosis not present

## 2016-06-16 DIAGNOSIS — N186 End stage renal disease: Secondary | ICD-10-CM | POA: Diagnosis not present

## 2016-06-16 DIAGNOSIS — N2581 Secondary hyperparathyroidism of renal origin: Secondary | ICD-10-CM | POA: Diagnosis not present

## 2016-06-16 DIAGNOSIS — D631 Anemia in chronic kidney disease: Secondary | ICD-10-CM | POA: Diagnosis not present

## 2016-06-17 DIAGNOSIS — D631 Anemia in chronic kidney disease: Secondary | ICD-10-CM | POA: Diagnosis not present

## 2016-06-17 DIAGNOSIS — N2581 Secondary hyperparathyroidism of renal origin: Secondary | ICD-10-CM | POA: Diagnosis not present

## 2016-06-17 DIAGNOSIS — N186 End stage renal disease: Secondary | ICD-10-CM | POA: Diagnosis not present

## 2016-06-18 DIAGNOSIS — D631 Anemia in chronic kidney disease: Secondary | ICD-10-CM | POA: Diagnosis not present

## 2016-06-18 DIAGNOSIS — N186 End stage renal disease: Secondary | ICD-10-CM | POA: Diagnosis not present

## 2016-06-18 DIAGNOSIS — N2581 Secondary hyperparathyroidism of renal origin: Secondary | ICD-10-CM | POA: Diagnosis not present

## 2016-06-19 DIAGNOSIS — N186 End stage renal disease: Secondary | ICD-10-CM | POA: Diagnosis not present

## 2016-06-19 DIAGNOSIS — D631 Anemia in chronic kidney disease: Secondary | ICD-10-CM | POA: Diagnosis not present

## 2016-06-19 DIAGNOSIS — N2581 Secondary hyperparathyroidism of renal origin: Secondary | ICD-10-CM | POA: Diagnosis not present

## 2016-06-21 DIAGNOSIS — N2581 Secondary hyperparathyroidism of renal origin: Secondary | ICD-10-CM | POA: Diagnosis not present

## 2016-06-21 DIAGNOSIS — D631 Anemia in chronic kidney disease: Secondary | ICD-10-CM | POA: Diagnosis not present

## 2016-06-21 DIAGNOSIS — N186 End stage renal disease: Secondary | ICD-10-CM | POA: Diagnosis not present

## 2016-06-22 DIAGNOSIS — N186 End stage renal disease: Secondary | ICD-10-CM | POA: Diagnosis not present

## 2016-06-22 DIAGNOSIS — D631 Anemia in chronic kidney disease: Secondary | ICD-10-CM | POA: Diagnosis not present

## 2016-06-22 DIAGNOSIS — N2581 Secondary hyperparathyroidism of renal origin: Secondary | ICD-10-CM | POA: Diagnosis not present

## 2016-06-23 DIAGNOSIS — D631 Anemia in chronic kidney disease: Secondary | ICD-10-CM | POA: Diagnosis not present

## 2016-06-23 DIAGNOSIS — N2581 Secondary hyperparathyroidism of renal origin: Secondary | ICD-10-CM | POA: Diagnosis not present

## 2016-06-23 DIAGNOSIS — N186 End stage renal disease: Secondary | ICD-10-CM | POA: Diagnosis not present

## 2016-06-24 DIAGNOSIS — N186 End stage renal disease: Secondary | ICD-10-CM | POA: Diagnosis not present

## 2016-06-24 DIAGNOSIS — N2581 Secondary hyperparathyroidism of renal origin: Secondary | ICD-10-CM | POA: Diagnosis not present

## 2016-06-24 DIAGNOSIS — D631 Anemia in chronic kidney disease: Secondary | ICD-10-CM | POA: Diagnosis not present

## 2016-06-25 DIAGNOSIS — N2581 Secondary hyperparathyroidism of renal origin: Secondary | ICD-10-CM | POA: Diagnosis not present

## 2016-06-25 DIAGNOSIS — N186 End stage renal disease: Secondary | ICD-10-CM | POA: Diagnosis not present

## 2016-06-25 DIAGNOSIS — D631 Anemia in chronic kidney disease: Secondary | ICD-10-CM | POA: Diagnosis not present

## 2016-06-26 DIAGNOSIS — N186 End stage renal disease: Secondary | ICD-10-CM | POA: Diagnosis not present

## 2016-06-26 DIAGNOSIS — D631 Anemia in chronic kidney disease: Secondary | ICD-10-CM | POA: Diagnosis not present

## 2016-06-26 DIAGNOSIS — N2581 Secondary hyperparathyroidism of renal origin: Secondary | ICD-10-CM | POA: Diagnosis not present

## 2016-06-27 DIAGNOSIS — D631 Anemia in chronic kidney disease: Secondary | ICD-10-CM | POA: Diagnosis not present

## 2016-06-27 DIAGNOSIS — N2581 Secondary hyperparathyroidism of renal origin: Secondary | ICD-10-CM | POA: Diagnosis not present

## 2016-06-27 DIAGNOSIS — N186 End stage renal disease: Secondary | ICD-10-CM | POA: Diagnosis not present

## 2016-06-28 DIAGNOSIS — D631 Anemia in chronic kidney disease: Secondary | ICD-10-CM | POA: Diagnosis not present

## 2016-06-28 DIAGNOSIS — N2581 Secondary hyperparathyroidism of renal origin: Secondary | ICD-10-CM | POA: Diagnosis not present

## 2016-06-28 DIAGNOSIS — N186 End stage renal disease: Secondary | ICD-10-CM | POA: Diagnosis not present

## 2016-06-29 DIAGNOSIS — D631 Anemia in chronic kidney disease: Secondary | ICD-10-CM | POA: Diagnosis not present

## 2016-06-29 DIAGNOSIS — I071 Rheumatic tricuspid insufficiency: Secondary | ICD-10-CM | POA: Diagnosis not present

## 2016-06-29 DIAGNOSIS — N186 End stage renal disease: Secondary | ICD-10-CM | POA: Diagnosis not present

## 2016-06-29 DIAGNOSIS — I10 Essential (primary) hypertension: Secondary | ICD-10-CM | POA: Diagnosis not present

## 2016-06-29 DIAGNOSIS — N2581 Secondary hyperparathyroidism of renal origin: Secondary | ICD-10-CM | POA: Diagnosis not present

## 2016-06-30 DIAGNOSIS — N2581 Secondary hyperparathyroidism of renal origin: Secondary | ICD-10-CM | POA: Diagnosis not present

## 2016-06-30 DIAGNOSIS — D631 Anemia in chronic kidney disease: Secondary | ICD-10-CM | POA: Diagnosis not present

## 2016-06-30 DIAGNOSIS — N186 End stage renal disease: Secondary | ICD-10-CM | POA: Diagnosis not present

## 2016-07-01 DIAGNOSIS — D631 Anemia in chronic kidney disease: Secondary | ICD-10-CM | POA: Diagnosis not present

## 2016-07-01 DIAGNOSIS — N2581 Secondary hyperparathyroidism of renal origin: Secondary | ICD-10-CM | POA: Diagnosis not present

## 2016-07-01 DIAGNOSIS — N186 End stage renal disease: Secondary | ICD-10-CM | POA: Diagnosis not present

## 2016-07-02 DIAGNOSIS — N2581 Secondary hyperparathyroidism of renal origin: Secondary | ICD-10-CM | POA: Diagnosis not present

## 2016-07-02 DIAGNOSIS — N186 End stage renal disease: Secondary | ICD-10-CM | POA: Diagnosis not present

## 2016-07-02 DIAGNOSIS — D631 Anemia in chronic kidney disease: Secondary | ICD-10-CM | POA: Diagnosis not present

## 2016-07-03 DIAGNOSIS — N2581 Secondary hyperparathyroidism of renal origin: Secondary | ICD-10-CM | POA: Diagnosis not present

## 2016-07-03 DIAGNOSIS — N186 End stage renal disease: Secondary | ICD-10-CM | POA: Diagnosis not present

## 2016-07-03 DIAGNOSIS — D631 Anemia in chronic kidney disease: Secondary | ICD-10-CM | POA: Diagnosis not present

## 2016-07-04 DIAGNOSIS — N186 End stage renal disease: Secondary | ICD-10-CM | POA: Diagnosis not present

## 2016-07-04 DIAGNOSIS — D631 Anemia in chronic kidney disease: Secondary | ICD-10-CM | POA: Diagnosis not present

## 2016-07-04 DIAGNOSIS — N2581 Secondary hyperparathyroidism of renal origin: Secondary | ICD-10-CM | POA: Diagnosis not present

## 2016-07-04 DIAGNOSIS — Z992 Dependence on renal dialysis: Secondary | ICD-10-CM | POA: Diagnosis not present

## 2016-07-05 DIAGNOSIS — N2581 Secondary hyperparathyroidism of renal origin: Secondary | ICD-10-CM | POA: Diagnosis not present

## 2016-07-05 DIAGNOSIS — N186 End stage renal disease: Secondary | ICD-10-CM | POA: Diagnosis not present

## 2016-07-05 DIAGNOSIS — D631 Anemia in chronic kidney disease: Secondary | ICD-10-CM | POA: Diagnosis not present

## 2016-07-06 DIAGNOSIS — N2581 Secondary hyperparathyroidism of renal origin: Secondary | ICD-10-CM | POA: Diagnosis not present

## 2016-07-06 DIAGNOSIS — N186 End stage renal disease: Secondary | ICD-10-CM | POA: Diagnosis not present

## 2016-07-06 DIAGNOSIS — D631 Anemia in chronic kidney disease: Secondary | ICD-10-CM | POA: Diagnosis not present

## 2016-07-07 DIAGNOSIS — N2581 Secondary hyperparathyroidism of renal origin: Secondary | ICD-10-CM | POA: Diagnosis not present

## 2016-07-07 DIAGNOSIS — N186 End stage renal disease: Secondary | ICD-10-CM | POA: Diagnosis not present

## 2016-07-07 DIAGNOSIS — D631 Anemia in chronic kidney disease: Secondary | ICD-10-CM | POA: Diagnosis not present

## 2016-07-08 DIAGNOSIS — N2581 Secondary hyperparathyroidism of renal origin: Secondary | ICD-10-CM | POA: Diagnosis not present

## 2016-07-08 DIAGNOSIS — D631 Anemia in chronic kidney disease: Secondary | ICD-10-CM | POA: Diagnosis not present

## 2016-07-08 DIAGNOSIS — N186 End stage renal disease: Secondary | ICD-10-CM | POA: Diagnosis not present

## 2016-07-09 DIAGNOSIS — N186 End stage renal disease: Secondary | ICD-10-CM | POA: Diagnosis not present

## 2016-07-09 DIAGNOSIS — D631 Anemia in chronic kidney disease: Secondary | ICD-10-CM | POA: Diagnosis not present

## 2016-07-09 DIAGNOSIS — N2581 Secondary hyperparathyroidism of renal origin: Secondary | ICD-10-CM | POA: Diagnosis not present

## 2016-07-10 DIAGNOSIS — D631 Anemia in chronic kidney disease: Secondary | ICD-10-CM | POA: Diagnosis not present

## 2016-07-10 DIAGNOSIS — N186 End stage renal disease: Secondary | ICD-10-CM | POA: Diagnosis not present

## 2016-07-10 DIAGNOSIS — N2581 Secondary hyperparathyroidism of renal origin: Secondary | ICD-10-CM | POA: Diagnosis not present

## 2016-07-11 DIAGNOSIS — D631 Anemia in chronic kidney disease: Secondary | ICD-10-CM | POA: Diagnosis not present

## 2016-07-11 DIAGNOSIS — N186 End stage renal disease: Secondary | ICD-10-CM | POA: Diagnosis not present

## 2016-07-11 DIAGNOSIS — N2581 Secondary hyperparathyroidism of renal origin: Secondary | ICD-10-CM | POA: Diagnosis not present

## 2016-07-12 DIAGNOSIS — I120 Hypertensive chronic kidney disease with stage 5 chronic kidney disease or end stage renal disease: Secondary | ICD-10-CM | POA: Diagnosis not present

## 2016-07-12 DIAGNOSIS — N186 End stage renal disease: Secondary | ICD-10-CM | POA: Diagnosis not present

## 2016-07-12 DIAGNOSIS — Z1211 Encounter for screening for malignant neoplasm of colon: Secondary | ICD-10-CM | POA: Diagnosis not present

## 2016-07-12 DIAGNOSIS — Z888 Allergy status to other drugs, medicaments and biological substances status: Secondary | ICD-10-CM | POA: Diagnosis not present

## 2016-07-12 DIAGNOSIS — D631 Anemia in chronic kidney disease: Secondary | ICD-10-CM | POA: Diagnosis not present

## 2016-07-12 DIAGNOSIS — R011 Cardiac murmur, unspecified: Secondary | ICD-10-CM | POA: Diagnosis not present

## 2016-07-12 DIAGNOSIS — N2581 Secondary hyperparathyroidism of renal origin: Secondary | ICD-10-CM | POA: Diagnosis not present

## 2016-07-12 DIAGNOSIS — I10 Essential (primary) hypertension: Secondary | ICD-10-CM | POA: Diagnosis not present

## 2016-07-13 DIAGNOSIS — T829XXA Unspecified complication of cardiac and vascular prosthetic device, implant and graft, initial encounter: Secondary | ICD-10-CM | POA: Diagnosis not present

## 2016-07-13 DIAGNOSIS — D631 Anemia in chronic kidney disease: Secondary | ICD-10-CM | POA: Diagnosis not present

## 2016-07-13 DIAGNOSIS — T82898A Other specified complication of vascular prosthetic devices, implants and grafts, initial encounter: Secondary | ICD-10-CM | POA: Diagnosis not present

## 2016-07-13 DIAGNOSIS — I770 Arteriovenous fistula, acquired: Secondary | ICD-10-CM | POA: Diagnosis not present

## 2016-07-13 DIAGNOSIS — N186 End stage renal disease: Secondary | ICD-10-CM | POA: Diagnosis not present

## 2016-07-13 DIAGNOSIS — N2581 Secondary hyperparathyroidism of renal origin: Secondary | ICD-10-CM | POA: Diagnosis not present

## 2016-07-14 DIAGNOSIS — D631 Anemia in chronic kidney disease: Secondary | ICD-10-CM | POA: Diagnosis not present

## 2016-07-14 DIAGNOSIS — R918 Other nonspecific abnormal finding of lung field: Secondary | ICD-10-CM | POA: Diagnosis not present

## 2016-07-14 DIAGNOSIS — N2581 Secondary hyperparathyroidism of renal origin: Secondary | ICD-10-CM | POA: Diagnosis not present

## 2016-07-14 DIAGNOSIS — I672 Cerebral atherosclerosis: Secondary | ICD-10-CM | POA: Diagnosis not present

## 2016-07-14 DIAGNOSIS — R05 Cough: Secondary | ICD-10-CM | POA: Diagnosis not present

## 2016-07-14 DIAGNOSIS — J3489 Other specified disorders of nose and nasal sinuses: Secondary | ICD-10-CM | POA: Diagnosis not present

## 2016-07-14 DIAGNOSIS — J984 Other disorders of lung: Secondary | ICD-10-CM | POA: Diagnosis not present

## 2016-07-14 DIAGNOSIS — N189 Chronic kidney disease, unspecified: Secondary | ICD-10-CM | POA: Diagnosis not present

## 2016-07-14 DIAGNOSIS — R413 Other amnesia: Secondary | ICD-10-CM | POA: Diagnosis not present

## 2016-07-14 DIAGNOSIS — N186 End stage renal disease: Secondary | ICD-10-CM | POA: Diagnosis not present

## 2016-07-14 DIAGNOSIS — G9340 Encephalopathy, unspecified: Secondary | ICD-10-CM | POA: Diagnosis not present

## 2016-07-14 DIAGNOSIS — I2510 Atherosclerotic heart disease of native coronary artery without angina pectoris: Secondary | ICD-10-CM | POA: Diagnosis not present

## 2016-07-16 DIAGNOSIS — I959 Hypotension, unspecified: Secondary | ICD-10-CM | POA: Diagnosis not present

## 2016-07-16 DIAGNOSIS — D631 Anemia in chronic kidney disease: Secondary | ICD-10-CM | POA: Diagnosis not present

## 2016-07-16 DIAGNOSIS — N186 End stage renal disease: Secondary | ICD-10-CM | POA: Diagnosis not present

## 2016-07-16 DIAGNOSIS — R252 Cramp and spasm: Secondary | ICD-10-CM | POA: Diagnosis not present

## 2016-07-19 DIAGNOSIS — D631 Anemia in chronic kidney disease: Secondary | ICD-10-CM | POA: Diagnosis not present

## 2016-07-19 DIAGNOSIS — I959 Hypotension, unspecified: Secondary | ICD-10-CM | POA: Diagnosis not present

## 2016-07-19 DIAGNOSIS — R252 Cramp and spasm: Secondary | ICD-10-CM | POA: Diagnosis not present

## 2016-07-19 DIAGNOSIS — N186 End stage renal disease: Secondary | ICD-10-CM | POA: Diagnosis not present

## 2016-07-23 DIAGNOSIS — R252 Cramp and spasm: Secondary | ICD-10-CM | POA: Diagnosis not present

## 2016-07-23 DIAGNOSIS — D631 Anemia in chronic kidney disease: Secondary | ICD-10-CM | POA: Diagnosis not present

## 2016-07-23 DIAGNOSIS — I959 Hypotension, unspecified: Secondary | ICD-10-CM | POA: Diagnosis not present

## 2016-07-23 DIAGNOSIS — N186 End stage renal disease: Secondary | ICD-10-CM | POA: Diagnosis not present

## 2016-07-26 DIAGNOSIS — D631 Anemia in chronic kidney disease: Secondary | ICD-10-CM | POA: Diagnosis not present

## 2016-07-26 DIAGNOSIS — I959 Hypotension, unspecified: Secondary | ICD-10-CM | POA: Diagnosis not present

## 2016-07-26 DIAGNOSIS — R252 Cramp and spasm: Secondary | ICD-10-CM | POA: Diagnosis not present

## 2016-07-26 DIAGNOSIS — N186 End stage renal disease: Secondary | ICD-10-CM | POA: Diagnosis not present

## 2016-07-28 DIAGNOSIS — I959 Hypotension, unspecified: Secondary | ICD-10-CM | POA: Diagnosis not present

## 2016-07-28 DIAGNOSIS — R252 Cramp and spasm: Secondary | ICD-10-CM | POA: Diagnosis not present

## 2016-07-28 DIAGNOSIS — N186 End stage renal disease: Secondary | ICD-10-CM | POA: Diagnosis not present

## 2016-07-28 DIAGNOSIS — D631 Anemia in chronic kidney disease: Secondary | ICD-10-CM | POA: Diagnosis not present

## 2016-07-30 DIAGNOSIS — D631 Anemia in chronic kidney disease: Secondary | ICD-10-CM | POA: Diagnosis not present

## 2016-07-30 DIAGNOSIS — I959 Hypotension, unspecified: Secondary | ICD-10-CM | POA: Diagnosis not present

## 2016-07-30 DIAGNOSIS — N186 End stage renal disease: Secondary | ICD-10-CM | POA: Diagnosis not present

## 2016-07-30 DIAGNOSIS — R252 Cramp and spasm: Secondary | ICD-10-CM | POA: Diagnosis not present

## 2016-08-03 DIAGNOSIS — R252 Cramp and spasm: Secondary | ICD-10-CM | POA: Diagnosis not present

## 2016-08-03 DIAGNOSIS — D631 Anemia in chronic kidney disease: Secondary | ICD-10-CM | POA: Diagnosis not present

## 2016-08-03 DIAGNOSIS — N186 End stage renal disease: Secondary | ICD-10-CM | POA: Diagnosis not present

## 2016-08-03 DIAGNOSIS — I959 Hypotension, unspecified: Secondary | ICD-10-CM | POA: Diagnosis not present

## 2016-08-04 DIAGNOSIS — I959 Hypotension, unspecified: Secondary | ICD-10-CM | POA: Diagnosis not present

## 2016-08-04 DIAGNOSIS — D631 Anemia in chronic kidney disease: Secondary | ICD-10-CM | POA: Diagnosis not present

## 2016-08-04 DIAGNOSIS — N186 End stage renal disease: Secondary | ICD-10-CM | POA: Diagnosis not present

## 2016-08-04 DIAGNOSIS — R252 Cramp and spasm: Secondary | ICD-10-CM | POA: Diagnosis not present

## 2016-08-04 DIAGNOSIS — Z992 Dependence on renal dialysis: Secondary | ICD-10-CM | POA: Diagnosis not present

## 2016-08-05 DIAGNOSIS — E8779 Other fluid overload: Secondary | ICD-10-CM | POA: Diagnosis not present

## 2016-08-05 DIAGNOSIS — N186 End stage renal disease: Secondary | ICD-10-CM | POA: Diagnosis not present

## 2016-08-05 DIAGNOSIS — N2581 Secondary hyperparathyroidism of renal origin: Secondary | ICD-10-CM | POA: Diagnosis not present

## 2016-08-05 DIAGNOSIS — D631 Anemia in chronic kidney disease: Secondary | ICD-10-CM | POA: Diagnosis not present

## 2016-08-09 DIAGNOSIS — N2581 Secondary hyperparathyroidism of renal origin: Secondary | ICD-10-CM | POA: Diagnosis not present

## 2016-08-09 DIAGNOSIS — N186 End stage renal disease: Secondary | ICD-10-CM | POA: Diagnosis not present

## 2016-08-09 DIAGNOSIS — E8779 Other fluid overload: Secondary | ICD-10-CM | POA: Diagnosis not present

## 2016-08-09 DIAGNOSIS — D631 Anemia in chronic kidney disease: Secondary | ICD-10-CM | POA: Diagnosis not present

## 2016-08-11 DIAGNOSIS — N2581 Secondary hyperparathyroidism of renal origin: Secondary | ICD-10-CM | POA: Diagnosis not present

## 2016-08-11 DIAGNOSIS — N186 End stage renal disease: Secondary | ICD-10-CM | POA: Diagnosis not present

## 2016-08-11 DIAGNOSIS — E8779 Other fluid overload: Secondary | ICD-10-CM | POA: Diagnosis not present

## 2016-08-11 DIAGNOSIS — D631 Anemia in chronic kidney disease: Secondary | ICD-10-CM | POA: Diagnosis not present

## 2016-08-13 DIAGNOSIS — D631 Anemia in chronic kidney disease: Secondary | ICD-10-CM | POA: Diagnosis not present

## 2016-08-13 DIAGNOSIS — N186 End stage renal disease: Secondary | ICD-10-CM | POA: Diagnosis not present

## 2016-08-13 DIAGNOSIS — N2581 Secondary hyperparathyroidism of renal origin: Secondary | ICD-10-CM | POA: Diagnosis not present

## 2016-08-13 DIAGNOSIS — E8779 Other fluid overload: Secondary | ICD-10-CM | POA: Diagnosis not present

## 2016-08-16 DIAGNOSIS — N2581 Secondary hyperparathyroidism of renal origin: Secondary | ICD-10-CM | POA: Diagnosis not present

## 2016-08-16 DIAGNOSIS — N186 End stage renal disease: Secondary | ICD-10-CM | POA: Diagnosis not present

## 2016-08-16 DIAGNOSIS — E8779 Other fluid overload: Secondary | ICD-10-CM | POA: Diagnosis not present

## 2016-08-16 DIAGNOSIS — D631 Anemia in chronic kidney disease: Secondary | ICD-10-CM | POA: Diagnosis not present

## 2016-08-18 DIAGNOSIS — N186 End stage renal disease: Secondary | ICD-10-CM | POA: Diagnosis not present

## 2016-08-18 DIAGNOSIS — E8779 Other fluid overload: Secondary | ICD-10-CM | POA: Diagnosis not present

## 2016-08-18 DIAGNOSIS — N2581 Secondary hyperparathyroidism of renal origin: Secondary | ICD-10-CM | POA: Diagnosis not present

## 2016-08-18 DIAGNOSIS — D631 Anemia in chronic kidney disease: Secondary | ICD-10-CM | POA: Diagnosis not present

## 2016-08-19 DIAGNOSIS — N218 Other lower urinary tract calculus: Secondary | ICD-10-CM | POA: Diagnosis not present

## 2016-08-19 DIAGNOSIS — N529 Male erectile dysfunction, unspecified: Secondary | ICD-10-CM | POA: Diagnosis not present

## 2016-08-20 DIAGNOSIS — N186 End stage renal disease: Secondary | ICD-10-CM | POA: Diagnosis not present

## 2016-08-20 DIAGNOSIS — N2581 Secondary hyperparathyroidism of renal origin: Secondary | ICD-10-CM | POA: Diagnosis not present

## 2016-08-20 DIAGNOSIS — E8779 Other fluid overload: Secondary | ICD-10-CM | POA: Diagnosis not present

## 2016-08-20 DIAGNOSIS — D631 Anemia in chronic kidney disease: Secondary | ICD-10-CM | POA: Diagnosis not present

## 2016-08-23 DIAGNOSIS — N186 End stage renal disease: Secondary | ICD-10-CM | POA: Diagnosis not present

## 2016-08-23 DIAGNOSIS — D631 Anemia in chronic kidney disease: Secondary | ICD-10-CM | POA: Diagnosis not present

## 2016-08-23 DIAGNOSIS — N2581 Secondary hyperparathyroidism of renal origin: Secondary | ICD-10-CM | POA: Diagnosis not present

## 2016-08-23 DIAGNOSIS — E8779 Other fluid overload: Secondary | ICD-10-CM | POA: Diagnosis not present

## 2016-08-25 DIAGNOSIS — N186 End stage renal disease: Secondary | ICD-10-CM | POA: Diagnosis not present

## 2016-08-25 DIAGNOSIS — N2581 Secondary hyperparathyroidism of renal origin: Secondary | ICD-10-CM | POA: Diagnosis not present

## 2016-08-25 DIAGNOSIS — E8779 Other fluid overload: Secondary | ICD-10-CM | POA: Diagnosis not present

## 2016-08-25 DIAGNOSIS — D631 Anemia in chronic kidney disease: Secondary | ICD-10-CM | POA: Diagnosis not present

## 2016-08-27 DIAGNOSIS — N2581 Secondary hyperparathyroidism of renal origin: Secondary | ICD-10-CM | POA: Diagnosis not present

## 2016-08-27 DIAGNOSIS — E8779 Other fluid overload: Secondary | ICD-10-CM | POA: Diagnosis not present

## 2016-08-27 DIAGNOSIS — D631 Anemia in chronic kidney disease: Secondary | ICD-10-CM | POA: Diagnosis not present

## 2016-08-27 DIAGNOSIS — N186 End stage renal disease: Secondary | ICD-10-CM | POA: Diagnosis not present

## 2016-08-30 DIAGNOSIS — N2581 Secondary hyperparathyroidism of renal origin: Secondary | ICD-10-CM | POA: Diagnosis not present

## 2016-08-30 DIAGNOSIS — E8779 Other fluid overload: Secondary | ICD-10-CM | POA: Diagnosis not present

## 2016-08-30 DIAGNOSIS — D631 Anemia in chronic kidney disease: Secondary | ICD-10-CM | POA: Diagnosis not present

## 2016-08-30 DIAGNOSIS — N186 End stage renal disease: Secondary | ICD-10-CM | POA: Diagnosis not present

## 2016-09-01 DIAGNOSIS — Z992 Dependence on renal dialysis: Secondary | ICD-10-CM | POA: Diagnosis not present

## 2016-09-01 DIAGNOSIS — E8779 Other fluid overload: Secondary | ICD-10-CM | POA: Diagnosis not present

## 2016-09-01 DIAGNOSIS — D631 Anemia in chronic kidney disease: Secondary | ICD-10-CM | POA: Diagnosis not present

## 2016-09-01 DIAGNOSIS — N186 End stage renal disease: Secondary | ICD-10-CM | POA: Diagnosis not present

## 2016-09-01 DIAGNOSIS — N2581 Secondary hyperparathyroidism of renal origin: Secondary | ICD-10-CM | POA: Diagnosis not present

## 2016-09-03 DIAGNOSIS — N2581 Secondary hyperparathyroidism of renal origin: Secondary | ICD-10-CM | POA: Diagnosis not present

## 2016-09-03 DIAGNOSIS — D631 Anemia in chronic kidney disease: Secondary | ICD-10-CM | POA: Diagnosis not present

## 2016-09-03 DIAGNOSIS — E8779 Other fluid overload: Secondary | ICD-10-CM | POA: Diagnosis not present

## 2016-09-03 DIAGNOSIS — N186 End stage renal disease: Secondary | ICD-10-CM | POA: Diagnosis not present

## 2016-09-06 DIAGNOSIS — N2581 Secondary hyperparathyroidism of renal origin: Secondary | ICD-10-CM | POA: Diagnosis not present

## 2016-09-06 DIAGNOSIS — E8779 Other fluid overload: Secondary | ICD-10-CM | POA: Diagnosis not present

## 2016-09-06 DIAGNOSIS — D631 Anemia in chronic kidney disease: Secondary | ICD-10-CM | POA: Diagnosis not present

## 2016-09-06 DIAGNOSIS — N186 End stage renal disease: Secondary | ICD-10-CM | POA: Diagnosis not present

## 2016-09-10 DIAGNOSIS — E8779 Other fluid overload: Secondary | ICD-10-CM | POA: Diagnosis not present

## 2016-09-10 DIAGNOSIS — N2581 Secondary hyperparathyroidism of renal origin: Secondary | ICD-10-CM | POA: Diagnosis not present

## 2016-09-10 DIAGNOSIS — D631 Anemia in chronic kidney disease: Secondary | ICD-10-CM | POA: Diagnosis not present

## 2016-09-10 DIAGNOSIS — N186 End stage renal disease: Secondary | ICD-10-CM | POA: Diagnosis not present

## 2016-09-13 DIAGNOSIS — E8779 Other fluid overload: Secondary | ICD-10-CM | POA: Diagnosis not present

## 2016-09-13 DIAGNOSIS — N2581 Secondary hyperparathyroidism of renal origin: Secondary | ICD-10-CM | POA: Diagnosis not present

## 2016-09-13 DIAGNOSIS — D631 Anemia in chronic kidney disease: Secondary | ICD-10-CM | POA: Diagnosis not present

## 2016-09-13 DIAGNOSIS — N186 End stage renal disease: Secondary | ICD-10-CM | POA: Diagnosis not present

## 2016-09-15 DIAGNOSIS — D631 Anemia in chronic kidney disease: Secondary | ICD-10-CM | POA: Diagnosis not present

## 2016-09-15 DIAGNOSIS — N2581 Secondary hyperparathyroidism of renal origin: Secondary | ICD-10-CM | POA: Diagnosis not present

## 2016-09-15 DIAGNOSIS — N186 End stage renal disease: Secondary | ICD-10-CM | POA: Diagnosis not present

## 2016-09-15 DIAGNOSIS — E8779 Other fluid overload: Secondary | ICD-10-CM | POA: Diagnosis not present

## 2016-09-17 DIAGNOSIS — D631 Anemia in chronic kidney disease: Secondary | ICD-10-CM | POA: Diagnosis not present

## 2016-09-17 DIAGNOSIS — N186 End stage renal disease: Secondary | ICD-10-CM | POA: Diagnosis not present

## 2016-09-17 DIAGNOSIS — N2581 Secondary hyperparathyroidism of renal origin: Secondary | ICD-10-CM | POA: Diagnosis not present

## 2016-09-17 DIAGNOSIS — E8779 Other fluid overload: Secondary | ICD-10-CM | POA: Diagnosis not present

## 2016-09-20 DIAGNOSIS — D631 Anemia in chronic kidney disease: Secondary | ICD-10-CM | POA: Diagnosis not present

## 2016-09-20 DIAGNOSIS — E8779 Other fluid overload: Secondary | ICD-10-CM | POA: Diagnosis not present

## 2016-09-20 DIAGNOSIS — N186 End stage renal disease: Secondary | ICD-10-CM | POA: Diagnosis not present

## 2016-09-20 DIAGNOSIS — N2581 Secondary hyperparathyroidism of renal origin: Secondary | ICD-10-CM | POA: Diagnosis not present

## 2016-09-22 DIAGNOSIS — N2581 Secondary hyperparathyroidism of renal origin: Secondary | ICD-10-CM | POA: Diagnosis not present

## 2016-09-22 DIAGNOSIS — N186 End stage renal disease: Secondary | ICD-10-CM | POA: Diagnosis not present

## 2016-09-22 DIAGNOSIS — E8779 Other fluid overload: Secondary | ICD-10-CM | POA: Diagnosis not present

## 2016-09-22 DIAGNOSIS — D631 Anemia in chronic kidney disease: Secondary | ICD-10-CM | POA: Diagnosis not present

## 2016-09-24 DIAGNOSIS — N186 End stage renal disease: Secondary | ICD-10-CM | POA: Diagnosis not present

## 2016-09-24 DIAGNOSIS — D631 Anemia in chronic kidney disease: Secondary | ICD-10-CM | POA: Diagnosis not present

## 2016-09-24 DIAGNOSIS — E8779 Other fluid overload: Secondary | ICD-10-CM | POA: Diagnosis not present

## 2016-09-24 DIAGNOSIS — N2581 Secondary hyperparathyroidism of renal origin: Secondary | ICD-10-CM | POA: Diagnosis not present

## 2016-09-27 DIAGNOSIS — N2581 Secondary hyperparathyroidism of renal origin: Secondary | ICD-10-CM | POA: Diagnosis not present

## 2016-09-27 DIAGNOSIS — D631 Anemia in chronic kidney disease: Secondary | ICD-10-CM | POA: Diagnosis not present

## 2016-09-27 DIAGNOSIS — N186 End stage renal disease: Secondary | ICD-10-CM | POA: Diagnosis not present

## 2016-09-27 DIAGNOSIS — E8779 Other fluid overload: Secondary | ICD-10-CM | POA: Diagnosis not present

## 2016-09-29 DIAGNOSIS — E8779 Other fluid overload: Secondary | ICD-10-CM | POA: Diagnosis not present

## 2016-09-29 DIAGNOSIS — D631 Anemia in chronic kidney disease: Secondary | ICD-10-CM | POA: Diagnosis not present

## 2016-09-29 DIAGNOSIS — N2581 Secondary hyperparathyroidism of renal origin: Secondary | ICD-10-CM | POA: Diagnosis not present

## 2016-09-29 DIAGNOSIS — N186 End stage renal disease: Secondary | ICD-10-CM | POA: Diagnosis not present

## 2016-09-30 DIAGNOSIS — N2581 Secondary hyperparathyroidism of renal origin: Secondary | ICD-10-CM | POA: Diagnosis not present

## 2016-09-30 DIAGNOSIS — N186 End stage renal disease: Secondary | ICD-10-CM | POA: Diagnosis not present

## 2016-09-30 DIAGNOSIS — D631 Anemia in chronic kidney disease: Secondary | ICD-10-CM | POA: Diagnosis not present

## 2016-09-30 DIAGNOSIS — E8779 Other fluid overload: Secondary | ICD-10-CM | POA: Diagnosis not present

## 2016-10-02 DIAGNOSIS — N186 End stage renal disease: Secondary | ICD-10-CM | POA: Diagnosis not present

## 2016-10-02 DIAGNOSIS — Z992 Dependence on renal dialysis: Secondary | ICD-10-CM | POA: Diagnosis not present

## 2016-10-04 DIAGNOSIS — D631 Anemia in chronic kidney disease: Secondary | ICD-10-CM | POA: Diagnosis not present

## 2016-10-04 DIAGNOSIS — N2581 Secondary hyperparathyroidism of renal origin: Secondary | ICD-10-CM | POA: Diagnosis not present

## 2016-10-04 DIAGNOSIS — N186 End stage renal disease: Secondary | ICD-10-CM | POA: Diagnosis not present

## 2016-10-06 DIAGNOSIS — N186 End stage renal disease: Secondary | ICD-10-CM | POA: Diagnosis not present

## 2016-10-06 DIAGNOSIS — D631 Anemia in chronic kidney disease: Secondary | ICD-10-CM | POA: Diagnosis not present

## 2016-10-06 DIAGNOSIS — N2581 Secondary hyperparathyroidism of renal origin: Secondary | ICD-10-CM | POA: Diagnosis not present

## 2016-10-08 DIAGNOSIS — D631 Anemia in chronic kidney disease: Secondary | ICD-10-CM | POA: Diagnosis not present

## 2016-10-08 DIAGNOSIS — N186 End stage renal disease: Secondary | ICD-10-CM | POA: Diagnosis not present

## 2016-10-08 DIAGNOSIS — N2581 Secondary hyperparathyroidism of renal origin: Secondary | ICD-10-CM | POA: Diagnosis not present

## 2016-10-11 DIAGNOSIS — D631 Anemia in chronic kidney disease: Secondary | ICD-10-CM | POA: Diagnosis not present

## 2016-10-11 DIAGNOSIS — N186 End stage renal disease: Secondary | ICD-10-CM | POA: Diagnosis not present

## 2016-10-11 DIAGNOSIS — N2581 Secondary hyperparathyroidism of renal origin: Secondary | ICD-10-CM | POA: Diagnosis not present

## 2016-10-13 DIAGNOSIS — D631 Anemia in chronic kidney disease: Secondary | ICD-10-CM | POA: Diagnosis not present

## 2016-10-13 DIAGNOSIS — N2581 Secondary hyperparathyroidism of renal origin: Secondary | ICD-10-CM | POA: Diagnosis not present

## 2016-10-13 DIAGNOSIS — N186 End stage renal disease: Secondary | ICD-10-CM | POA: Diagnosis not present

## 2016-10-15 DIAGNOSIS — N2581 Secondary hyperparathyroidism of renal origin: Secondary | ICD-10-CM | POA: Diagnosis not present

## 2016-10-15 DIAGNOSIS — N186 End stage renal disease: Secondary | ICD-10-CM | POA: Diagnosis not present

## 2016-10-15 DIAGNOSIS — D631 Anemia in chronic kidney disease: Secondary | ICD-10-CM | POA: Diagnosis not present

## 2016-10-18 DIAGNOSIS — D631 Anemia in chronic kidney disease: Secondary | ICD-10-CM | POA: Diagnosis not present

## 2016-10-18 DIAGNOSIS — N186 End stage renal disease: Secondary | ICD-10-CM | POA: Diagnosis not present

## 2016-10-18 DIAGNOSIS — N2581 Secondary hyperparathyroidism of renal origin: Secondary | ICD-10-CM | POA: Diagnosis not present

## 2016-10-20 DIAGNOSIS — D631 Anemia in chronic kidney disease: Secondary | ICD-10-CM | POA: Diagnosis not present

## 2016-10-20 DIAGNOSIS — N186 End stage renal disease: Secondary | ICD-10-CM | POA: Diagnosis not present

## 2016-10-20 DIAGNOSIS — N2581 Secondary hyperparathyroidism of renal origin: Secondary | ICD-10-CM | POA: Diagnosis not present

## 2016-10-22 DIAGNOSIS — D631 Anemia in chronic kidney disease: Secondary | ICD-10-CM | POA: Diagnosis not present

## 2016-10-22 DIAGNOSIS — N2581 Secondary hyperparathyroidism of renal origin: Secondary | ICD-10-CM | POA: Diagnosis not present

## 2016-10-22 DIAGNOSIS — N186 End stage renal disease: Secondary | ICD-10-CM | POA: Diagnosis not present

## 2016-10-25 DIAGNOSIS — D631 Anemia in chronic kidney disease: Secondary | ICD-10-CM | POA: Diagnosis not present

## 2016-10-25 DIAGNOSIS — N186 End stage renal disease: Secondary | ICD-10-CM | POA: Diagnosis not present

## 2016-10-25 DIAGNOSIS — N2581 Secondary hyperparathyroidism of renal origin: Secondary | ICD-10-CM | POA: Diagnosis not present

## 2016-10-27 DIAGNOSIS — D631 Anemia in chronic kidney disease: Secondary | ICD-10-CM | POA: Diagnosis not present

## 2016-10-27 DIAGNOSIS — N2581 Secondary hyperparathyroidism of renal origin: Secondary | ICD-10-CM | POA: Diagnosis not present

## 2016-10-27 DIAGNOSIS — N186 End stage renal disease: Secondary | ICD-10-CM | POA: Diagnosis not present

## 2016-10-29 DIAGNOSIS — D631 Anemia in chronic kidney disease: Secondary | ICD-10-CM | POA: Diagnosis not present

## 2016-10-29 DIAGNOSIS — N186 End stage renal disease: Secondary | ICD-10-CM | POA: Diagnosis not present

## 2016-10-29 DIAGNOSIS — N2581 Secondary hyperparathyroidism of renal origin: Secondary | ICD-10-CM | POA: Diagnosis not present

## 2016-11-01 DIAGNOSIS — D631 Anemia in chronic kidney disease: Secondary | ICD-10-CM | POA: Diagnosis not present

## 2016-11-01 DIAGNOSIS — N186 End stage renal disease: Secondary | ICD-10-CM | POA: Diagnosis not present

## 2016-11-01 DIAGNOSIS — Z992 Dependence on renal dialysis: Secondary | ICD-10-CM | POA: Diagnosis not present

## 2016-11-01 DIAGNOSIS — N2581 Secondary hyperparathyroidism of renal origin: Secondary | ICD-10-CM | POA: Diagnosis not present

## 2016-11-03 DIAGNOSIS — N186 End stage renal disease: Secondary | ICD-10-CM | POA: Diagnosis not present

## 2016-11-03 DIAGNOSIS — N2581 Secondary hyperparathyroidism of renal origin: Secondary | ICD-10-CM | POA: Diagnosis not present

## 2016-11-03 DIAGNOSIS — D631 Anemia in chronic kidney disease: Secondary | ICD-10-CM | POA: Diagnosis not present

## 2016-11-05 DIAGNOSIS — D631 Anemia in chronic kidney disease: Secondary | ICD-10-CM | POA: Diagnosis not present

## 2016-11-05 DIAGNOSIS — N186 End stage renal disease: Secondary | ICD-10-CM | POA: Diagnosis not present

## 2016-11-05 DIAGNOSIS — N2581 Secondary hyperparathyroidism of renal origin: Secondary | ICD-10-CM | POA: Diagnosis not present

## 2016-11-08 DIAGNOSIS — N2581 Secondary hyperparathyroidism of renal origin: Secondary | ICD-10-CM | POA: Diagnosis not present

## 2016-11-08 DIAGNOSIS — N186 End stage renal disease: Secondary | ICD-10-CM | POA: Diagnosis not present

## 2016-11-08 DIAGNOSIS — D631 Anemia in chronic kidney disease: Secondary | ICD-10-CM | POA: Diagnosis not present

## 2016-11-10 DIAGNOSIS — D631 Anemia in chronic kidney disease: Secondary | ICD-10-CM | POA: Diagnosis not present

## 2016-11-10 DIAGNOSIS — N2581 Secondary hyperparathyroidism of renal origin: Secondary | ICD-10-CM | POA: Diagnosis not present

## 2016-11-10 DIAGNOSIS — N186 End stage renal disease: Secondary | ICD-10-CM | POA: Diagnosis not present

## 2016-11-12 DIAGNOSIS — D631 Anemia in chronic kidney disease: Secondary | ICD-10-CM | POA: Diagnosis not present

## 2016-11-12 DIAGNOSIS — N2581 Secondary hyperparathyroidism of renal origin: Secondary | ICD-10-CM | POA: Diagnosis not present

## 2016-11-12 DIAGNOSIS — N186 End stage renal disease: Secondary | ICD-10-CM | POA: Diagnosis not present

## 2016-11-16 DIAGNOSIS — D631 Anemia in chronic kidney disease: Secondary | ICD-10-CM | POA: Diagnosis not present

## 2016-11-16 DIAGNOSIS — N2581 Secondary hyperparathyroidism of renal origin: Secondary | ICD-10-CM | POA: Diagnosis not present

## 2016-11-16 DIAGNOSIS — N186 End stage renal disease: Secondary | ICD-10-CM | POA: Diagnosis not present

## 2016-11-19 DIAGNOSIS — N186 End stage renal disease: Secondary | ICD-10-CM | POA: Diagnosis not present

## 2016-11-19 DIAGNOSIS — N2581 Secondary hyperparathyroidism of renal origin: Secondary | ICD-10-CM | POA: Diagnosis not present

## 2016-11-19 DIAGNOSIS — D631 Anemia in chronic kidney disease: Secondary | ICD-10-CM | POA: Diagnosis not present

## 2016-11-22 DIAGNOSIS — N186 End stage renal disease: Secondary | ICD-10-CM | POA: Diagnosis not present

## 2016-11-22 DIAGNOSIS — N2581 Secondary hyperparathyroidism of renal origin: Secondary | ICD-10-CM | POA: Diagnosis not present

## 2016-11-22 DIAGNOSIS — D631 Anemia in chronic kidney disease: Secondary | ICD-10-CM | POA: Diagnosis not present

## 2016-11-24 DIAGNOSIS — N2581 Secondary hyperparathyroidism of renal origin: Secondary | ICD-10-CM | POA: Diagnosis not present

## 2016-11-24 DIAGNOSIS — D631 Anemia in chronic kidney disease: Secondary | ICD-10-CM | POA: Diagnosis not present

## 2016-11-24 DIAGNOSIS — N186 End stage renal disease: Secondary | ICD-10-CM | POA: Diagnosis not present

## 2016-11-26 DIAGNOSIS — N2581 Secondary hyperparathyroidism of renal origin: Secondary | ICD-10-CM | POA: Diagnosis not present

## 2016-11-26 DIAGNOSIS — N186 End stage renal disease: Secondary | ICD-10-CM | POA: Diagnosis not present

## 2016-11-26 DIAGNOSIS — D631 Anemia in chronic kidney disease: Secondary | ICD-10-CM | POA: Diagnosis not present

## 2016-12-02 DIAGNOSIS — Z992 Dependence on renal dialysis: Secondary | ICD-10-CM | POA: Diagnosis not present

## 2016-12-02 DIAGNOSIS — N186 End stage renal disease: Secondary | ICD-10-CM | POA: Diagnosis not present

## 2016-12-02 DIAGNOSIS — N2581 Secondary hyperparathyroidism of renal origin: Secondary | ICD-10-CM | POA: Diagnosis not present

## 2016-12-02 DIAGNOSIS — D631 Anemia in chronic kidney disease: Secondary | ICD-10-CM | POA: Diagnosis not present

## 2016-12-03 DIAGNOSIS — N186 End stage renal disease: Secondary | ICD-10-CM | POA: Diagnosis not present

## 2016-12-03 DIAGNOSIS — N2581 Secondary hyperparathyroidism of renal origin: Secondary | ICD-10-CM | POA: Diagnosis not present

## 2016-12-03 DIAGNOSIS — D631 Anemia in chronic kidney disease: Secondary | ICD-10-CM | POA: Diagnosis not present

## 2016-12-07 DIAGNOSIS — N186 End stage renal disease: Secondary | ICD-10-CM | POA: Diagnosis not present

## 2016-12-07 DIAGNOSIS — N2581 Secondary hyperparathyroidism of renal origin: Secondary | ICD-10-CM | POA: Diagnosis not present

## 2016-12-07 DIAGNOSIS — D631 Anemia in chronic kidney disease: Secondary | ICD-10-CM | POA: Diagnosis not present

## 2016-12-08 DIAGNOSIS — D631 Anemia in chronic kidney disease: Secondary | ICD-10-CM | POA: Diagnosis not present

## 2016-12-08 DIAGNOSIS — N186 End stage renal disease: Secondary | ICD-10-CM | POA: Diagnosis not present

## 2016-12-08 DIAGNOSIS — N2581 Secondary hyperparathyroidism of renal origin: Secondary | ICD-10-CM | POA: Diagnosis not present

## 2016-12-13 DIAGNOSIS — D631 Anemia in chronic kidney disease: Secondary | ICD-10-CM | POA: Diagnosis not present

## 2016-12-13 DIAGNOSIS — N186 End stage renal disease: Secondary | ICD-10-CM | POA: Diagnosis not present

## 2016-12-13 DIAGNOSIS — N2581 Secondary hyperparathyroidism of renal origin: Secondary | ICD-10-CM | POA: Diagnosis not present

## 2016-12-15 DIAGNOSIS — N2581 Secondary hyperparathyroidism of renal origin: Secondary | ICD-10-CM | POA: Diagnosis not present

## 2016-12-15 DIAGNOSIS — N186 End stage renal disease: Secondary | ICD-10-CM | POA: Diagnosis not present

## 2016-12-15 DIAGNOSIS — D631 Anemia in chronic kidney disease: Secondary | ICD-10-CM | POA: Diagnosis not present

## 2016-12-17 DIAGNOSIS — N186 End stage renal disease: Secondary | ICD-10-CM | POA: Diagnosis not present

## 2016-12-17 DIAGNOSIS — N2581 Secondary hyperparathyroidism of renal origin: Secondary | ICD-10-CM | POA: Diagnosis not present

## 2016-12-17 DIAGNOSIS — D631 Anemia in chronic kidney disease: Secondary | ICD-10-CM | POA: Diagnosis not present

## 2016-12-21 DIAGNOSIS — I120 Hypertensive chronic kidney disease with stage 5 chronic kidney disease or end stage renal disease: Secondary | ICD-10-CM | POA: Diagnosis not present

## 2016-12-21 DIAGNOSIS — E0789 Other specified disorders of thyroid: Secondary | ICD-10-CM | POA: Diagnosis not present

## 2016-12-21 DIAGNOSIS — N186 End stage renal disease: Secondary | ICD-10-CM | POA: Diagnosis not present

## 2016-12-21 DIAGNOSIS — E212 Other hyperparathyroidism: Secondary | ICD-10-CM | POA: Diagnosis not present

## 2016-12-22 DIAGNOSIS — Z79899 Other long term (current) drug therapy: Secondary | ICD-10-CM | POA: Diagnosis not present

## 2016-12-22 DIAGNOSIS — E010 Iodine-deficiency related diffuse (endemic) goiter: Secondary | ICD-10-CM | POA: Diagnosis not present

## 2016-12-22 DIAGNOSIS — N2581 Secondary hyperparathyroidism of renal origin: Secondary | ICD-10-CM | POA: Diagnosis not present

## 2016-12-22 DIAGNOSIS — D631 Anemia in chronic kidney disease: Secondary | ICD-10-CM | POA: Diagnosis not present

## 2016-12-22 DIAGNOSIS — Z888 Allergy status to other drugs, medicaments and biological substances status: Secondary | ICD-10-CM | POA: Diagnosis not present

## 2016-12-22 DIAGNOSIS — I120 Hypertensive chronic kidney disease with stage 5 chronic kidney disease or end stage renal disease: Secondary | ICD-10-CM | POA: Diagnosis not present

## 2016-12-22 DIAGNOSIS — N186 End stage renal disease: Secondary | ICD-10-CM | POA: Diagnosis not present

## 2016-12-22 DIAGNOSIS — Z992 Dependence on renal dialysis: Secondary | ICD-10-CM | POA: Diagnosis not present

## 2016-12-24 DIAGNOSIS — N2581 Secondary hyperparathyroidism of renal origin: Secondary | ICD-10-CM | POA: Diagnosis not present

## 2016-12-24 DIAGNOSIS — N186 End stage renal disease: Secondary | ICD-10-CM | POA: Diagnosis not present

## 2016-12-24 DIAGNOSIS — D631 Anemia in chronic kidney disease: Secondary | ICD-10-CM | POA: Diagnosis not present

## 2016-12-27 DIAGNOSIS — N2581 Secondary hyperparathyroidism of renal origin: Secondary | ICD-10-CM | POA: Diagnosis not present

## 2016-12-27 DIAGNOSIS — D631 Anemia in chronic kidney disease: Secondary | ICD-10-CM | POA: Diagnosis not present

## 2016-12-27 DIAGNOSIS — N186 End stage renal disease: Secondary | ICD-10-CM | POA: Diagnosis not present

## 2016-12-29 DIAGNOSIS — D631 Anemia in chronic kidney disease: Secondary | ICD-10-CM | POA: Diagnosis not present

## 2016-12-29 DIAGNOSIS — N186 End stage renal disease: Secondary | ICD-10-CM | POA: Diagnosis not present

## 2016-12-29 DIAGNOSIS — N2581 Secondary hyperparathyroidism of renal origin: Secondary | ICD-10-CM | POA: Diagnosis not present

## 2016-12-30 DIAGNOSIS — E041 Nontoxic single thyroid nodule: Secondary | ICD-10-CM | POA: Diagnosis not present

## 2017-01-01 DIAGNOSIS — N186 End stage renal disease: Secondary | ICD-10-CM | POA: Diagnosis not present

## 2017-01-01 DIAGNOSIS — Z992 Dependence on renal dialysis: Secondary | ICD-10-CM | POA: Diagnosis not present

## 2017-01-03 DIAGNOSIS — N186 End stage renal disease: Secondary | ICD-10-CM | POA: Diagnosis not present

## 2017-01-07 DIAGNOSIS — N186 End stage renal disease: Secondary | ICD-10-CM | POA: Diagnosis not present

## 2017-01-07 DIAGNOSIS — D631 Anemia in chronic kidney disease: Secondary | ICD-10-CM | POA: Diagnosis not present

## 2017-01-07 DIAGNOSIS — N2581 Secondary hyperparathyroidism of renal origin: Secondary | ICD-10-CM | POA: Diagnosis not present

## 2017-01-10 DIAGNOSIS — N2581 Secondary hyperparathyroidism of renal origin: Secondary | ICD-10-CM | POA: Diagnosis not present

## 2017-01-10 DIAGNOSIS — D631 Anemia in chronic kidney disease: Secondary | ICD-10-CM | POA: Diagnosis not present

## 2017-01-10 DIAGNOSIS — N186 End stage renal disease: Secondary | ICD-10-CM | POA: Diagnosis not present

## 2017-01-12 DIAGNOSIS — N186 End stage renal disease: Secondary | ICD-10-CM | POA: Diagnosis not present

## 2017-01-12 DIAGNOSIS — D631 Anemia in chronic kidney disease: Secondary | ICD-10-CM | POA: Diagnosis not present

## 2017-01-12 DIAGNOSIS — N2581 Secondary hyperparathyroidism of renal origin: Secondary | ICD-10-CM | POA: Diagnosis not present

## 2017-01-14 DIAGNOSIS — N186 End stage renal disease: Secondary | ICD-10-CM | POA: Diagnosis not present

## 2017-01-14 DIAGNOSIS — E0789 Other specified disorders of thyroid: Secondary | ICD-10-CM | POA: Diagnosis not present

## 2017-01-17 DIAGNOSIS — N2581 Secondary hyperparathyroidism of renal origin: Secondary | ICD-10-CM | POA: Diagnosis not present

## 2017-01-17 DIAGNOSIS — N186 End stage renal disease: Secondary | ICD-10-CM | POA: Diagnosis not present

## 2017-01-17 DIAGNOSIS — D631 Anemia in chronic kidney disease: Secondary | ICD-10-CM | POA: Diagnosis not present

## 2017-01-19 DIAGNOSIS — D631 Anemia in chronic kidney disease: Secondary | ICD-10-CM | POA: Diagnosis not present

## 2017-01-19 DIAGNOSIS — N2581 Secondary hyperparathyroidism of renal origin: Secondary | ICD-10-CM | POA: Diagnosis not present

## 2017-01-19 DIAGNOSIS — N186 End stage renal disease: Secondary | ICD-10-CM | POA: Diagnosis not present

## 2017-01-24 DIAGNOSIS — N2581 Secondary hyperparathyroidism of renal origin: Secondary | ICD-10-CM | POA: Diagnosis not present

## 2017-01-24 DIAGNOSIS — D631 Anemia in chronic kidney disease: Secondary | ICD-10-CM | POA: Diagnosis not present

## 2017-01-24 DIAGNOSIS — N186 End stage renal disease: Secondary | ICD-10-CM | POA: Diagnosis not present

## 2017-01-26 DIAGNOSIS — N186 End stage renal disease: Secondary | ICD-10-CM | POA: Diagnosis not present

## 2017-01-26 DIAGNOSIS — N2581 Secondary hyperparathyroidism of renal origin: Secondary | ICD-10-CM | POA: Diagnosis not present

## 2017-01-26 DIAGNOSIS — D631 Anemia in chronic kidney disease: Secondary | ICD-10-CM | POA: Diagnosis not present

## 2017-01-28 DIAGNOSIS — N2581 Secondary hyperparathyroidism of renal origin: Secondary | ICD-10-CM | POA: Diagnosis not present

## 2017-01-28 DIAGNOSIS — N186 End stage renal disease: Secondary | ICD-10-CM | POA: Diagnosis not present

## 2017-01-28 DIAGNOSIS — D631 Anemia in chronic kidney disease: Secondary | ICD-10-CM | POA: Diagnosis not present

## 2017-02-01 DIAGNOSIS — Z992 Dependence on renal dialysis: Secondary | ICD-10-CM | POA: Diagnosis not present

## 2017-02-01 DIAGNOSIS — N186 End stage renal disease: Secondary | ICD-10-CM | POA: Diagnosis not present

## 2017-02-02 DIAGNOSIS — D631 Anemia in chronic kidney disease: Secondary | ICD-10-CM | POA: Diagnosis not present

## 2017-02-02 DIAGNOSIS — N2581 Secondary hyperparathyroidism of renal origin: Secondary | ICD-10-CM | POA: Diagnosis not present

## 2017-02-02 DIAGNOSIS — E8779 Other fluid overload: Secondary | ICD-10-CM | POA: Diagnosis not present

## 2017-02-02 DIAGNOSIS — N186 End stage renal disease: Secondary | ICD-10-CM | POA: Diagnosis not present

## 2017-02-07 DIAGNOSIS — E8779 Other fluid overload: Secondary | ICD-10-CM | POA: Diagnosis not present

## 2017-02-07 DIAGNOSIS — D631 Anemia in chronic kidney disease: Secondary | ICD-10-CM | POA: Diagnosis not present

## 2017-02-07 DIAGNOSIS — N2581 Secondary hyperparathyroidism of renal origin: Secondary | ICD-10-CM | POA: Diagnosis not present

## 2017-02-07 DIAGNOSIS — N186 End stage renal disease: Secondary | ICD-10-CM | POA: Diagnosis not present

## 2017-02-08 DIAGNOSIS — N2581 Secondary hyperparathyroidism of renal origin: Secondary | ICD-10-CM | POA: Diagnosis not present

## 2017-02-08 DIAGNOSIS — N186 End stage renal disease: Secondary | ICD-10-CM | POA: Diagnosis not present

## 2017-02-08 DIAGNOSIS — E8779 Other fluid overload: Secondary | ICD-10-CM | POA: Diagnosis not present

## 2017-02-08 DIAGNOSIS — D631 Anemia in chronic kidney disease: Secondary | ICD-10-CM | POA: Diagnosis not present

## 2017-02-09 DIAGNOSIS — D631 Anemia in chronic kidney disease: Secondary | ICD-10-CM | POA: Diagnosis not present

## 2017-02-09 DIAGNOSIS — N186 End stage renal disease: Secondary | ICD-10-CM | POA: Diagnosis not present

## 2017-02-09 DIAGNOSIS — E8779 Other fluid overload: Secondary | ICD-10-CM | POA: Diagnosis not present

## 2017-02-09 DIAGNOSIS — N2581 Secondary hyperparathyroidism of renal origin: Secondary | ICD-10-CM | POA: Diagnosis not present

## 2017-02-10 DIAGNOSIS — Z7682 Awaiting organ transplant status: Secondary | ICD-10-CM | POA: Diagnosis not present

## 2017-02-11 DIAGNOSIS — N2581 Secondary hyperparathyroidism of renal origin: Secondary | ICD-10-CM | POA: Diagnosis not present

## 2017-02-11 DIAGNOSIS — E8779 Other fluid overload: Secondary | ICD-10-CM | POA: Diagnosis not present

## 2017-02-11 DIAGNOSIS — N186 End stage renal disease: Secondary | ICD-10-CM | POA: Diagnosis not present

## 2017-02-11 DIAGNOSIS — D631 Anemia in chronic kidney disease: Secondary | ICD-10-CM | POA: Diagnosis not present

## 2017-02-15 DIAGNOSIS — E8779 Other fluid overload: Secondary | ICD-10-CM | POA: Diagnosis not present

## 2017-02-15 DIAGNOSIS — N186 End stage renal disease: Secondary | ICD-10-CM | POA: Diagnosis not present

## 2017-02-15 DIAGNOSIS — D631 Anemia in chronic kidney disease: Secondary | ICD-10-CM | POA: Diagnosis not present

## 2017-02-15 DIAGNOSIS — N2581 Secondary hyperparathyroidism of renal origin: Secondary | ICD-10-CM | POA: Diagnosis not present

## 2017-02-16 DIAGNOSIS — D631 Anemia in chronic kidney disease: Secondary | ICD-10-CM | POA: Diagnosis not present

## 2017-02-16 DIAGNOSIS — N2581 Secondary hyperparathyroidism of renal origin: Secondary | ICD-10-CM | POA: Diagnosis not present

## 2017-02-16 DIAGNOSIS — E8779 Other fluid overload: Secondary | ICD-10-CM | POA: Diagnosis not present

## 2017-02-16 DIAGNOSIS — N186 End stage renal disease: Secondary | ICD-10-CM | POA: Diagnosis not present

## 2017-02-21 DIAGNOSIS — E8779 Other fluid overload: Secondary | ICD-10-CM | POA: Diagnosis not present

## 2017-02-21 DIAGNOSIS — N2581 Secondary hyperparathyroidism of renal origin: Secondary | ICD-10-CM | POA: Diagnosis not present

## 2017-02-21 DIAGNOSIS — D631 Anemia in chronic kidney disease: Secondary | ICD-10-CM | POA: Diagnosis not present

## 2017-02-21 DIAGNOSIS — N186 End stage renal disease: Secondary | ICD-10-CM | POA: Diagnosis not present

## 2017-02-23 DIAGNOSIS — N2581 Secondary hyperparathyroidism of renal origin: Secondary | ICD-10-CM | POA: Diagnosis not present

## 2017-02-23 DIAGNOSIS — D631 Anemia in chronic kidney disease: Secondary | ICD-10-CM | POA: Diagnosis not present

## 2017-02-23 DIAGNOSIS — E8779 Other fluid overload: Secondary | ICD-10-CM | POA: Diagnosis not present

## 2017-02-23 DIAGNOSIS — N186 End stage renal disease: Secondary | ICD-10-CM | POA: Diagnosis not present

## 2017-02-28 DIAGNOSIS — E8779 Other fluid overload: Secondary | ICD-10-CM | POA: Diagnosis not present

## 2017-02-28 DIAGNOSIS — N186 End stage renal disease: Secondary | ICD-10-CM | POA: Diagnosis not present

## 2017-02-28 DIAGNOSIS — N2581 Secondary hyperparathyroidism of renal origin: Secondary | ICD-10-CM | POA: Diagnosis not present

## 2017-02-28 DIAGNOSIS — D631 Anemia in chronic kidney disease: Secondary | ICD-10-CM | POA: Diagnosis not present

## 2017-03-02 DIAGNOSIS — N186 End stage renal disease: Secondary | ICD-10-CM | POA: Diagnosis not present

## 2017-03-02 DIAGNOSIS — E8779 Other fluid overload: Secondary | ICD-10-CM | POA: Diagnosis not present

## 2017-03-02 DIAGNOSIS — D631 Anemia in chronic kidney disease: Secondary | ICD-10-CM | POA: Diagnosis not present

## 2017-03-02 DIAGNOSIS — N2581 Secondary hyperparathyroidism of renal origin: Secondary | ICD-10-CM | POA: Diagnosis not present

## 2017-03-04 DIAGNOSIS — Z992 Dependence on renal dialysis: Secondary | ICD-10-CM | POA: Diagnosis not present

## 2017-03-04 DIAGNOSIS — N186 End stage renal disease: Secondary | ICD-10-CM | POA: Diagnosis not present

## 2017-03-07 DIAGNOSIS — D631 Anemia in chronic kidney disease: Secondary | ICD-10-CM | POA: Diagnosis not present

## 2017-03-07 DIAGNOSIS — N186 End stage renal disease: Secondary | ICD-10-CM | POA: Diagnosis not present

## 2017-03-07 DIAGNOSIS — N2581 Secondary hyperparathyroidism of renal origin: Secondary | ICD-10-CM | POA: Diagnosis not present

## 2017-03-09 DIAGNOSIS — N2581 Secondary hyperparathyroidism of renal origin: Secondary | ICD-10-CM | POA: Diagnosis not present

## 2017-03-09 DIAGNOSIS — D631 Anemia in chronic kidney disease: Secondary | ICD-10-CM | POA: Diagnosis not present

## 2017-03-09 DIAGNOSIS — N186 End stage renal disease: Secondary | ICD-10-CM | POA: Diagnosis not present

## 2017-03-14 DIAGNOSIS — N2581 Secondary hyperparathyroidism of renal origin: Secondary | ICD-10-CM | POA: Diagnosis not present

## 2017-03-14 DIAGNOSIS — N186 End stage renal disease: Secondary | ICD-10-CM | POA: Diagnosis not present

## 2017-03-14 DIAGNOSIS — D631 Anemia in chronic kidney disease: Secondary | ICD-10-CM | POA: Diagnosis not present

## 2017-03-17 DIAGNOSIS — D631 Anemia in chronic kidney disease: Secondary | ICD-10-CM | POA: Diagnosis not present

## 2017-03-17 DIAGNOSIS — N186 End stage renal disease: Secondary | ICD-10-CM | POA: Diagnosis not present

## 2017-03-17 DIAGNOSIS — N2581 Secondary hyperparathyroidism of renal origin: Secondary | ICD-10-CM | POA: Diagnosis not present

## 2017-03-21 DIAGNOSIS — N2581 Secondary hyperparathyroidism of renal origin: Secondary | ICD-10-CM | POA: Diagnosis not present

## 2017-03-21 DIAGNOSIS — D631 Anemia in chronic kidney disease: Secondary | ICD-10-CM | POA: Diagnosis not present

## 2017-03-21 DIAGNOSIS — N186 End stage renal disease: Secondary | ICD-10-CM | POA: Diagnosis not present

## 2017-03-23 DIAGNOSIS — D631 Anemia in chronic kidney disease: Secondary | ICD-10-CM | POA: Diagnosis not present

## 2017-03-23 DIAGNOSIS — N2581 Secondary hyperparathyroidism of renal origin: Secondary | ICD-10-CM | POA: Diagnosis not present

## 2017-03-23 DIAGNOSIS — N186 End stage renal disease: Secondary | ICD-10-CM | POA: Diagnosis not present

## 2017-03-24 DIAGNOSIS — N186 End stage renal disease: Secondary | ICD-10-CM | POA: Diagnosis not present

## 2017-03-24 DIAGNOSIS — Z681 Body mass index (BMI) 19 or less, adult: Secondary | ICD-10-CM | POA: Diagnosis not present

## 2017-03-24 DIAGNOSIS — E041 Nontoxic single thyroid nodule: Secondary | ICD-10-CM | POA: Diagnosis not present

## 2017-03-25 DIAGNOSIS — N186 End stage renal disease: Secondary | ICD-10-CM | POA: Diagnosis not present

## 2017-03-25 DIAGNOSIS — D631 Anemia in chronic kidney disease: Secondary | ICD-10-CM | POA: Diagnosis not present

## 2017-03-25 DIAGNOSIS — N2581 Secondary hyperparathyroidism of renal origin: Secondary | ICD-10-CM | POA: Diagnosis not present

## 2017-03-28 DIAGNOSIS — N2581 Secondary hyperparathyroidism of renal origin: Secondary | ICD-10-CM | POA: Diagnosis not present

## 2017-03-28 DIAGNOSIS — N186 End stage renal disease: Secondary | ICD-10-CM | POA: Diagnosis not present

## 2017-03-28 DIAGNOSIS — D631 Anemia in chronic kidney disease: Secondary | ICD-10-CM | POA: Diagnosis not present

## 2017-03-30 DIAGNOSIS — N2581 Secondary hyperparathyroidism of renal origin: Secondary | ICD-10-CM | POA: Diagnosis not present

## 2017-03-30 DIAGNOSIS — N186 End stage renal disease: Secondary | ICD-10-CM | POA: Diagnosis not present

## 2017-03-30 DIAGNOSIS — D631 Anemia in chronic kidney disease: Secondary | ICD-10-CM | POA: Diagnosis not present

## 2017-04-01 DIAGNOSIS — N186 End stage renal disease: Secondary | ICD-10-CM | POA: Diagnosis not present

## 2017-04-01 DIAGNOSIS — N2581 Secondary hyperparathyroidism of renal origin: Secondary | ICD-10-CM | POA: Diagnosis not present

## 2017-04-01 DIAGNOSIS — D631 Anemia in chronic kidney disease: Secondary | ICD-10-CM | POA: Diagnosis not present

## 2017-04-03 DIAGNOSIS — Z992 Dependence on renal dialysis: Secondary | ICD-10-CM | POA: Diagnosis not present

## 2017-04-03 DIAGNOSIS — N186 End stage renal disease: Secondary | ICD-10-CM | POA: Diagnosis not present

## 2017-04-04 DIAGNOSIS — D631 Anemia in chronic kidney disease: Secondary | ICD-10-CM | POA: Diagnosis not present

## 2017-04-04 DIAGNOSIS — N2581 Secondary hyperparathyroidism of renal origin: Secondary | ICD-10-CM | POA: Diagnosis not present

## 2017-04-04 DIAGNOSIS — N186 End stage renal disease: Secondary | ICD-10-CM | POA: Diagnosis not present

## 2017-04-06 DIAGNOSIS — N186 End stage renal disease: Secondary | ICD-10-CM | POA: Diagnosis not present

## 2017-04-06 DIAGNOSIS — D631 Anemia in chronic kidney disease: Secondary | ICD-10-CM | POA: Diagnosis not present

## 2017-04-06 DIAGNOSIS — N2581 Secondary hyperparathyroidism of renal origin: Secondary | ICD-10-CM | POA: Diagnosis not present

## 2017-04-11 DIAGNOSIS — D631 Anemia in chronic kidney disease: Secondary | ICD-10-CM | POA: Diagnosis not present

## 2017-04-11 DIAGNOSIS — N186 End stage renal disease: Secondary | ICD-10-CM | POA: Diagnosis not present

## 2017-04-11 DIAGNOSIS — N2581 Secondary hyperparathyroidism of renal origin: Secondary | ICD-10-CM | POA: Diagnosis not present

## 2017-04-12 DIAGNOSIS — C679 Malignant neoplasm of bladder, unspecified: Secondary | ICD-10-CM | POA: Diagnosis not present

## 2017-04-12 DIAGNOSIS — Z01818 Encounter for other preprocedural examination: Secondary | ICD-10-CM | POA: Diagnosis not present

## 2017-04-12 DIAGNOSIS — Z8551 Personal history of malignant neoplasm of bladder: Secondary | ICD-10-CM | POA: Diagnosis not present

## 2017-04-13 DIAGNOSIS — D631 Anemia in chronic kidney disease: Secondary | ICD-10-CM | POA: Diagnosis not present

## 2017-04-13 DIAGNOSIS — N2581 Secondary hyperparathyroidism of renal origin: Secondary | ICD-10-CM | POA: Diagnosis not present

## 2017-04-13 DIAGNOSIS — N186 End stage renal disease: Secondary | ICD-10-CM | POA: Diagnosis not present

## 2017-04-15 DIAGNOSIS — N186 End stage renal disease: Secondary | ICD-10-CM | POA: Diagnosis not present

## 2017-04-15 DIAGNOSIS — D631 Anemia in chronic kidney disease: Secondary | ICD-10-CM | POA: Diagnosis not present

## 2017-04-15 DIAGNOSIS — N2581 Secondary hyperparathyroidism of renal origin: Secondary | ICD-10-CM | POA: Diagnosis not present

## 2017-04-18 DIAGNOSIS — D631 Anemia in chronic kidney disease: Secondary | ICD-10-CM | POA: Diagnosis not present

## 2017-04-18 DIAGNOSIS — N2581 Secondary hyperparathyroidism of renal origin: Secondary | ICD-10-CM | POA: Diagnosis not present

## 2017-04-18 DIAGNOSIS — N186 End stage renal disease: Secondary | ICD-10-CM | POA: Diagnosis not present

## 2017-04-20 DIAGNOSIS — D631 Anemia in chronic kidney disease: Secondary | ICD-10-CM | POA: Diagnosis not present

## 2017-04-20 DIAGNOSIS — N2581 Secondary hyperparathyroidism of renal origin: Secondary | ICD-10-CM | POA: Diagnosis not present

## 2017-04-20 DIAGNOSIS — N186 End stage renal disease: Secondary | ICD-10-CM | POA: Diagnosis not present

## 2017-04-25 DIAGNOSIS — N186 End stage renal disease: Secondary | ICD-10-CM | POA: Diagnosis not present

## 2017-04-25 DIAGNOSIS — Z114 Encounter for screening for human immunodeficiency virus [HIV]: Secondary | ICD-10-CM | POA: Diagnosis not present

## 2017-04-25 DIAGNOSIS — N2581 Secondary hyperparathyroidism of renal origin: Secondary | ICD-10-CM | POA: Diagnosis not present

## 2017-04-25 DIAGNOSIS — Z1159 Encounter for screening for other viral diseases: Secondary | ICD-10-CM | POA: Diagnosis not present

## 2017-04-25 DIAGNOSIS — D631 Anemia in chronic kidney disease: Secondary | ICD-10-CM | POA: Diagnosis not present

## 2017-04-27 DIAGNOSIS — D631 Anemia in chronic kidney disease: Secondary | ICD-10-CM | POA: Diagnosis not present

## 2017-04-27 DIAGNOSIS — N2581 Secondary hyperparathyroidism of renal origin: Secondary | ICD-10-CM | POA: Diagnosis not present

## 2017-04-27 DIAGNOSIS — N186 End stage renal disease: Secondary | ICD-10-CM | POA: Diagnosis not present

## 2017-04-29 DIAGNOSIS — N186 End stage renal disease: Secondary | ICD-10-CM | POA: Diagnosis not present

## 2017-04-29 DIAGNOSIS — D631 Anemia in chronic kidney disease: Secondary | ICD-10-CM | POA: Diagnosis not present

## 2017-04-29 DIAGNOSIS — N2581 Secondary hyperparathyroidism of renal origin: Secondary | ICD-10-CM | POA: Diagnosis not present

## 2017-05-02 DIAGNOSIS — D631 Anemia in chronic kidney disease: Secondary | ICD-10-CM | POA: Diagnosis not present

## 2017-05-02 DIAGNOSIS — N2581 Secondary hyperparathyroidism of renal origin: Secondary | ICD-10-CM | POA: Diagnosis not present

## 2017-05-02 DIAGNOSIS — N186 End stage renal disease: Secondary | ICD-10-CM | POA: Diagnosis not present

## 2017-05-04 DIAGNOSIS — Z992 Dependence on renal dialysis: Secondary | ICD-10-CM | POA: Diagnosis not present

## 2017-05-04 DIAGNOSIS — N2581 Secondary hyperparathyroidism of renal origin: Secondary | ICD-10-CM | POA: Diagnosis not present

## 2017-05-04 DIAGNOSIS — D631 Anemia in chronic kidney disease: Secondary | ICD-10-CM | POA: Diagnosis not present

## 2017-05-04 DIAGNOSIS — N186 End stage renal disease: Secondary | ICD-10-CM | POA: Diagnosis not present

## 2017-05-09 DIAGNOSIS — N2581 Secondary hyperparathyroidism of renal origin: Secondary | ICD-10-CM | POA: Diagnosis not present

## 2017-05-09 DIAGNOSIS — E8779 Other fluid overload: Secondary | ICD-10-CM | POA: Diagnosis not present

## 2017-05-09 DIAGNOSIS — N186 End stage renal disease: Secondary | ICD-10-CM | POA: Diagnosis not present

## 2017-05-09 DIAGNOSIS — D631 Anemia in chronic kidney disease: Secondary | ICD-10-CM | POA: Diagnosis not present

## 2017-05-11 DIAGNOSIS — D631 Anemia in chronic kidney disease: Secondary | ICD-10-CM | POA: Diagnosis not present

## 2017-05-11 DIAGNOSIS — E8779 Other fluid overload: Secondary | ICD-10-CM | POA: Diagnosis not present

## 2017-05-11 DIAGNOSIS — N186 End stage renal disease: Secondary | ICD-10-CM | POA: Diagnosis not present

## 2017-05-11 DIAGNOSIS — N2581 Secondary hyperparathyroidism of renal origin: Secondary | ICD-10-CM | POA: Diagnosis not present

## 2017-05-16 DIAGNOSIS — D631 Anemia in chronic kidney disease: Secondary | ICD-10-CM | POA: Diagnosis not present

## 2017-05-16 DIAGNOSIS — N186 End stage renal disease: Secondary | ICD-10-CM | POA: Diagnosis not present

## 2017-05-16 DIAGNOSIS — E8779 Other fluid overload: Secondary | ICD-10-CM | POA: Diagnosis not present

## 2017-05-16 DIAGNOSIS — N2581 Secondary hyperparathyroidism of renal origin: Secondary | ICD-10-CM | POA: Diagnosis not present

## 2017-05-18 DIAGNOSIS — D631 Anemia in chronic kidney disease: Secondary | ICD-10-CM | POA: Diagnosis not present

## 2017-05-18 DIAGNOSIS — N186 End stage renal disease: Secondary | ICD-10-CM | POA: Diagnosis not present

## 2017-05-18 DIAGNOSIS — E8779 Other fluid overload: Secondary | ICD-10-CM | POA: Diagnosis not present

## 2017-05-18 DIAGNOSIS — N2581 Secondary hyperparathyroidism of renal origin: Secondary | ICD-10-CM | POA: Diagnosis not present

## 2017-05-20 DIAGNOSIS — E8779 Other fluid overload: Secondary | ICD-10-CM | POA: Diagnosis not present

## 2017-05-20 DIAGNOSIS — N186 End stage renal disease: Secondary | ICD-10-CM | POA: Diagnosis not present

## 2017-05-20 DIAGNOSIS — N2581 Secondary hyperparathyroidism of renal origin: Secondary | ICD-10-CM | POA: Diagnosis not present

## 2017-05-20 DIAGNOSIS — D631 Anemia in chronic kidney disease: Secondary | ICD-10-CM | POA: Diagnosis not present

## 2017-05-22 DIAGNOSIS — D631 Anemia in chronic kidney disease: Secondary | ICD-10-CM | POA: Diagnosis not present

## 2017-05-22 DIAGNOSIS — N186 End stage renal disease: Secondary | ICD-10-CM | POA: Diagnosis not present

## 2017-05-22 DIAGNOSIS — N2581 Secondary hyperparathyroidism of renal origin: Secondary | ICD-10-CM | POA: Diagnosis not present

## 2017-05-22 DIAGNOSIS — E8779 Other fluid overload: Secondary | ICD-10-CM | POA: Diagnosis not present

## 2017-05-24 DIAGNOSIS — Z79891 Long term (current) use of opiate analgesic: Secondary | ICD-10-CM | POA: Diagnosis not present

## 2017-05-24 DIAGNOSIS — G894 Chronic pain syndrome: Secondary | ICD-10-CM | POA: Diagnosis not present

## 2017-05-24 DIAGNOSIS — M87021 Idiopathic aseptic necrosis of right humerus: Secondary | ICD-10-CM | POA: Diagnosis not present

## 2017-05-24 DIAGNOSIS — M87022 Idiopathic aseptic necrosis of left humerus: Secondary | ICD-10-CM | POA: Diagnosis not present

## 2017-05-27 DIAGNOSIS — D631 Anemia in chronic kidney disease: Secondary | ICD-10-CM | POA: Diagnosis not present

## 2017-05-27 DIAGNOSIS — N2581 Secondary hyperparathyroidism of renal origin: Secondary | ICD-10-CM | POA: Diagnosis not present

## 2017-05-27 DIAGNOSIS — E8779 Other fluid overload: Secondary | ICD-10-CM | POA: Diagnosis not present

## 2017-05-27 DIAGNOSIS — N186 End stage renal disease: Secondary | ICD-10-CM | POA: Diagnosis not present

## 2017-05-30 DIAGNOSIS — N2581 Secondary hyperparathyroidism of renal origin: Secondary | ICD-10-CM | POA: Diagnosis not present

## 2017-05-30 DIAGNOSIS — D631 Anemia in chronic kidney disease: Secondary | ICD-10-CM | POA: Diagnosis not present

## 2017-05-30 DIAGNOSIS — E8779 Other fluid overload: Secondary | ICD-10-CM | POA: Diagnosis not present

## 2017-05-30 DIAGNOSIS — N186 End stage renal disease: Secondary | ICD-10-CM | POA: Diagnosis not present

## 2017-05-31 DIAGNOSIS — R011 Cardiac murmur, unspecified: Secondary | ICD-10-CM | POA: Diagnosis not present

## 2017-05-31 DIAGNOSIS — I079 Rheumatic tricuspid valve disease, unspecified: Secondary | ICD-10-CM | POA: Diagnosis not present

## 2017-05-31 DIAGNOSIS — D631 Anemia in chronic kidney disease: Secondary | ICD-10-CM | POA: Diagnosis not present

## 2017-05-31 DIAGNOSIS — N186 End stage renal disease: Secondary | ICD-10-CM | POA: Diagnosis not present

## 2017-05-31 DIAGNOSIS — N2581 Secondary hyperparathyroidism of renal origin: Secondary | ICD-10-CM | POA: Diagnosis not present

## 2017-05-31 DIAGNOSIS — I120 Hypertensive chronic kidney disease with stage 5 chronic kidney disease or end stage renal disease: Secondary | ICD-10-CM | POA: Diagnosis not present

## 2017-05-31 DIAGNOSIS — Z992 Dependence on renal dialysis: Secondary | ICD-10-CM | POA: Diagnosis not present

## 2017-05-31 DIAGNOSIS — E8779 Other fluid overload: Secondary | ICD-10-CM | POA: Diagnosis not present

## 2017-06-01 DIAGNOSIS — E8779 Other fluid overload: Secondary | ICD-10-CM | POA: Diagnosis not present

## 2017-06-01 DIAGNOSIS — N186 End stage renal disease: Secondary | ICD-10-CM | POA: Diagnosis not present

## 2017-06-01 DIAGNOSIS — N2581 Secondary hyperparathyroidism of renal origin: Secondary | ICD-10-CM | POA: Diagnosis not present

## 2017-06-01 DIAGNOSIS — D631 Anemia in chronic kidney disease: Secondary | ICD-10-CM | POA: Diagnosis not present

## 2017-06-03 DIAGNOSIS — N186 End stage renal disease: Secondary | ICD-10-CM | POA: Diagnosis not present

## 2017-06-03 DIAGNOSIS — Z992 Dependence on renal dialysis: Secondary | ICD-10-CM | POA: Diagnosis not present

## 2017-06-06 DIAGNOSIS — N2581 Secondary hyperparathyroidism of renal origin: Secondary | ICD-10-CM | POA: Diagnosis not present

## 2017-06-06 DIAGNOSIS — N186 End stage renal disease: Secondary | ICD-10-CM | POA: Diagnosis not present

## 2017-06-06 DIAGNOSIS — D631 Anemia in chronic kidney disease: Secondary | ICD-10-CM | POA: Diagnosis not present

## 2017-06-08 DIAGNOSIS — D631 Anemia in chronic kidney disease: Secondary | ICD-10-CM | POA: Diagnosis not present

## 2017-06-08 DIAGNOSIS — N186 End stage renal disease: Secondary | ICD-10-CM | POA: Diagnosis not present

## 2017-06-08 DIAGNOSIS — N2581 Secondary hyperparathyroidism of renal origin: Secondary | ICD-10-CM | POA: Diagnosis not present

## 2017-06-10 DIAGNOSIS — N2581 Secondary hyperparathyroidism of renal origin: Secondary | ICD-10-CM | POA: Diagnosis not present

## 2017-06-10 DIAGNOSIS — N186 End stage renal disease: Secondary | ICD-10-CM | POA: Diagnosis not present

## 2017-06-10 DIAGNOSIS — D631 Anemia in chronic kidney disease: Secondary | ICD-10-CM | POA: Diagnosis not present

## 2017-06-15 DIAGNOSIS — N186 End stage renal disease: Secondary | ICD-10-CM | POA: Diagnosis not present

## 2017-06-15 DIAGNOSIS — D631 Anemia in chronic kidney disease: Secondary | ICD-10-CM | POA: Diagnosis not present

## 2017-06-15 DIAGNOSIS — N2581 Secondary hyperparathyroidism of renal origin: Secondary | ICD-10-CM | POA: Diagnosis not present

## 2017-06-17 DIAGNOSIS — N186 End stage renal disease: Secondary | ICD-10-CM | POA: Diagnosis not present

## 2017-06-17 DIAGNOSIS — N2581 Secondary hyperparathyroidism of renal origin: Secondary | ICD-10-CM | POA: Diagnosis not present

## 2017-06-17 DIAGNOSIS — D631 Anemia in chronic kidney disease: Secondary | ICD-10-CM | POA: Diagnosis not present

## 2017-06-20 DIAGNOSIS — D631 Anemia in chronic kidney disease: Secondary | ICD-10-CM | POA: Diagnosis not present

## 2017-06-20 DIAGNOSIS — N2581 Secondary hyperparathyroidism of renal origin: Secondary | ICD-10-CM | POA: Diagnosis not present

## 2017-06-20 DIAGNOSIS — N186 End stage renal disease: Secondary | ICD-10-CM | POA: Diagnosis not present

## 2017-06-22 DIAGNOSIS — N186 End stage renal disease: Secondary | ICD-10-CM | POA: Diagnosis not present

## 2017-06-22 DIAGNOSIS — N2581 Secondary hyperparathyroidism of renal origin: Secondary | ICD-10-CM | POA: Diagnosis not present

## 2017-06-22 DIAGNOSIS — D631 Anemia in chronic kidney disease: Secondary | ICD-10-CM | POA: Diagnosis not present

## 2017-06-29 DIAGNOSIS — N186 End stage renal disease: Secondary | ICD-10-CM | POA: Diagnosis not present

## 2017-06-29 DIAGNOSIS — D631 Anemia in chronic kidney disease: Secondary | ICD-10-CM | POA: Diagnosis not present

## 2017-06-29 DIAGNOSIS — N2581 Secondary hyperparathyroidism of renal origin: Secondary | ICD-10-CM | POA: Diagnosis not present

## 2017-07-01 DIAGNOSIS — D631 Anemia in chronic kidney disease: Secondary | ICD-10-CM | POA: Diagnosis not present

## 2017-07-01 DIAGNOSIS — N186 End stage renal disease: Secondary | ICD-10-CM | POA: Diagnosis not present

## 2017-07-01 DIAGNOSIS — N2581 Secondary hyperparathyroidism of renal origin: Secondary | ICD-10-CM | POA: Diagnosis not present

## 2017-07-04 DIAGNOSIS — N186 End stage renal disease: Secondary | ICD-10-CM | POA: Diagnosis not present

## 2017-07-04 DIAGNOSIS — N2581 Secondary hyperparathyroidism of renal origin: Secondary | ICD-10-CM | POA: Diagnosis not present

## 2017-07-04 DIAGNOSIS — D631 Anemia in chronic kidney disease: Secondary | ICD-10-CM | POA: Diagnosis not present

## 2017-07-04 DIAGNOSIS — Z992 Dependence on renal dialysis: Secondary | ICD-10-CM | POA: Diagnosis not present

## 2017-10-06 ENCOUNTER — Telehealth: Payer: Self-pay | Admitting: Cardiology

## 2017-10-06 NOTE — Telephone Encounter (Signed)
This patient has not been seen by our practice?

## 2017-10-06 NOTE — Telephone Encounter (Signed)
Please call patient, having dental surgery soon and blood pressure is fluctuating
